# Patient Record
Sex: Male | Born: 1935 | Race: White | Hispanic: No | Smoking: Former smoker
Health system: Southern US, Community
[De-identification: ages and names within clinical notes are randomized; demographics above are authoritative.]

## PROBLEM LIST (undated history)

## (undated) DIAGNOSIS — Z9889 Other specified postprocedural states: Secondary | ICD-10-CM

## (undated) DIAGNOSIS — K5900 Constipation, unspecified: Secondary | ICD-10-CM

## (undated) DIAGNOSIS — C801 Malignant (primary) neoplasm, unspecified: Secondary | ICD-10-CM

## (undated) DIAGNOSIS — Z9989 Dependence on other enabling machines and devices: Secondary | ICD-10-CM

## (undated) DIAGNOSIS — N183 Chronic kidney disease, stage 3 unspecified: Secondary | ICD-10-CM

## (undated) DIAGNOSIS — M4804 Spinal stenosis, thoracic region: Secondary | ICD-10-CM

## (undated) DIAGNOSIS — I340 Nonrheumatic mitral (valve) insufficiency: Secondary | ICD-10-CM

## (undated) DIAGNOSIS — I714 Abdominal aortic aneurysm, without rupture: Secondary | ICD-10-CM

## (undated) DIAGNOSIS — I5032 Chronic diastolic (congestive) heart failure: Secondary | ICD-10-CM

## (undated) DIAGNOSIS — Z8679 Personal history of other diseases of the circulatory system: Secondary | ICD-10-CM

## (undated) DIAGNOSIS — Z87828 Personal history of other (healed) physical injury and trauma: Secondary | ICD-10-CM

## (undated) DIAGNOSIS — I341 Nonrheumatic mitral (valve) prolapse: Secondary | ICD-10-CM

## (undated) DIAGNOSIS — G4733 Obstructive sleep apnea (adult) (pediatric): Secondary | ICD-10-CM

## (undated) DIAGNOSIS — I4891 Unspecified atrial fibrillation: Secondary | ICD-10-CM

## (undated) DIAGNOSIS — H9190 Unspecified hearing loss, unspecified ear: Secondary | ICD-10-CM

## (undated) DIAGNOSIS — M1990 Unspecified osteoarthritis, unspecified site: Secondary | ICD-10-CM

## (undated) DIAGNOSIS — N529 Male erectile dysfunction, unspecified: Secondary | ICD-10-CM

## (undated) HISTORY — DX: Chronic kidney disease, stage 3 (moderate): N18.3

## (undated) HISTORY — DX: Chronic kidney disease, stage 3 unspecified: N18.30

## (undated) HISTORY — DX: Dependence on other enabling machines and devices: Z99.89

## (undated) HISTORY — DX: Nonrheumatic mitral (valve) prolapse: I34.1

## (undated) HISTORY — DX: Nonrheumatic mitral (valve) insufficiency: I34.0

## (undated) HISTORY — DX: Abdominal aortic aneurysm, without rupture: I71.4

## (undated) HISTORY — DX: Unspecified atrial fibrillation: I48.91

## (undated) HISTORY — PX: HEMORRHOID SURGERY: SHX153

## (undated) HISTORY — PX: CARPAL TUNNEL RELEASE: SHX101

## (undated) HISTORY — DX: Obstructive sleep apnea (adult) (pediatric): G47.33

## (undated) HISTORY — DX: Spinal stenosis, thoracic region: M48.04

## (undated) HISTORY — DX: Male erectile dysfunction, unspecified: N52.9

## (undated) HISTORY — PX: OTHER SURGICAL HISTORY: SHX169

---

## 2001-06-01 ENCOUNTER — Ambulatory Visit (HOSPITAL_COMMUNITY): Admission: RE | Admit: 2001-06-01 | Discharge: 2001-06-01 | Payer: Self-pay | Admitting: Gastroenterology

## 2004-07-09 ENCOUNTER — Encounter: Admission: RE | Admit: 2004-07-09 | Discharge: 2004-07-09 | Payer: Self-pay | Admitting: Otolaryngology

## 2004-07-09 ENCOUNTER — Other Ambulatory Visit: Admission: RE | Admit: 2004-07-09 | Discharge: 2004-07-09 | Payer: Self-pay | Admitting: Otolaryngology

## 2004-07-16 ENCOUNTER — Encounter: Admission: RE | Admit: 2004-07-16 | Discharge: 2004-07-16 | Payer: Self-pay | Admitting: Otolaryngology

## 2004-07-18 ENCOUNTER — Ambulatory Visit: Admission: RE | Admit: 2004-07-18 | Discharge: 2004-09-04 | Payer: Self-pay | Admitting: Radiation Oncology

## 2004-08-01 ENCOUNTER — Encounter (INDEPENDENT_AMBULATORY_CARE_PROVIDER_SITE_OTHER): Payer: Self-pay | Admitting: Specialist

## 2004-08-01 ENCOUNTER — Inpatient Hospital Stay (HOSPITAL_COMMUNITY): Admission: RE | Admit: 2004-08-01 | Discharge: 2004-08-04 | Payer: Self-pay | Admitting: Otolaryngology

## 2004-10-01 ENCOUNTER — Observation Stay (HOSPITAL_COMMUNITY): Admission: RE | Admit: 2004-10-01 | Discharge: 2004-10-02 | Payer: Self-pay | Admitting: Orthopedic Surgery

## 2004-10-29 ENCOUNTER — Encounter: Admission: RE | Admit: 2004-10-29 | Discharge: 2004-10-29 | Payer: Self-pay | Admitting: Otolaryngology

## 2005-04-08 ENCOUNTER — Encounter: Admission: RE | Admit: 2005-04-08 | Discharge: 2005-04-08 | Payer: Self-pay | Admitting: Otolaryngology

## 2005-09-17 ENCOUNTER — Encounter: Admission: RE | Admit: 2005-09-17 | Discharge: 2005-09-17 | Payer: Self-pay | Admitting: Otolaryngology

## 2006-03-29 ENCOUNTER — Encounter: Admission: RE | Admit: 2006-03-29 | Discharge: 2006-03-29 | Payer: Self-pay | Admitting: Otolaryngology

## 2006-07-15 ENCOUNTER — Ambulatory Visit (HOSPITAL_COMMUNITY): Admission: RE | Admit: 2006-07-15 | Discharge: 2006-07-15 | Payer: Self-pay | Admitting: General Surgery

## 2006-11-24 ENCOUNTER — Encounter: Admission: RE | Admit: 2006-11-24 | Discharge: 2006-11-24 | Payer: Self-pay | Admitting: Otolaryngology

## 2006-11-26 ENCOUNTER — Ambulatory Visit (HOSPITAL_COMMUNITY): Admission: RE | Admit: 2006-11-26 | Discharge: 2006-11-27 | Payer: Self-pay | Admitting: General Surgery

## 2008-04-30 ENCOUNTER — Encounter: Admission: RE | Admit: 2008-04-30 | Discharge: 2008-04-30 | Payer: Self-pay | Admitting: Otolaryngology

## 2010-12-14 ENCOUNTER — Encounter: Payer: Self-pay | Admitting: Otolaryngology

## 2011-01-14 ENCOUNTER — Other Ambulatory Visit: Payer: Self-pay

## 2011-04-10 NOTE — H&P (Signed)
NAME:  SAW, PERELLI                         ACCOUNT NO.:  [id number]   MEDICAL RECORD NO.:  [id number]                   PATIENT TYPE:  INP   LOCATION:  2550                                 FACILITY:  [facility]   PHYSICIAN:  Ileene Hutchinson T. Erik Obey, M.D.              DATE OF BIRTH:  [birth year]   DATE OF ADMISSION:  08/01/2004  DATE OF DISCHARGE:                                HISTORY & PHYSICAL   CHIEF COMPLAINT:  Left cheek mass.   HISTORY OF PRESENT ILLNESS:  A 77 year old white male had a lesion come up  on his left temporal skin and also a lump in his left parotid region  approximately three months ago.  He had resection of the temple lesion which  was diagnosed as squamous cell carcinoma.  This was resected by Dr.  Link Snuffer using Mose technique.  He attempted to do a punch biopsy of the  parotid mass which failed to yield diagnostic information.  He was sent our  way for further interpretation.  The mass is nontender.  It may be slowly  growing at most.  No other neck masses.  No history of any tumors elsewhere  on his face or neck.  A needle aspiration was positive for squamous cell  carcinoma.  A CT scan demonstrated no additional adenopathy.  A chest x-ray  was negative.   PAST MEDICAL HISTORY:  1.  No known allergies.  2.  He is taking Lovastatin and Motrin.  3.  He has had carpal tunnel surgery and also several skin cancers removed.  4.  He has no current active medical conditions.   SOCIAL HISTORY:  He is married and retired.  He does not smoke and drinks  only occasionally.   FAMILY HISTORY:  Noncontributory.   REVIEW OF SYSTEMS:  Noncontributory.   PHYSICAL EXAMINATION:  GENERAL:  This is a trim, healthy-appearing, middle-  aged white male in no distress.  SKIN:  He has a well healed left temporal skin scar consistent with  resection of skin cancer.  HEENT:  He has a palpable 2-cm mass in the mid left parotid.  Facial nerve  is intact.  No palpable neck nodes.  The  pharynx is clear.  LUNGS:  Clear and unlabored.  HEART:  Regular rate and rhythm and no murmurs.  ABDOMEN:  Soft and active.  EXTREMITIES:  Normal configuration.   ADMISSION LABORATORY STUDIES:  Include a hematocrit of 41.0, hemoglobin 14,  white blood cell count 5.3 thousand, platelet count 218,000.  PT and PTT  within normal limits.  Potassium 4.5, sodium 143, glucose 71.  Liver  function is intact.  Urinalysis negative.   Cardiogram revealed sinus bradycardia with no other abnormalities.   Chest x-ray showed a slightly prominent left pulmonary hilum, possibly  pulmonary artery.  This will be followed.   ADMISSION DIAGNOSIS:  Squamous cell carcinoma, metastatic, to a left parotid  node.  PLAN:  He is admitted for a left superficial parotidectomy with facial nerve  preservation and supraomohyoid neck dissection for staging and treatment.  Depending on the findings from the path report, he may also require adjuvant  postoperative radiation therapy.                                                Marikay Alar. Erik Obey, M.D.    KTW/MEDQ  D:  08/01/2004  T:  08/01/2004  Job:  [id number]   cc:   Link Snuffer, Dr.   Delanna Ahmadi, M.D.  [address] [place] [address]  Fax: [place]. Tammi Klippel, M.D.  [address] [address]  Fax: [phone]   chart

## 2011-04-10 NOTE — Procedures (Signed)
[place]  Patient:    [name], [name]                        MRN: [id number] Proc. Date: 06/01/01 Attending:  Cleotis Nipper, M.D. CC:         Delanna Ahmadi, M.D.   Procedure Report  PROCEDURE PERFORMED:  Colonoscopy.  ENDOSCOPIST:  Cleotis Nipper, M.D.  INDICATIONS FOR PROCEDURE:  Screening for colon cancer in a 77 year old gentleman without worrisome symptoms of family history.  FINDINGS:  Remarkably normal exam to the cecum.  DESCRIPTION OF PROCEDURE:  The nature, purpose and risks of the procedure had been discussed with the patient, who provided written consent.  Digital exam demonstrated the presence of some hypertrophied anal papillae.  The prostate gland felt normal.  The adult Olympus video colonoscope was quite easily advanced to the cecum and for a short distance into a normal-appearing terminal ileum, whereupon pullback was initiated.  The quality of the prep was very good and it is felt that all areas were well seen.  This was a normal examination.  There was what appeared to be a hyperplastic polyp in the rectosigmoid region which basically flattened out with insufflation, so I did not biopsy it.  There were no other polyps, or any evidence of cancer, colitis, vascular malformations or diverticulosis. Retroflexion in the rectum confirmed the presence of hypertrophied anal papillae and some internal hemorrhoids.  These were also seen during pullout through the anal canal.  No biopsies were obtained.  The patient tolerated the procedure well.  There were no apparent complications.  IMPRESSION:  Normal screening colonoscopy.  PLAN: Consider screening and sigmoidoscopic evaluation in five years to be followed annually by hemoccults thereafter with perhaps a full colonoscopy in 10 years from now if the patient remains in good medical health.DD:  06/01/01 TD:  06/01/01 Job: [id number]

## 2011-04-10 NOTE — Op Note (Signed)
NAME:  [name], [name]                         ACCOUNT NO.:  [id number]   MEDICAL RECORD NO.:  [id number]                   PATIENT TYPE:  INP   LOCATION:  5736                                 FACILITY:  [facility]   PHYSICIAN:  Ileene Hutchinson T. Erik Obey, M.D.              DATE OF BIRTH:  [birth year]   DATE OF PROCEDURE:  DATE OF DISCHARGE:                                 OPERATIVE REPORT   DATE OF OPERATION:  August 01, 2004.   PREOPERATIVE DIAGNOSES:  Squamous cell carcinoma of the left temple skin  metastatic to the left parotid node.   POSTOPERATIVE DIAGNOSES:  Squamous cell carcinoma of the left temple skin  metastatic to the left parotid node.   PROCEDURE PERFORMED:  Left superficial parotidectomy with facial nerve  preservation, left supraomohyoid modified neck dissection.   SURGEON:  Marikay Alar. Erik Obey, MD.   ASSISTANT:  Melissa Montane, MD.   ANESTHESIA:  General orotracheal.   BLOOD LOSS:  Less than 100 mL.   COMPLICATIONS:  None.   FINDINGS:  A 2 cm firm mid parotid node in the lateral lobe.  A wide isthmus  to the parotid gland.  Several smaller jugular nodes, which were not firm.   PROCEDURE:  With the patient in a comfortable supine position, general  orotracheal anesthesia was induced without difficulty.  At an appropriate  level, the table was turned away from Anesthesia and the patient was placed  in a slight reverse Trendelenburg.  The head was rotated towards the right  for access to the left neck.  The neck was palpated, with the findings as  described above.  A previous skin wrinkle was chosen for the incisional  site, which was infiltrated with 10 mL of 1% Xylocaine with 1:100,000  epinephrine.  A sterile preparation and draping of the entire left face and  neck was accomplished.   The neck was again palpated, with the findings as described above.  The  incision was executed transversely along a wrinkle in the left neck and then  in a modified Blair fashion in front  of the left ear and down behind the  angle of the mandible.  A small ellipse of skin was taken where the previous  punch biopsy was attempted by Dr. Link Snuffer and left intact with the  specimen.  An anterior flap was elevated in the periparotid plane.  Subplatysmal flaps were elevated in the neck up to the ramus of the mandible  and down to the level of the omohyoid muscle.  It was elected to do the neck  dissection first.   After elevating the flaps, a nerve stimulator was used to identify the ramus  mandibularis nerve along the ramus of the mandible on the left side.  This  was carefully dissected up above the level of the ramus of the mandible and  back into the parotid itself.  Using this for orientation, the facial artery  and vein were identified, controlled, divided and ligated at the ramus of  the mandible on the left side.  The fascia was incised using electrocautery  along the ramus of the mandible.  It was carried down along the anterior  belly of the digastric, and the soft tissue in between the bellies of the  digastric was dissected and removed and rolled towards the left neck.  Working under the ramus of the mandible, the submandibular gland was  identified and was sharply and bluntly dissected.  The lingual nerve was  identified and the submandibular ganglion was divided and then ligated.  The  soft tissue deep to the mylohyoid muscle was dissected and the duct was  identified and ligated, then divided.  The hypoglossal nerve was identified,  but was not further explored at this time.  Care was taken to avoid trauma  to the ranine veins.  The submandibular gland was carried down and remained  pedicled by its vessels to the jugular and carotid vessels.   At this point, the fascia of the lateral sternocleidomastoid muscle was  grasped and dissected away from the lateral surface of the muscle and  wrapped around the front side and onto the medial surface.  The spinal   accessory nerve was identified.  The nerve was tracked up towards the  jugular vein, and the soft tissue lateral to the nerve was divided.  Soft  tissue was dissected from the medial surface of the sternocleidomastoid  muscle and then from the lateral surface of the splenius and levator muscles  and from the floor of the posterior triangle.  This final accessory nerve  was further freed from the soft tissue using a sharp #15 blade, and the  tissue was passed under the spinal accessory nerve.  Working beneath the  sternocleidomastoid muscle, using the cervical nerve roots as the guide for  the posterior limit of the dissection, the soft tissue was rolled off of the  muscular floor anteriorly.  The dissection was carried down to the level of  the omohyoid muscle.   At this time, the fascia along the omohyoid muscle was lysed, carried up,  and communicated with the anterior dissection, and this tissue was rolled  off of the strap muscles posteriorly towards the neck dissection.   Working in the floor of the posterior triangle, the dissection was carried  up off of the carotid artery with preservation of the vagus nerve, and then  using blunt and sharp dissection, the fascial layer was carefully unwrapped  for 360 degrees around the jugular vein from inferiorly upwards.  The  specimen was rolled forward away from the jugular vein.  Several branches  were controlled with silk ligature.  There was a very prominent anterior  jugular/anterior facial vein, which was also ligated inferiorly using a 2-0  silk suture ligature.  The jugular vein was left intact.  At this point, the  soft tissue of the neck dissection was dissected from the carotid vessels  and their branches and carried upward to remain in continuity with the tail  of the parotid gland.  This completed the neck dissection.   The plane was developed between the external ear canal and the parotid using the Shaw scalpel, and the parotid  was retracted forward.  This was carried  on a broad front from the digastric muscle into the tympanomastoid groove  and up along the bony canal.  Small vessels were controlled with silk  ligature, but otherwise hemostasis was  achieved with bipolar cautery or with  the Shaw scalpel.  Blunt dissection identified the facial nerve.  Working  along the facial nerve using the [employer], the parotid tissue  lateral to the nerve and inferiorly was lysed communicating with the level  of the neck dissection.  The parotid was dissected free from the medial and  anterior surface of the digastric muscle.  The lower branch was dissected  forward for a degree.  The pes anserinus was identified.  The superior  branches were carefully dissected outward.  The superficial temporal vein  was encountered and ligated to allow the tissue to roll forward.  The nerve  branches were dissected to the periphery and then the dissection was carried  around the branches forward, down to the level of the masseter muscle.  There were several branches in close apposition to the tumor, but they  dissected clear easily and no deliberate nerve sacrifice was performed.  A  secondary buckle branch anteriorly was observed to have been divided and the  stump was identified, and this was later reapproximated with individual  sutures of 6-0 Prolene.  At this point, the dissection was continued along  the nerve branches, and the ramus was identified from the main trunk and  carried out to the previously dissected nerve from the neck dissection.  The  gland was rolled downward to communicate with the neck specimen, and finally  was dissected away from the jugular vein, the carotid artery, and was  delivered.  Orienting sutures were placed in the specimen, and it was sent  to Pathology for permanent interpretation.  A small additional quantity of  cautery rendered the wound hemostatic.  The wound was thoroughly irrigated  and  suctioned clean.  Valsalva failed to reveal any additional bleeding  sites.  A 7 mm flat drain was brought out through a separate low anterior  neck puncture and draped up into the entire wound.  It was secured to the  skin with a 3-0 nylon stitch.  At this point, hemostasis was observed in the  left neck.  The flaps were laid back down.  The posterior corner of the  modified Blair incision was looking somewhat dusky and 1 cm was resected  only from the skin tip.  The skin was reapproximated in a cosmetic fashion,  using preoperative hash marks with interrupted 3-0 chromic suture.  The skin  of the neck was finally closed with interrupted staples, and the  preauricular skin was closed with a running simple 5-0 Ethilon suture.  The  drain was placed to suction and observed to be functioning without air leak.  At this point, the procedure was completed.  The patient was returned to  Anesthesia, awakened, extubated, and transferred to recovery in stable  condition.  COMMENT:  A 77 year old white male with a 26-month history of a left temple  lesion and a left parotid mass, with the temple lesion having been  completely resected using Moe's technique by Dr. Link Snuffer, and with the  parotid mass being positive by needle aspiration for squamous cell  carcinoma, presumed metastatic; hence, the indication for today's procedure.  We will observe him for routine postoperative recovery including ice and  elevation.  We will await the final pathology report.  He would probably  need radiation therapy if he had multiple positive nodes, extracapsular  extension, or any signs of other complications such as perineural spread.  Marikay Alar. Erik Obey, M.D.    KTW/MEDQ  D:  08/01/2004  T:  08/02/2004  Job:  [id number]   cc:   Dr. Link Snuffer ???   Delanna Ahmadi, M.D.  [address] [place] [address]  Fax: [place]. Tammi Klippel, M.D.   [address] [place] [address]  Fax: [phone]

## 2011-04-10 NOTE — Op Note (Signed)
[name], [name]               ACCOUNT NO.:  [id number]   MEDICAL RECORD NO.:  [id number]          PATIENT TYPE:  AMB   LOCATION:  DAY                          FACILITY:  [facility]   PHYSICIAN:  Edsel Petrin. Dalbert Batman, M.D.DATE OF BIRTH:  [birth year]   DATE OF PROCEDURE:  DATE OF DISCHARGE:                               OPERATIVE REPORT   PREOPERATIVE DIAGNOSIS:  Bilateral inguinal hernias.   POSTOPERATIVE DIAGNOSIS:  Bilateral inguinal hernias.   OPERATION PERFORMED:  Laparoscopic preperitoneal repair of bilateral  inguinal hernias with polypropylene mesh.   SURGEON:  Dr. Fanny Skates   OPERATIVE INDICATIONS:  This is a 77 year old white man who has had a  right inguinal hernia for some time.  It has become larger and more  painful recently.  On exam he has a moderate-sized right inguinal hernia  and a smaller left inguinal hernia.  He has been counseled as an  outpatient.  He is brought to the operating room electively for repair  of his bilateral inguinal hernias.   OPERATIVE TECHNIQUE:  Following induction of general endotracheal  anesthesia, the patient's abdomen was prepped and draped in a sterile  fashion.  We also prepped the genitalia.  Intravenous antibiotics were  given prior to the incision.  Next 0.5% Marcaine with epinephrine was  used as local infiltration anesthetic.  A curved transverse incision was  made below the umbilicus.  The fascia was incised transversely exposing  the linea alba and the medial border of the right rectus muscle.  With  blunt dissection, we created a space behind the right rectus muscle and  the right rectus sheath.  The dissector balloon was inserted in the  right rectus sheath in the midline down to the symphysis pubis.  The  video camera was inserted.  The dissector balloon was inflated under  direct vision with air.  We had good deployment of the balloon  bilaterally, exposing the rectus muscles anteriorly, symphysis pubis  inferiorly,  and we could see the inferior epigastric vessels on both  sides.  We held the balloon in place for about 5 minutes.  We then  deflated the balloon and removed it.  The balloon to seal the fascia was  then deflated and secured with the trocar securing device.  This was  connected to the insufflator at 12 mmHg.  Insufflation was good.  We had  good visualization bilaterally.  We had to place a 5-mm trocar in the  infraumbilical area to dissect the peritoneum on the right and the left  side to get it away from the muscle.  We then inserted a 5-mm trocar in  the right lower quadrant and a 5-mm trocar in the left lower quadrant  under direct vision.   We once again identified the symphysis pubis and the Cooper's ligament  on both sides.  We identified a moderately large direct hernia sac on  the right and dissected it away from some of the underlying tissues and  allowed it to retract.  On the left side, we found a smaller direct  hernia sac which was easy to dissect  away.  We then dissected the cord  structures on both sides.  I did not find any evidence of an indirect  hernia on either side.  I did identify the edge of the peritoneum on  both sides and made sure that it was dissected superiorly back to above  the level of the anterior superior iliac spine.  We made a small opening  in the peritoneum on the left side which was created pneumoperitoneum,  which was ultimately released with a Veress needle in the right upper  quadrant.   I repaired both hernias, each side being repaired with a large size Bard  3-D Max mesh.  I first repaired the right side and then the left side.  We positioned the mesh carefully so as to overlap the midline slightly  and overlap the Cooper's ligament inferiorly slightly.  The mesh was  secured and fixated with 5-mm tacks along the superior border of the  Cooper's ligament, up along the midline.  We then secured the mesh  laterally above the iliopubic tract.   Laterally we were very careful to  be sure that we could palpate the tacker through the abdominal wall to  stay above the iliopubic tract.  We placed tacks superiorly to hold the  mesh on each side in place.  Once this was done, the mesh on both sides  was in good position covering both direct and indirect spaces with good  fixation and no bleeding.  Pneumoperitoneum was released.  The trocars  were removed.  The fascia at the umbilicus was closed with two  interrupted figure-of-eight sutures of 0 Vicryl.  Skin incisions were  closed with subcuticular sutures of 4-0 Monocryl and Steri-Strips.  Clean bandages were placed.  The patient was taken to the recovery room  in stable condition.   ESTIMATED BLOOD LOSS:  About 15 mL.   COMPLICATIONS:  None.   Sponge and instrument counts were correct.      Edsel Petrin. Dalbert Batman, M.D.  Electronically Signed     HMI/MEDQ  D:  11/26/2006  T:  11/26/2006  Job:  [id number]   cc:   Delanna Ahmadi, M.D.  Fax: [phone]

## 2011-04-10 NOTE — Discharge Summary (Signed)
[name], [name]               ACCOUNT NO.:  [id number]   MEDICAL RECORD NO.:  [id number]          PATIENT TYPE:  INP   LOCATION:  5736                         FACILITY:  [facility]   PHYSICIAN:  Ileene Hutchinson T. Erik Obey, M.D. DATE OF BIRTH:  02-15-1929   DATE OF ADMISSION:  08/01/2004  DATE OF DISCHARGE:  08/04/2004                                 DISCHARGE SUMMARY   CHIEF COMPLAINT:  Left cheek mass.   HISTORY OF PRESENT ILLNESS:  A 77 year old white male had a lesion come up  on his left temporal skin and also a lump in his left parotid region  approximately three months ago.  He had a resection of the temporal lesion  which was diagnosed as squamous cell carcinoma.  This was resected by Dr.  Link Snuffer using the Mohs technique.  He attempted to do a punch biopsy of  the parotid mass which failed to yield diagnostic information.  The patient  was sent our way for further interpretation.  The mass is nontender but may  be slowly growing.  No other neck masses.  No history of tumors elsewhere on  the face or neck.  The needle aspiration of the node was positive for  squamous cell carcinoma.  A CT scan of the head and neck revealed no  additional adenopathy.  Chest x-ray was clear.   ALLERGIES:  No known drug allergies.   MEDICATIONS:  1.  Lovastatin.  2.  Motrin.   PAST MEDICAL HISTORY:  1.  He has had previous skin cancers removed.  2.  Carpal tunnel surgery.  3.  No active medical conditions.   SOCIAL HISTORY:  He is married and retired.  He does not smoke and drinks  only occasionally.   FAMILY HISTORY:  Noncontributory.   REVIEW OF SYMPTOMS:  Noncontributory.   PHYSICAL EXAMINATION:  GENERAL APPEARANCE:  This is a trim, healthy-  appearing middle aged white male in no distress.  HEENT:  He has a well-healed left temporal skin scar consistent with  resection of skin cancer.  He has a 2 cm mid left parotid mass with intact  facial nerve.  No palpable neck nodes.  Nose and  pharynx clear.  LUNGS:  Clear and unlabored.  CARDIOVASCULAR:  Regular rate and rhythm with no murmurs.  ABDOMEN:  Soft and active.  EXTREMITIES:  Normal configuration.   ADMISSION LABORATORY DATA:  Hematocrit 41, hemoglobin 14, white blood cell  count 5.3, platelet count 218.  Electrolytes and coagulation studies  normal.  Urinalysis normal.   EKG revealed sinus bradycardia.  Chest x-ray showed a prominent left  pulmonary hilum felt to be a pulmonary artery prominence.   ADMISSION DIAGNOSIS:  Squamous cell carcinoma metastatic to left parotid  node.   HOSPITAL COURSE:  On the day of his admission, the patient was taken to the  operating room where he underwent a left superficial parotidectomy with  facial nerve preservation and a left supraomohyoid neck dissection.  Surgery  went well with findings including a parotid node but no other palpable nodes  of significance.  He was observed afterwards with  stable vital signs,  minimal pain, small amounts of drainage and good resumption of drainage and  good resumption of activity and diet.  He did have some facial weakness.   On the third postoperative day, the drain producing minimal exudate, the  drain was discontinued.  He was discharged to his home in the care of his  family.  We will await the final pathology report to determine whether he  should need postoperative adjuvant radiation therapy.   DIAGNOSIS:  Squamous cell carcinoma metastatic to left parotid node.   PROCEDURE PERFORMED:  Left superficial parotidectomy with conservation neck  dissection August 01, 2004.   COMPLICATIONS:  None.   CONDITION ON DISCHARGE:  Ambulatory, drain out, pain controlled, eating  well.   FOLLOW UP:  Return visit in one week.   PRESCRIPTIONS:  None.      [place]   KTW/MEDQ  D:  10/06/2004  T:  10/06/2004  Job:  [id number]   cc:   Dr. Amparo Bristol, M.D.  [address] [place] [address]  Fax:  [phone]

## 2011-04-10 NOTE — Op Note (Signed)
NAMETENNYSON, SIMONE               ACCOUNT NO.:  [id number]   MEDICAL RECORD NO.:  [id number]          PATIENT TYPE:  AMB   LOCATION:  ENDO                         FACILITY:  [facility]   PHYSICIAN:  Tarri Glenn, M.D.  DATE OF BIRTH:  [birth year]   DATE OF PROCEDURE:  10/01/2004  DATE OF DISCHARGE:                                 OPERATIVE REPORT   PREOPERATIVE DIAGNOSES:  1.  Degenerative arthritis acromioclavicular joint.  2.  Chronic impingement syndrome with partial versus complete rotator cuff      tear, right shoulder.   POSTOPERATIVE DIAGNOSES:  1.  Degenerative arthritis right acromioclavicular joint.  2.  Labral degeneration and partial tearing.  3.  Partial rotator cuff tear, right shoulder.   OPERATION:  1.  Right shoulder arthroscopy with:      1.  Debridement of labrum and underneath surface of rotator cuff partial          tear.      2.  Arthroscopic subacromial decompression.  2.  Open resection distal right clavicle.   SURGEON:  Dr. Shellia Carwin   ASSISTANT:  Mr. Delorse Lek, [place]   ANESTHESIA:  General.   PATHOLOGY AND JUSTIFICATION FOR PROCEDURE:  He has had several injuries to  his right shoulder with a more recent one being several months ago and  leading to an MRI on August 29, 2004, which showed the above findings  discussed on pathology.  The plan at this time was to perform open resection  of distal clavicle and arthroscopic SAD with possible open rotator cuff  repair if indicated.   DESCRIPTION OF PROCEDURE:  Prophylactic antibiotics, satisfactory general  anesthesia, beach chair position on the Schlein frame.  The right shoulder  girdle was prepped with DuraPrep and draped in a sterile field.  The anatomy  of the shoulder joint was marked out.  He had had an interscalene block  preoperatively, but I still used Marcaine with adrenalin for hemostasis,  injecting lateral and posterior portals in the subacromial space.  Through a  posterior soft spot  portal, I atraumatically entered the glenohumeral joint  with a blunt trocar.  On inspection, we found a good bit of disruption of  the labrum near the biceps anchor and anterior to it.  There was no frank  tear.  He also had fairly good disruption of the underneath surface of the  rotator cuff.  There did not appear to be any extravasation fluid in the  subacromial space, however, during this portion of the procedure.  Accordingly, I advanced the scope into the anterior capsule, used a  switching stick, and made an anterior incision.  Over this, I placed a metal  cannula, followed by 4.2 shaver and debrided down the labrum and the  underneath surface of the rotator cuff.  I then expressed all fluid possible  from the glenohumeral joint and redirected the scope in the subacromial  space, through a lateral portal used a blunt trocar followed by 4.2 shaver.  He had a large amount of bursal tissue and some roughening of the rotator  cuff, most of which I  removed with the 4.2 shaver.  I then introduced the 90-  degree ArthroCare vaporizer and began removing soft tissue from the  underneath surface of the acromion, around the anterior portion, and down to  the [facility] joint.  I then used a 4-0 oval bur to begin burring the underneath  surface of the acromion, went back and forth between the bur and the  vaporizer until there was wide decompression, as evidenced by using the  vaporizer as a guide with the arm to the side and the arm abducted.  When I  felt that the decompression had been completed and I had smoothed down the  rotator cuff, I then removed all fluid I could from the subacromial space as  well, and I made an open incision over the distal clavicle and with  subperiosteal dissection, exposed the very degenerated AC joint, and  measured 1.5 cm medial to it, made the scribe line on the clavicle.  I then  undermined the clavicle at this point and protecting the underneath tissues  with baby  Bennetts, used a microsaw to cut the bone.  I then removed it with  towel clip and cautery technique.  Checked for remaining spicules on the  parent clavicle, and there were none.  I used some bone wax to cover the raw  bone, irrigated the wound well and packed the resection space with Gelfoam.  Then closed the fascia over the resectioned area with interrupted #1 Vicryl,  subcutaneous tissue with 2-0 Vicryl with Steri-Strips on the skin and 4-0  nylon in the three portals.  Betadine Adaptic were applied to the portals,  dry sterile dressing to the entire shoulder followed by a shoulder  immobilizer.  He tolerated the procedure well and was taken to the recovery  room in satisfactory condition with no known complications.      JA/MEDQ  D:  10/01/2004  T:  10/01/2004  Job:  [id number]

## 2011-08-25 ENCOUNTER — Other Ambulatory Visit: Payer: Self-pay | Admitting: Dermatology

## 2012-01-21 ENCOUNTER — Other Ambulatory Visit: Payer: Self-pay | Admitting: Gastroenterology

## 2012-03-17 ENCOUNTER — Other Ambulatory Visit: Payer: Self-pay | Admitting: Surgery

## 2012-06-10 ENCOUNTER — Other Ambulatory Visit: Payer: Self-pay | Admitting: Orthopedic Surgery

## 2012-06-13 ENCOUNTER — Encounter (HOSPITAL_COMMUNITY): Payer: Self-pay

## 2012-06-13 ENCOUNTER — Encounter (HOSPITAL_COMMUNITY): Payer: Self-pay | Admitting: Pharmacy Technician

## 2012-06-13 ENCOUNTER — Encounter (HOSPITAL_COMMUNITY)
Admission: RE | Admit: 2012-06-13 | Discharge: 2012-06-13 | Disposition: A | Discharge status: Home / Self Care | Payer: Medicare Other | Source: Ambulatory Visit | Attending: Orthopedic Surgery | Admitting: Orthopedic Surgery

## 2012-06-13 HISTORY — DX: Unspecified hearing loss, unspecified ear: H91.90

## 2012-06-13 HISTORY — DX: Constipation, unspecified: K59.00

## 2012-06-13 HISTORY — DX: Personal history of other (healed) physical injury and trauma: Z87.828

## 2012-06-13 HISTORY — DX: Unspecified osteoarthritis, unspecified site: M19.90

## 2012-06-13 HISTORY — DX: Malignant (primary) neoplasm, unspecified: C80.1

## 2012-06-13 LAB — BASIC METABOLIC PANEL
BUN: 22 mg/dL (ref 6–23)
CO2: 26 mEq/L (ref 19–32)
Chloride: 103 mEq/L (ref 96–112)
GFR calc Af Amer: 59 mL/min — ABNORMAL LOW (ref >90)
GFR calc non Af Amer: 51 mL/min — ABNORMAL LOW (ref >90)
Glucose, Bld: 87 mg/dL (ref 70–99)
Potassium: 4.3 mEq/L (ref 3.5–5.1)
Sodium: 138 mEq/L (ref 135–145)

## 2012-06-13 LAB — CBC
HCT: 37.7 % — ABNORMAL LOW (ref 39.0–52.0)
Hemoglobin: 12.7 g/dL — ABNORMAL LOW (ref 13.0–17.0)
MCH: 30.5 pg (ref 26.0–34.0)
MCHC: 33.7 g/dL (ref 30.0–36.0)
RDW: 12.5 % (ref 11.5–15.5)

## 2012-06-13 LAB — SURGICAL PCR SCREEN
MRSA, PCR: NEGATIVE
Staphylococcus aureus: NEGATIVE

## 2012-06-13 NOTE — Pre-Procedure Instructions (Signed)
CBC, BMET AND EKG WERE DONE TODAY AT [facility] PREOP - HX OF HEART PALPITATIONS IN THE PAST.  CXR NOT NEEDED PER ANESTHESIOLOGIST'S GUIDELINES. PREOP TEACHING WAS DONE USING THE TEACH BACK METHOD.

## 2012-06-13 NOTE — Patient Instructions (Signed)
YOUR SURGERY IS SCHEDULED ON:  Wednesday  7/24 AT 10:30 AM  REPORT TO [place] [facility]AY [facility] AT:  8:30 AM      PHONE # FOR SHORT STAY IS [phone]  DO NOT EAT OR DRINK ANYTHING AFTER MIDNIGHT THE NIGHT BEFORE YOUR SURGERY.  YOU MAY BRUSH YOUR TEETH, RINSE OUT YOUR MOUTH--BUT NO WATER, NO FOOD, NO CHEWING GUM, NO MINTS, NO CANDIES, NO CHEWING TOBACCO.  PLEASE TAKE THE FOLLOWING MEDICATIONS THE AM OF YOUR SURGERY WITH A FEW SIPS OF WATER:  NO MEDICATIONS TO TAKE THE AM OF YOUR SURGERY    IF YOU USE INHALERS--USE YOUR INHALERS THE AM OF YOUR SURGERY AND BRING INHALERS TO [place].    IF YOU ARE DIABETIC:  DO NOT TAKE ANY DIABETIC MEDICATIONS THE AM OF YOUR SURGERY.  IF YOU TAKE INSULIN IN THE EVENINGS--PLEASE ONLY TAKE 1/2 NORMAL EVENING DOSE THE NIGHT BEFORE YOUR SURGERY.  NO INSULIN THE AM OF YOUR SURGERY.  IF YOU HAVE SLEEP APNEA AND USE CPAP OR BIPAP--PLEASE BRING THE MASK --NOT THE MACHINE-NOT THE TUBING   -JUST THE MASK. DO NOT BRING VALUABLES, MONEY, CREDIT CARDS.  CONTACT LENS, DENTURES / PARTIALS, GLASSES SHOULD NOT BE WORN TO SURGERY AND IN MOST CASES-HEARING AIDS WILL NEED TO BE REMOVED.  BRING YOUR GLASSES CASE, ANY EQUIPMENT NEEDED FOR YOUR CONTACT LENS. FOR PATIENTS ADMITTED TO THE HOSPITAL--CHECK OUT TIME THE DAY OF DISCHARGE IS 11:00 AM.  ALL INPATIENT ROOMS ARE PRIVATE - WITH BATHROOM, TELEPHONE, TELEVISION AND WIFI INTERNET. IF YOU ARE BEING DISCHARGED THE SAME DAY OF YOUR SURGERY--YOU CAN NOT DRIVE YOURSELF HOME--AND SHOULD NOT GO HOME ALONE BY TAXI OR BUS.  NO DRIVING OR OPERATING MACHINERY FOR 24 HOURS FOLLOWING ANESTHESIA / PAIN MEDICATIONS.                            SPECIAL INSTRUCTIONS:  CHLORHEXIDINE SOAP SHOWER (other brand names are Betasept and Hibiclens ) PLEASE SHOWER WITH CHLORHEXIDINE THE NIGHT BEFORE YOUR SURGERY AND THE AM OF YOUR SURGERY. DO NOT USE CHLORHEXIDINE ON YOUR FACE OR PRIVATE AREAS--YOU MAY USE YOUR NORMAL SOAP THOSE  AREAS AND YOUR NORMAL SHAMPOO.  WOMEN SHOULD AVOID SHAVING UNDER ARMS AND SHAVING LEGS 57 HOURS BEFORE USING CHLORHEXIDINE TO AVOID SKIN IRRITATION.  DO NOT USE IF ALLERGIC TO CHLORHEXIDINE.  PLEASE READ OVER ANY  FACT SHEETS THAT YOU WERE GIVEN: MRSA INFORMATION

## 2012-06-15 ENCOUNTER — Ambulatory Visit (HOSPITAL_COMMUNITY)
Admission: RE | Admit: 2012-06-15 | Discharge: 2012-06-15 | Disposition: A | Discharge status: Home / Self Care | Payer: Medicare Other | Source: Ambulatory Visit | Attending: Orthopedic Surgery | Admitting: Orthopedic Surgery

## 2012-06-15 ENCOUNTER — Encounter (HOSPITAL_COMMUNITY): Payer: Self-pay | Admitting: Anesthesiology

## 2012-06-15 ENCOUNTER — Ambulatory Visit (HOSPITAL_COMMUNITY): Payer: Medicare Other | Admitting: Anesthesiology

## 2012-06-15 ENCOUNTER — Encounter (HOSPITAL_COMMUNITY)
Admission: RE | Disposition: A | Discharge status: Home / Self Care | Payer: Self-pay | Source: Ambulatory Visit | Attending: Orthopedic Surgery

## 2012-06-15 ENCOUNTER — Encounter (HOSPITAL_COMMUNITY): Payer: Self-pay | Admitting: *Deleted

## 2012-06-15 DIAGNOSIS — Z01812 Encounter for preprocedural laboratory examination: Secondary | ICD-10-CM | POA: Insufficient documentation

## 2012-06-15 DIAGNOSIS — IMO0002 Reserved for concepts with insufficient information to code with codable children: Secondary | ICD-10-CM | POA: Insufficient documentation

## 2012-06-15 DIAGNOSIS — X58XXXA Exposure to other specified factors, initial encounter: Secondary | ICD-10-CM | POA: Insufficient documentation

## 2012-06-15 DIAGNOSIS — E785 Hyperlipidemia, unspecified: Secondary | ICD-10-CM | POA: Insufficient documentation

## 2012-06-15 DIAGNOSIS — Z9889 Other specified postprocedural states: Secondary | ICD-10-CM

## 2012-06-15 DIAGNOSIS — Z01810 Encounter for preprocedural cardiovascular examination: Secondary | ICD-10-CM | POA: Insufficient documentation

## 2012-06-15 DIAGNOSIS — M2240 Chondromalacia patellae, unspecified knee: Secondary | ICD-10-CM | POA: Insufficient documentation

## 2012-06-15 DIAGNOSIS — Z85828 Personal history of other malignant neoplasm of skin: Secondary | ICD-10-CM | POA: Insufficient documentation

## 2012-06-15 DIAGNOSIS — G4730 Sleep apnea, unspecified: Secondary | ICD-10-CM | POA: Insufficient documentation

## 2012-06-15 DIAGNOSIS — M1710 Unilateral primary osteoarthritis, unspecified knee: Secondary | ICD-10-CM | POA: Insufficient documentation

## 2012-06-15 SURGERY — ARTHROSCOPY, KNEE, WITH MEDIAL MENISCECTOMY
Anesthesia: General | Site: Knee | Laterality: Left | Wound class: Clean

## 2012-06-15 MED ORDER — BUPIVACAINE-EPINEPHRINE 0.5% -1:200000 IJ SOLN
INTRAMUSCULAR | Status: AC
  Filled 2012-06-15: qty 1

## 2012-06-15 MED ORDER — MORPHINE SULFATE 4 MG/ML IJ SOLN
INTRAMUSCULAR | Status: AC
  Filled 2012-06-15: qty 1

## 2012-06-15 MED ORDER — BUPIVACAINE-EPINEPHRINE 0.5% -1:200000 IJ SOLN
INTRAMUSCULAR | Status: DC | PRN
  Administered 2012-06-15: 20 mL

## 2012-06-15 MED ORDER — ONDANSETRON HCL 4 MG/2ML IJ SOLN
INTRAMUSCULAR | Status: DC | PRN
  Administered 2012-06-15: 4 mg via INTRAVENOUS

## 2012-06-15 MED ORDER — LACTATED RINGERS IR SOLN
Status: DC | PRN
  Administered 2012-06-15: 3000 mL

## 2012-06-15 MED ORDER — MORPHINE SULFATE 4 MG/ML IJ SOLN
INTRAMUSCULAR | Status: DC | PRN
  Administered 2012-06-15: 2 mg

## 2012-06-15 MED ORDER — FENTANYL CITRATE 0.05 MG/ML IJ SOLN
INTRAMUSCULAR | Status: DC | PRN
  Administered 2012-06-15 (×3): 50 ug via INTRAVENOUS
  Administered 2012-06-15: 25 ug via INTRAVENOUS

## 2012-06-15 MED ORDER — LACTATED RINGERS IV SOLN: INTRAVENOUS | Status: DC

## 2012-06-15 MED ORDER — LIDOCAINE HCL (CARDIAC) 20 MG/ML IV SOLN
INTRAVENOUS | Status: DC | PRN
  Administered 2012-06-15: 80 mg via INTRAVENOUS

## 2012-06-15 MED ORDER — EPINEPHRINE HCL 1 MG/ML IJ SOLN
INTRAMUSCULAR | Status: AC
  Filled 2012-06-15: qty 2

## 2012-06-15 MED ORDER — FENTANYL CITRATE 0.05 MG/ML IJ SOLN
25.0000 ug | INTRAMUSCULAR | Status: DC | PRN
  Administered 2012-06-15: 50 ug via INTRAVENOUS
  Administered 2012-06-15 (×2): 25 ug via INTRAVENOUS

## 2012-06-15 MED ORDER — POVIDONE-IODINE 7.5 % EX SOLN: Freq: Once | CUTANEOUS | Status: DC

## 2012-06-15 MED ORDER — FENTANYL CITRATE 0.05 MG/ML IJ SOLN
INTRAMUSCULAR | Status: AC
  Filled 2012-06-15: qty 2

## 2012-06-15 MED ORDER — HYDROCODONE-ACETAMINOPHEN 5-325 MG PO TABS
1.0000 | ORAL_TABLET | Freq: Four times a day (QID) | ORAL | Status: DC | PRN
  Administered 2012-06-15: 1 via ORAL
  Filled 2012-06-15: qty 1

## 2012-06-15 MED ORDER — PROPOFOL 10 MG/ML IV EMUL
INTRAVENOUS | Status: DC | PRN
  Administered 2012-06-15: 200 mg via INTRAVENOUS

## 2012-06-15 MED ORDER — EPINEPHRINE HCL 1 MG/ML IJ SOLN
INTRAMUSCULAR | Status: DC | PRN
  Administered 2012-06-15: 1 mg

## 2012-06-15 MED ORDER — HYDROCODONE-ACETAMINOPHEN 5-325 MG PO TABS: 1.0000 | ORAL_TABLET | Freq: Four times a day (QID) | ORAL | Status: AC | PRN

## 2012-06-15 MED ORDER — ACETAMINOPHEN 10 MG/ML IV SOLN
INTRAVENOUS | Status: DC | PRN
  Administered 2012-06-15: 1000 mg via INTRAVENOUS

## 2012-06-15 MED ORDER — ACETAMINOPHEN 10 MG/ML IV SOLN
INTRAVENOUS | Status: AC
  Filled 2012-06-15: qty 100

## 2012-06-15 MED ORDER — LACTATED RINGERS IV SOLN
INTRAVENOUS | Status: DC
  Administered 2012-06-15: 1000 mL via INTRAVENOUS

## 2012-06-15 SURGICAL SUPPLY — 28 items
BANDAGE ELASTIC 6 VELCRO ST LF (GAUZE/BANDAGES/DRESSINGS) ×2 IMPLANT
BANDAGE GAUZE ELAST BULKY 4 IN (GAUZE/BANDAGES/DRESSINGS) ×2 IMPLANT
BLADE 4.2CUDA (BLADE) IMPLANT
BLADE CUDA SHAVER 3.5 (BLADE) ×2 IMPLANT
CLOTH BEACON ORANGE TIMEOUT ST (SAFETY) ×2 IMPLANT
CUFF TOURN SGL QUICK 34 (TOURNIQUET CUFF) ×2
CUFF TRNQT CYL 34X4X40X1 (TOURNIQUET CUFF) ×1 IMPLANT
DRSG ADAPTIC 3X8 NADH LF (GAUZE/BANDAGES/DRESSINGS) ×2 IMPLANT
DRSG EMULSION OIL 3X3 NADH (GAUZE/BANDAGES/DRESSINGS) ×2 IMPLANT
DRSG PAD ABDOMINAL 8X10 ST (GAUZE/BANDAGES/DRESSINGS) ×2 IMPLANT
DURAPREP 26ML APPLICATOR (WOUND CARE) ×4 IMPLANT
ELECT REM PT RETURN 9FT ADLT (ELECTROSURGICAL)
ELECTRODE REM PT RTRN 9FT ADLT (ELECTROSURGICAL) IMPLANT
GLOVE BIO SURGEON STRL SZ7.5 (GLOVE) ×2 IMPLANT
GLOVE BIO SURGEON STRL SZ8 (GLOVE) ×4 IMPLANT
GLOVE ECLIPSE 8.0 STRL XLNG CF (GLOVE) ×6 IMPLANT
GLOVE INDICATOR 8.0 STRL GRN (GLOVE) ×4 IMPLANT
MANIFOLD NEPTUNE II (INSTRUMENTS) ×6 IMPLANT
PACK ARTHROSCOPY WL (CUSTOM PROCEDURE TRAY) ×2 IMPLANT
PAD CAST 4YDX4 CTTN HI CHSV (CAST SUPPLIES) IMPLANT
PADDING CAST COTTON 4X4 STRL (CAST SUPPLIES)
POSITIONER SURGICAL ARM (MISCELLANEOUS) IMPLANT
SET ARTHROSCOPY TUBING (MISCELLANEOUS) ×2
SET ARTHROSCOPY TUBING LN (MISCELLANEOUS) ×1 IMPLANT
SPONGE GAUZE 4X4 12PLY (GAUZE/BANDAGES/DRESSINGS) ×2 IMPLANT
SUT ETHILON 4 0 PS 2 18 (SUTURE) ×2 IMPLANT
WAND 90 DEG TURBOVAC W/CORD (SURGICAL WAND) ×2 IMPLANT
WRAP KNEE MAXI GEL POST OP (GAUZE/BANDAGES/DRESSINGS) ×2 IMPLANT

## 2012-06-15 NOTE — Anesthesia Postprocedure Evaluation (Signed)
  Anesthesia Post-op Note  [name]  Procedure(s) Performed: Procedure(s) (LRB): KNEE ARTHROSCOPY WITH MEDIAL MENISECTOMY (Left)  Patient Location: PACU  Anesthesia Type: General  Level of Consciousness: awake and alert   Airway and Oxygen Therapy: Patient Spontanous Breathing  Post-op Pain: mild  Post-op Assessment: Post-op Vital signs reviewed, Patient's Cardiovascular Status Stable, Respiratory Function Stable, Patent Airway and No signs of Nausea or vomiting  Post-op Vital Signs: stable  Complications: No apparent anesthesia complications

## 2012-06-15 NOTE — Anesthesia Preprocedure Evaluation (Addendum)
Anesthesia Evaluation  Patient identified by MRN, date of birth, ID band Patient awake    Reviewed: Allergy & Precautions, H&P , NPO status , Patient's Chart, lab work & pertinent test results  Airway Mallampati: III TM Distance: >3 FB Neck ROM: full    Dental No notable dental hx. (+) Teeth Intact and Dental Advisory Given   Pulmonary neg pulmonary ROS, sleep apnea and Continuous Positive Airway Pressure Ventilation ,  breath sounds clear to auscultation  Pulmonary exam normal       Cardiovascular Exercise Tolerance: Good negative cardio ROS  Rhythm:regular Rate:Normal  Heart palpitations.  Not a big problem.   Neuro/Psych negative neurological ROS  negative psych ROS   GI/Hepatic negative GI ROS, Neg liver ROS,   Endo/Other  negative endocrine ROS  Renal/GU negative Renal ROS  negative genitourinary   Musculoskeletal negative musculoskeletal ROS (+)   Abdominal   Peds negative pediatric ROS (+)  Hematology negative hematology ROS (+)   Anesthesia Other Findings   Reproductive/Obstetrics negative OB ROS                          Anesthesia Physical Anesthesia Plan  ASA: III  Anesthesia Plan: General   Post-op Pain Management:    Induction: Intravenous  Airway Management Planned: LMA  Additional Equipment:   Intra-op Plan:   Post-operative Plan:   Informed Consent: I have reviewed the patients History and Physical, chart, labs and discussed the procedure including the risks, benefits and alternatives for the proposed anesthesia with the patient or authorized representative who has indicated his/her understanding and acceptance.   Dental Advisory Given  Plan Discussed with: CRNA and Surgeon  Anesthesia Plan Comments:         Anesthesia Quick Evaluation

## 2012-06-15 NOTE — Transfer of Care (Signed)
Immediate Anesthesia Transfer of Care Note  [name]  Procedure(s) Performed: Procedure(s) (LRB): KNEE ARTHROSCOPY WITH MEDIAL MENISECTOMY (Left)  Patient Location: PACU  Anesthesia Type: General  Level of Consciousness: awake, alert , oriented and patient cooperative  Airway & Oxygen Therapy: Patient Spontanous Breathing and Patient connected to face mask oxygen  Post-op Assessment: Report given to PACU RN, Post -op Vital signs reviewed and stable and Patient moving all extremities  Post vital signs: Reviewed and stable  Complications: No apparent anesthesia complications

## 2012-06-15 NOTE — H&P (Signed)
[name] is an 78 y.o. male.   Chief Complaint: painful lt knee HPI: MRI demonstrates a torn medial meniscus  Past Medical History  Diagnosis Date  . Heart palpitations     PT STATES NOT REALLY A BIG PROBLEM--DENIES CHEST PAINS  . Insomnia   . Sleep apnea     USES CPAP EVERY NIGHT  . BPH (benign prostatic hypertrophy)   . Constipation   . Cancer     SKIN CANCERS  . History of torn meniscus of left knee     PAINFUL LEFT KNEE  . Hearing loss     BILATERAL - WEARS HEARING AIDS  . Arthritis     HANDS  . Hyperlipidemia     Past Surgical History  Procedure Date  . Skin cancers removed from left forehead and left nose   . Skin cancers removed from both legs   . Surgery left neck for removal of metastatic lymph node ( from cancer left forehead)  and left neck dissection-rest of lymph nodes negative for any cancer   . Carpal tunnel release     BILATERAL  . Right rotator cuff repair     History reviewed. No pertinent family history. Social History:  reports that he has quit smoking. He does not have any smokeless tobacco history on file. He reports that he drinks alcohol. He reports that he does not use illicit drugs.  Allergies:  Allergies  Allergen Reactions  . Statins     Muscle pains  . Latex Rash    bandaids--no other latex problems    Medications Prior to Admission  Medication Sig Dispense Refill  . colesevelam (WELCHOL) 625 MG tablet Take 1,875 mg by mouth 2 (two) times daily with a meal.      . Glucosamine-Chondroit-Vit C-Mn (GLUCOSAMINE CHONDR 500 COMPLEX) CAPS Take 2 capsules by mouth daily.       Marland Kitchen ibuprofen (ADVIL,MOTRIN) 200 MG tablet Take 800 mg by mouth 3 (three) times daily. For knee pain      . cholecalciferol (VITAMIN D) 1000 UNITS tablet Take 1,000 Units by mouth every morning.        No results found for this or any previous visit (from the past 48 hour(s)). No results found.  ROS  Blood pressure 152/74, pulse 57, temperature 97 F (36.1  C), temperature source Oral, resp. rate 18, SpO2 99.00%. Physical Exam  Constitutional: He is oriented to person, place, and time. He appears well-developed and well-nourished.  HENT:  Head: Normocephalic and atraumatic.  Right Ear: External ear normal.  Left Ear: External ear normal.  Nose: Nose normal.  Mouth/Throat: Oropharynx is clear and moist.  Eyes: Conjunctivae and EOM are normal. Pupils are equal, round, and reactive to light.  Neck: Normal range of motion. Neck supple.  Cardiovascular: Normal rate, regular rhythm, normal heart sounds and intact distal pulses.   Respiratory: Effort normal and breath sounds normal.  GI: Soft. Bowel sounds are normal.  Musculoskeletal: Normal range of motion. He exhibits tenderness.       Tender medial joint line lt knee  Lymphadenopathy:    Cervical adenopathy: painful lt kneep.  Neurological: He is alert and oriented to person, place, and time. He has normal reflexes.  Skin: Skin is warm and dry.  Psychiatric: He has a normal mood and affect. His behavior is normal. Judgment and thought content normal.     Assessment/Plan Torn medial meniscus left knee Left knee arthroscopy with partial medial meniscectomy  [name] P 06/15/2012,  9:45 AM

## 2012-06-15 NOTE — Brief Op Note (Signed)
06/15/2012  12:00 PM  PATIENT:  [name]  78 y.o. male  PRE-OPERATIVE DIAGNOSIS:  Left Knee Torn Medial meniscus and medial compartment oteoarthritis  POST-OPERATIVE DIAGNOSIS:  left knee torn medial meniscus and medial compartment arthritis  PROCEDURE:  Procedure(s) (LRB): KNEE ARTHROSCOPY WITH MEDIAL MENISECTOMY (Left) and microfracture medial femoral condyle  SURGEON:  Surgeon(s) and Role:    * Magnus Sinning, MD - Primary  PHYSICIAN ASSISTANT:   ASSISTANTS: nurse  ANESTHESIA:   local and general  EBL:  Total I/O In: 1100 [I.V.:1100] Out: 5 [Blood:5]  BLOOD ADMINISTERED:none  DRAINS: none   LOCAL MEDICATIONS USED:  MARCAINE     SPECIMEN:  No Specimen  DISPOSITION OF SPECIMEN:  N/A  COUNTS:  YES  TOURNIQUET:   Total Tourniquet Time Documented: Thigh (Left) - 55 minutes  DICTATION: .Other Dictation: Dictation Number [id number]  PLAN OF CARE: Discharge to home after PACU  PATIENT DISPOSITION:  PACU - hemodynamically stable.   Delay start of Pharmacological VTE agent (>24hrs) due to surgical blood loss or risk of bleeding: yes

## 2012-06-15 NOTE — Preoperative (Signed)
Beta Blockers   Reason not to administer Beta Blockers:Not Applicable 

## 2012-06-16 NOTE — Op Note (Signed)
NAMETAVARI, COVALT NO.:  [id number]  MEDICAL RECORD NO.:  [id number]  LOCATION:  [facility]                         FACILITY:  [facility]  PHYSICIAN:  Tarri Glenn, M.D.  DATE OF BIRTH:  1929/01/06  DATE OF PROCEDURE:  06/15/2012 DATE OF DISCHARGE:  06/15/2012                              OPERATIVE REPORT   PREOPERATIVE DIAGNOSIS: 1. Torn medial meniscus. 2. Medial compartment arthritis, left knee.  POSTOPERATIVE DIAGNOSIS: 1. Torn medial meniscus. 2. Medial compartment arthritis, left knee.  OPERATION: 1. Left knee arthroscopy with partial medial meniscectomy. 2. Microfracture medial femoral condyle.  SURGEON:  Tarri Glenn, M.D.  ASSISTANT:  Nurse.  ANESTHESIA:  General.  PATHOLOGY AND JUSTIFICATION FOR PROCEDURE:  Painful left knee with MRI demonstrating these 2 entities.  PROCEDURE IN DETAIL:  Satisfactory general anesthesia, Ace wrap, and knee support to right lower extremity.  Pneumatic tourniquet was applied to left lower extremity with the leg Esmarch out non-sterilely and tourniquet inflated to 300 mmHg.  Thigh stabilizer was applied.  Left leg was prepped with DuraPrep from stabilizer to ankle and draped in sterile field.  Time-out was performed.  Latex precautions, superior and medial saline inflow.  First an anterolateral portal medial compartment knee joint was evaluated with the findings of a complex tear of the entire posterior half of the medial meniscus and grade 3 and in many areas grade 4 chondromalacia of the medial femoral condyle.  After performing a slight synovectomy with the 3.5 shaver I resected the medial meniscus back to stable rim with a combination of the shaver and baskets.  Final pictures were taken, I then used the microfracture set to create a microfracture of all the exposed of bone in the medial femoral condyle with a curved awl.  After smoothing this down with 3.5 shaver final pictures were taken.  I then reversed  portals.  He had some minimal fraying in the midportion of lateral meniscus, but nothing that required shaving and the joint surfaces looked good.  Looking at the lateral gutter and suprapatellar area, the patella surface had minimal wear.  The knee joint was irrigated to clear and all fluid possible removed.  I injected 2 mL of 2 mg of morphine and 20 mL of Marcaine with adrenaline into the joint through the inflow apparatus.  I then closed this portal with 4-0 nylon as well.  Betadine, Adaptic, dry sterile dressing were applied.  Tourniquet was released.  He tolerated the procedure well and was taken to the recovery room in a satisfied condition with no known complications.          ______________________________ Tarri Glenn, M.D.     JA/MEDQ  D:  06/15/2012  T:  06/16/2012  Job:  [id number]

## 2012-06-24 ENCOUNTER — Ambulatory Visit (HOSPITAL_COMMUNITY)
Admission: RE | Admit: 2012-06-24 | Discharge: 2012-06-24 | Disposition: A | Discharge status: Home / Self Care | Payer: Medicare Other | Source: Ambulatory Visit | Attending: Orthopedic Surgery | Admitting: Orthopedic Surgery

## 2012-06-24 DIAGNOSIS — M7989 Other specified soft tissue disorders: Secondary | ICD-10-CM

## 2012-06-24 DIAGNOSIS — R609 Edema, unspecified: Secondary | ICD-10-CM

## 2012-06-24 DIAGNOSIS — Z9889 Other specified postprocedural states: Secondary | ICD-10-CM | POA: Insufficient documentation

## 2012-06-24 DIAGNOSIS — R52 Pain, unspecified: Secondary | ICD-10-CM

## 2012-06-24 DIAGNOSIS — M79609 Pain in unspecified limb: Secondary | ICD-10-CM | POA: Insufficient documentation

## 2012-06-24 NOTE — Progress Notes (Signed)
VASCULAR LAB PRELIMINARY  PRELIMINARY  PRELIMINARY  PRELIMINARY  Left lower extremity venous duplex completed.    Preliminary report:  Negative for left lower extremity DVT or superficial thrombus. Positive for an area of mixed echoes coursing 4.19 cm from the medial popliteal fossa into the proximal calf consistent with a ruptured Baker's cyst.  Jerriann Schrom, RVS 06/24/2012, 11:47 AM

## 2013-01-18 ENCOUNTER — Other Ambulatory Visit: Payer: Self-pay | Admitting: Cardiology

## 2013-01-19 ENCOUNTER — Other Ambulatory Visit: Payer: Self-pay | Admitting: Cardiology

## 2013-01-19 ENCOUNTER — Encounter: Payer: Self-pay | Admitting: Cardiology

## 2013-01-19 NOTE — H&P (Signed)
Office Visit     Patient: [place], [place] Provider: Fransico Him, MD  DOB: December 03, 1928 Age: 79 Y Sex: Male Date: 01/17/2013  Phone: [phone]   Address: [place], [place], [id number]  Pcp: Lavone Orn       Subjective:     CC:    1. TT/ECHO F/u.        HPI:  General:  The patient presents today for evaluation of atrial fibrillation and CHF. He recently saw his primary MD for increased fatigue, DOE and LE edema. He was found to be in atrial fibrillation at that time. He denies any chest pain but possibly some mild chest pressure but he is vague in his description. He has had episodes of palpitations but denies any dizziness or syncope..        ROS:  See HPI, A twelve system review was perfomed at today's visit. For pertinent positives and negatives see HPI.       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        Family History:        Social History:  General:  History of smoking cigarettes: Former smoker, Quit in year 1962, Pack-year Hx: 20.  no Smoking.  Alcohol: yes, 2 drink a day.  no Recreational drug use.  Exercise: PT for knee.  Occupation: Retired Chief Financial Officer, [employer].  Marital Status: Married, wife lung cancer.        Medications: Glucosamine HCl 1500 MG Tablet 2 tablets once a day, Vitamin D 2000 UNIT Tablet 1 tablet with a meal Once a day, Ibuprofen 800 MG Tablet 1 tablet as needed once a day, Welchol 3.75 GM Packet 1 packet mixed with water with a meal Once  a day, Metoprolol Tartrate 25 MG Tablet 1/2 tablet Twice a day, Aspirin 325 MG Tablet 1 tablet Once a day, Furosemide 40 MG Tablet 1 tablet 2 tablets for three days then back to 1 tablet, Medication List reviewed and reconciled with the patient       Allergies: Statins: Myalgias.       Objective:     Vitals: Wt 167.6, Wt change -3.8 lb, Ht 68.5, BMI 25.11, Pulse sitting 74, BP sitting 132/78.       Examination:        Assessment:     Assessment:  1. Mitral regurgitation - 424.0 (Primary)  2. Atrial fibrillation - 427.31 (Primary)  3. Acute diastolic heart failure - A999333  4. Mitral valve prolapse - 424.0    Plan:     1. Mitral regurgitation  Diagnostic Imaging:Cardiac Cath (Ordered for 01/24/2013)  Risks and benefits of cardiac catheterization have been reviewed including risk of stroke, heart attack, death, bleeding, renal impariment and arterial damage. There was ample oppurtuny to answer questions. Alternatives were discussed. Patient understands and wishes to proceed.       2. Atrial fibrillation  LAB: PT (Prothrombin Time) [id number]) okay for cath    Prothrombin Time 10.9 9.1-12.0 - SEC    INR 1.0 0.8-1.2 -     Smart,Jeremy 01/18/2013 08:27:53 AM > okay for cath Suzane Vanderweide M 01/19/2013 10:29:50 AM >  LAB: Basic Metabolic    GLUCOSE 87 123456 - mg/dL    BUN 22 6-26 - mg/dL    CREATININE 1.51 0.60-1.30 - mg/dl H   eGFR (NON-AFRICAN AMERICAN) 44 >60 - calc L   eGFR (AFRICAN AMERICAN) 54 >60 - calc L   SODIUM 140 136-145 - mmol/L    POTASSIUM 4.3 3.5-5.5 - mmol/L    CHLORIDE 102 98-107 - mmol/L    C02 32 22-32 - mmol/L    ANION GAP 11.1 6.0-20.0 - mmol/L    CALCIUM 10.0 8.6-10.3 - mg/dL     Maday Guarino M 01/19/2013 03:33:28 PM >decreased renal function - please have patient stop his Ibuprofen and cut lasix back to 20mg  daily and hold the lasix the day before and the day of cath and recheck BMET the day before cath    LAB: CBC with Diff Normal    WBC 6.2  4.0-11.0 - K/ul    RBC 4.59 4.20-5.80 - M/uL    HGB 13.1 13.0-17.0 - g/dL    HCT 42.3 39.0-52.0 - %    MCH 28.6 27.0-33.0 - pg    MPV 9.9 7.5-10.7 - fL    MCV 92.1 80.0-94.0 - fL    MCHC 31.1 32.0-36.0 - g/dL L   RDW 13.4 11.5-15.5 - %    NRBC# 0.00 -    PLT 181 150-400 - K/uL    NEUT % 68.3 43.3-71.9 - %    NRBC% 0.00 - %    LYMPH% 16.2 16.8-43.5 - % L   MONO % 10.9 4.6-12.4 - %    EOS % 3.8 0.0-7.8 - %    BASO % 0.8 0.0-1.0 - %    NEUT # 4.2 1.9-7.2 - K/uL    LYMPH# 1.00 1.10-2.70 - K/uL L   MONO # 0.7 0.3-0.8 - K/uL    EOS # 0.2 0.0-0.6 - K/uL    BASO # 0.1 0.0-0.1 - K/uL     Millette Halberstam M 01/19/2013 11:54:09 AM >       3. Acute diastolic heart failure Continue Metoprolol Tartrate Tablet, 25 MG, 1/2 tablet, Orally, Twice a day ; Continue Furosemide Tablet, 40 MG, 1 tablet, Orally, 2 tablets for three days then back to 1 tablet ; Continue Aspirin Tablet, 325 MG, 1 tablet, Orally, Once a day .        Procedures:  Venipuncture:  Venipuncture: Smith,Michele 01/17/2013 03:06:40 PM > , performed in right arm.        Immunizations:        Labs:        Procedure Codes: [id number] ECL BMP, [id number] ECL CBC PLATELET DIFF, [id number] BLOOD COLLECTION ROUTINE VENIPUNCTURE       Preventive:         Follow Up: cath      Provider: Fransico Him, MD  Patient: Tyheem, Freid DOB: Jan 13, 1929 Date: 01/17/2013

## 2013-01-24 ENCOUNTER — Encounter (HOSPITAL_BASED_OUTPATIENT_CLINIC_OR_DEPARTMENT_OTHER)
Admission: RE | Disposition: A | Discharge status: Home / Self Care | Payer: Self-pay | Source: Ambulatory Visit | Attending: Cardiology

## 2013-01-24 ENCOUNTER — Inpatient Hospital Stay (HOSPITAL_BASED_OUTPATIENT_CLINIC_OR_DEPARTMENT_OTHER)
Admission: RE | Admit: 2013-01-24 | Discharge: 2013-01-24 | Disposition: A | Discharge status: Home / Self Care | Payer: Medicare Other | Source: Ambulatory Visit | Attending: Cardiology | Admitting: Cardiology

## 2013-01-24 DIAGNOSIS — I4891 Unspecified atrial fibrillation: Secondary | ICD-10-CM | POA: Insufficient documentation

## 2013-01-24 DIAGNOSIS — I059 Rheumatic mitral valve disease, unspecified: Secondary | ICD-10-CM | POA: Insufficient documentation

## 2013-01-24 DIAGNOSIS — I2510 Atherosclerotic heart disease of native coronary artery without angina pectoris: Secondary | ICD-10-CM

## 2013-01-24 DIAGNOSIS — I341 Nonrheumatic mitral (valve) prolapse: Secondary | ICD-10-CM | POA: Diagnosis present

## 2013-01-24 DIAGNOSIS — I340 Nonrheumatic mitral (valve) insufficiency: Secondary | ICD-10-CM

## 2013-01-24 DIAGNOSIS — I5031 Acute diastolic (congestive) heart failure: Secondary | ICD-10-CM | POA: Diagnosis present

## 2013-01-24 DIAGNOSIS — I2789 Other specified pulmonary heart diseases: Secondary | ICD-10-CM | POA: Insufficient documentation

## 2013-01-24 LAB — POCT I-STAT, CHEM 8
BUN: 29 mg/dL — ABNORMAL HIGH (ref 6–23)
Calcium, Ion: 1.26 mmol/L (ref 1.13–1.30)
Hemoglobin: 15.3 g/dL (ref 13.0–17.0)
TCO2: 30 mmol/L (ref 0–100)

## 2013-01-24 LAB — POCT I-STAT 3, VENOUS BLOOD GAS (G3P V)
TCO2: 27 mmol/L (ref 0–100)
pH, Ven: 7.312 — ABNORMAL HIGH (ref 7.250–7.300)
pO2, Ven: 32 mmHg (ref 30.0–45.0)

## 2013-01-24 LAB — POCT I-STAT 3, ART BLOOD GAS (G3+)
pCO2 arterial: 42.1 mmHg (ref 35.0–45.0)
pO2, Arterial: 78 mmHg — ABNORMAL LOW (ref 80.0–100.0)

## 2013-01-24 SURGERY — JV LEFT HEART CATHETERIZATION WITH CORONARY ANGIOGRAM
Anesthesia: Moderate Sedation

## 2013-01-24 MED ORDER — SODIUM CHLORIDE 0.9 % IJ SOLN: 3.0000 mL | INTRAMUSCULAR | Status: DC | PRN

## 2013-01-24 MED ORDER — ACETAMINOPHEN 325 MG PO TABS: 650.0000 mg | ORAL_TABLET | ORAL | Status: DC | PRN

## 2013-01-24 MED ORDER — SODIUM CHLORIDE 0.9 % IJ SOLN: 3.0000 mL | Freq: Two times a day (BID) | INTRAMUSCULAR | Status: DC

## 2013-01-24 MED ORDER — ASPIRIN 81 MG PO CHEW
324.0000 mg | CHEWABLE_TABLET | ORAL | Status: AC
  Administered 2013-01-24: 324 mg via ORAL

## 2013-01-24 MED ORDER — WARFARIN SODIUM 5 MG PO TABS: 5.0000 mg | ORAL_TABLET | Freq: Every day | ORAL | Status: DC

## 2013-01-24 MED ORDER — SODIUM CHLORIDE 0.9 % IV SOLN: 250.0000 mL | INTRAVENOUS | Status: DC | PRN

## 2013-01-24 MED ORDER — DIAZEPAM 5 MG PO TABS
5.0000 mg | ORAL_TABLET | ORAL | Status: AC
  Administered 2013-01-24: 5 mg via ORAL

## 2013-01-24 MED ORDER — FUROSEMIDE 20 MG PO TABS: 40.0000 mg | ORAL_TABLET | Freq: Every day | ORAL | Status: DC

## 2013-01-24 MED ORDER — ONDANSETRON HCL 4 MG/2ML IJ SOLN: 4.0000 mg | Freq: Four times a day (QID) | INTRAMUSCULAR | Status: DC | PRN

## 2013-01-24 MED ORDER — SODIUM CHLORIDE 0.9 % IV SOLN
INTRAVENOUS | Status: DC
  Administered 2013-01-24: 10:00:00 via INTRAVENOUS

## 2013-01-24 NOTE — Interval H&P Note (Signed)
History and Physical Interval Note:  01/24/2013 11:57 AM  [name]  has presented today for surgery, with the diagnosis of chest apin  The various methods of treatment have been discussed with the patient and family. After consideration of risks, benefits and other options for treatment, the patient has consented to  Procedure(s): JV LEFT HEART CATHETERIZATION WITH CORONARY ANGIOGRAM (N/A) as a surgical intervention .  The patient's history has been reviewed, patient examined, no change in status, stable for surgery.  I have reviewed the patient's chart and labs.  Questions were answered to the patient's satisfaction.     TURNER,TRACI R

## 2013-01-24 NOTE — H&P (View-Only) (Signed)
Office Visit     Patient: [name], [name] Provider: Fransico Him, MD  DOB: [birth year] Age: 79 Y Sex: Male Date: 01/17/2013  Phone: [phone]   Address: [place] [place], [place], [place]  Pcp: Lavone Orn       Subjective:     CC:    1. TT/ECHO F/u.        HPI:  General:  The patient presents today for evaluation of atrial fibrillation and CHF. He recently saw his primary MD for increased fatigue, DOE and LE edema. He was found to be in atrial fibrillation at that time. He denies any chest pain but possibly some mild chest pressure but he is vague in his description. He has had episodes of palpitations but denies any dizziness or syncope..        ROS:  See HPI, A twelve system review was perfomed at today's visit. For pertinent positives and negatives see HPI.       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        Family History:        Social History:  General:  History of smoking cigarettes: Former smoker, Quit in year 1962, Pack-year Hx: 20.  no Smoking.  Alcohol: yes, 2 drink a day.  no Recreational drug use.  Exercise: PT for knee.  Occupation: Retired Chief Financial Officer, [employer].  Marital Status: Married, wife lung cancer.        Medications: Glucosamine HCl 1500 MG Tablet 2 tablets once a day, Vitamin D 2000 UNIT Tablet 1 tablet with a meal Once a day, Ibuprofen 800 MG Tablet 1 tablet as needed once a day, Welchol 3.75 GM Packet 1 packet mixed with water with a meal Once  a day, Metoprolol Tartrate 25 MG Tablet 1/2 tablet Twice a day, Aspirin 325 MG Tablet 1 tablet Once a day, Furosemide 40 MG Tablet 1 tablet 2 tablets for three days then back to 1 tablet, Medication List reviewed and reconciled with the patient       Allergies: Statins: Myalgias.       Objective:     Vitals: Wt 167.6, Wt change -3.8 lb, Ht 68.5, BMI 25.11, Pulse sitting 74, BP sitting 132/78.       Examination:        Assessment:     Assessment:  1. Mitral regurgitation - 424.0 (Primary)  2. Atrial fibrillation - 427.31 (Primary)  3. Acute diastolic heart failure - [id number]  4. Mitral valve prolapse - 424.0    Plan:     1. Mitral regurgitation  Diagnostic Imaging:Cardiac Cath (Ordered for 01/24/2013)  Risks and benefits of cardiac catheterization have been reviewed including risk of stroke, heart attack, death, bleeding, renal impariment and arterial damage. There was ample oppurtuny to answer questions. Alternatives were discussed. Patient understands and wishes to proceed.       2. Atrial fibrillation  LAB: PT (Prothrombin Time) [id number]) okay for cath    Prothrombin Time 10.9 9.1-12.0 - SEC    INR 1.0 0.8-1.2 -     Smart,Jeremy 01/18/2013 08:27:53 AM > okay for cath TURNER,TRACI M 01/19/2013 10:29:50 AM >  LAB: Basic Metabolic    GLUCOSE 87 123456 - mg/dL    BUN 22 6-26 - mg/dL    CREATININE 1.51 0.60-1.30 - mg/dl H   eGFR (NON-AFRICAN AMERICAN) 44 >60 - calc L   eGFR (AFRICAN AMERICAN) 54 >60 - calc L   SODIUM 140 136-145 - mmol/L    POTASSIUM 4.3 3.5-5.5 - mmol/L    CHLORIDE 102 98-107 - mmol/L    C02 32 22-32 - mmol/L    ANION GAP 11.1 6.0-20.0 - mmol/L    CALCIUM 10.0 8.6-10.3 - mg/dL     TURNER,TRACI M 01/19/2013 03:33:28 PM >decreased renal function - please have patient stop his Ibuprofen and cut lasix back to 20mg  daily and hold the lasix the day before and the day of cath and recheck BMET the day before cath    LAB: CBC with Diff Normal    WBC 6.2  4.0-11.0 - K/ul    RBC 4.59 4.20-5.80 - M/uL    HGB 13.1 13.0-17.0 - g/dL    HCT 42.3 39.0-52.0 - %    MCH 28.6 27.0-33.0 - pg    MPV 9.9 7.5-10.7 - fL    MCV 92.1 80.0-94.0 - fL    MCHC 31.1 32.0-36.0 - g/dL L   RDW 13.4 11.5-15.5 - %    NRBC# 0.00 -    PLT 181 150-400 - K/uL    NEUT % 68.3 43.3-71.9 - %    NRBC% 0.00 - %    LYMPH% 16.2 16.8-43.5 - % L   MONO % 10.9 4.6-12.4 - %    EOS % 3.8 0.0-7.8 - %    BASO % 0.8 0.0-1.0 - %    NEUT # 4.2 1.9-7.2 - K/uL    LYMPH# 1.00 1.10-2.70 - K/uL L   MONO # 0.7 0.3-0.8 - K/uL    EOS # 0.2 0.0-0.6 - K/uL    BASO # 0.1 0.0-0.1 - K/uL     TURNER,TRACI M 01/19/2013 11:54:09 AM >       3. Acute diastolic heart failure Continue Metoprolol Tartrate Tablet, 25 MG, 1/2 tablet, Orally, Twice a day ; Continue Furosemide Tablet, 40 MG, 1 tablet, Orally, 2 tablets for three days then back to 1 tablet ; Continue Aspirin Tablet, 325 MG, 1 tablet, Orally, Once a day .        Procedures:  Venipuncture:  Venipuncture: Smith,Michele 01/17/2013 03:06:40 PM > , performed in right arm.        Immunizations:        Labs:        Procedure Codes: [id number] ECL BMP, [id number] ECL CBC PLATELET DIFF, [id number] BLOOD COLLECTION ROUTINE VENIPUNCTURE       Preventive:         Follow Up: cath      Provider: Fransico Him, MD  [name], [name] DOB: [birth year] Date: 01/17/2013

## 2013-01-24 NOTE — Progress Notes (Signed)
I-stat chem 8 drawn, creatnine 1.4 called to Dr. Radford Pax.

## 2013-01-24 NOTE — OR Nursing (Signed)
Dr Radford Pax at bedside to discuss results and treatment plan with pt and family

## 2013-01-24 NOTE — CV Procedure (Signed)
Cardiac Cath Procedure Note  Indication:   Procedures performed:  1) Right heart cathererization 2) Selective coronary angiography 3) Left heart catheterization 4) Left ventriculogram  Description of procedure:     The risks and indication of the procedure were explained. Consent was signed and placed on the chart. An appropriate timeout was taken prior to the procedure. The right groin was prepped and draped in the routine sterile fashion and anesthetized with 1% local lidocaine.   A 5 FR arterial sheath was placed in the right femoral artery using a modified Seldinger technique. Standard catheters including a JL4, JR4 and angled pigtail were used. All catheter exchanges were made over a wire. A 7 FR venous sheath was placed in the right femoral vein using a modified Seldinger technique. A standard Swan-Ganz catheter was used for the procedure.   Complications:  None apparent  Findings:  RA = 12/31mmHg with mean 55mmHg RV = 42/1mmHg with mean 49mmHg PA = 42/15mmHg with a mean of 54mmHg PCW =42/47mmHg with a mean of 11mmHg and large V waves Fick cardiac output/index =3.5/1.8  FA sat =95% PA sat =57% RA sat- 54% Ao Pressure:99/15mmHg LV Pressure:90/80mmHg  There was no signficant gradient across the aortic valve on pullback.  Left main: widely patent and bifurcates into an LAD and left circumflex arteries.    LAD: Widely patent throughout its course and gives rise to a moderate first diagonal which is patent and then a small second diagonal which is patent.  [id number] patent throughout its course.  It gives rise to a large OM 1 which has a 30-40% stenosis in the ostium.  It bifurcates into 2 daughter branches which are widely patent.  This is a left dominant system.  [id number] patent, small and nondomiant  LV-gram done in the RAO projection: not done due to elevated PCW pressure  Assessment: 1. Nonobstructive ASCAD with a30-40% stenosis in the ostium of the OM1 otherwise  normal coronary arteries. 2.  Normal LVF by echo 3.  Severe posterior MVP with severe eccentric MR by echo 4.  Mild pulmonary HTN by right heart cath. 5.  Atrial fibrilation - patient now back in afib   Plan/Discussion: 1.  D/C home once IVF and bedrest complete 2.  Outpatient TEE will be set up 3.  Start coumadin due to recurrent afib 4.  CVTS consult with Dr. Otilio Jefferson, MD 11:41 AM

## 2013-01-24 NOTE — OR Nursing (Signed)
Tegaderm dressing applied, site level 0, bedrest begins at 1140

## 2013-01-26 ENCOUNTER — Other Ambulatory Visit: Payer: Self-pay | Admitting: Cardiology

## 2013-01-30 ENCOUNTER — Other Ambulatory Visit: Payer: Self-pay | Admitting: Cardiology

## 2013-01-30 ENCOUNTER — Encounter: Payer: Self-pay | Admitting: Cardiology

## 2013-01-31 ENCOUNTER — Ambulatory Visit (HOSPITAL_COMMUNITY)
Admission: RE | Admit: 2013-01-31 | Discharge: 2013-01-31 | Disposition: A | Discharge status: Home / Self Care | Payer: Medicare Other | Source: Ambulatory Visit | Attending: Cardiology | Admitting: Cardiology

## 2013-01-31 ENCOUNTER — Encounter (HOSPITAL_COMMUNITY)
Admission: RE | Disposition: A | Discharge status: Home / Self Care | Payer: Self-pay | Source: Ambulatory Visit | Attending: Cardiology

## 2013-01-31 ENCOUNTER — Encounter (HOSPITAL_COMMUNITY): Payer: Self-pay | Admitting: *Deleted

## 2013-01-31 DIAGNOSIS — I340 Nonrheumatic mitral (valve) insufficiency: Secondary | ICD-10-CM

## 2013-01-31 DIAGNOSIS — I059 Rheumatic mitral valve disease, unspecified: Secondary | ICD-10-CM | POA: Insufficient documentation

## 2013-01-31 DIAGNOSIS — I341 Nonrheumatic mitral (valve) prolapse: Secondary | ICD-10-CM

## 2013-01-31 HISTORY — PX: TEE WITHOUT CARDIOVERSION: SHX5443

## 2013-01-31 SURGERY — ECHOCARDIOGRAM, TRANSESOPHAGEAL
Anesthesia: Moderate Sedation

## 2013-01-31 MED ORDER — FENTANYL CITRATE 0.05 MG/ML IJ SOLN
INTRAMUSCULAR | Status: DC | PRN
  Administered 2013-01-31: 12.5 ug via INTRAVENOUS
  Administered 2013-01-31: 25 ug via INTRAVENOUS

## 2013-01-31 MED ORDER — LIDOCAINE VISCOUS 2 % MT SOLN
OROMUCOSAL | Status: AC
  Filled 2013-01-31: qty 15

## 2013-01-31 MED ORDER — METOPROLOL TARTRATE 1 MG/ML IV SOLN
2.5000 mg | Freq: Once | INTRAVENOUS | Status: AC
  Administered 2013-01-31: 2.5 mg via INTRAVENOUS

## 2013-01-31 MED ORDER — SODIUM CHLORIDE 0.9 % IV SOLN: INTRAVENOUS | Status: DC

## 2013-01-31 MED ORDER — MIDAZOLAM HCL 10 MG/2ML IJ SOLN
INTRAMUSCULAR | Status: DC | PRN
  Administered 2013-01-31 (×3): 1 mg via INTRAVENOUS

## 2013-01-31 MED ORDER — FENTANYL CITRATE 0.05 MG/ML IJ SOLN
INTRAMUSCULAR | Status: AC
  Filled 2013-01-31: qty 2

## 2013-01-31 MED ORDER — MIDAZOLAM HCL 5 MG/ML IJ SOLN
INTRAMUSCULAR | Status: AC
  Filled 2013-01-31: qty 2

## 2013-01-31 MED ORDER — METOPROLOL TARTRATE 1 MG/ML IV SOLN
INTRAVENOUS | Status: AC
  Filled 2013-01-31: qty 5

## 2013-01-31 NOTE — Interval H&P Note (Signed)
History and Physical Interval Note:  01/31/2013 12:26 PM  [name]  has presented today for surgery, with the diagnosis of mitral valve  The various methods of treatment have been discussed with the patient and family. After consideration of risks, benefits and other options for treatment, the patient has consented to  Procedure(s): TRANSESOPHAGEAL ECHOCARDIOGRAM (TEE) (N/A) as a surgical intervention .  The patient's history has been reviewed, patient examined, no change in status, stable for surgery.  I have reviewed the patient's chart and labs.  Questions were answered to the patient's satisfaction.     TURNER,TRACI R

## 2013-01-31 NOTE — CV Procedure (Signed)
PROCEDURE NOTE:  Procedure: Transesophageal echocardiogram Operator:   Fransico Him, MD Indications:  Severe Mitral Regurgitation Complications: IV Meds: Versed 3mg , Fentanyl 54mcg IV  Results: Normal LV size and function Normal RV size and function Normal TV with mild to moderate MR Normal PV with mild PR Trileaflet AV with moderate AV sclerosis and no stenosis   Myxomatous posterior MV leaflet with severe holosystolic MVP and severe mitral regurgitation directed eccentrically towards the anterior wall and wraps around the entire LA with flow into the pulmonary veins.  There is evidence of intermittent flow reversal in the pulmonary vein c/w severe MR.  Moderately enlarged LA with spontaneous echo contrast.    Normal RA and normal interatrial septum with no evidence of PFO.    Mild atherosclerosis of the thoracic aorta.  The patient tolerated the procedure well and was transferred back to his room in stable condition.    No complications noted.  IMPRESSION:  Severe MV prolapse of the posterior MV leaflet with severe MR  PLAN:  CVST with Dr. Ricard Dillon for MV repair Continue rate control for afib  Continue Coumadin

## 2013-01-31 NOTE — Progress Notes (Signed)
  Echocardiogram Echocardiogram Transesophageal has been performed.  [name] 01/31/2013, 12:59 PM

## 2013-01-31 NOTE — H&P (View-Only) (Signed)
Office Visit     [name], [name] Provider: Fransico Him, MD  DOB: 1929/06/20 Age: 79 Y Sex: Male Date: 01/17/2013  Phone: [phone]   Address: [address], [place], [place]  Pcp: Lavone Orn       Subjective:     CC:    1. TT/ECHO F/u.        HPI:  General:  The patient presents today for evaluation of atrial fibrillation and CHF. He recently saw his primary MD for increased fatigue, DOE and LE edema. He was found to be in atrial fibrillation at that time. He denies any chest pain but possibly some mild chest pressure but he is vague in his description. He has had episodes of palpitations but denies any dizziness or syncope..        ROS:  See HPI, A twelve system review was perfomed at today's visit. For pertinent positives and negatives see HPI.       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        Family History:        Social History:  General:  History of smoking cigarettes: Former smoker, Quit in year 1962, Pack-year Hx: 20.  no Smoking.  Alcohol: yes, 2 drink a day.  no Recreational drug use.  Exercise: PT for knee.  Occupation: Retired Chief Financial Officer, [employer].  Marital Status: Married, wife lung cancer.        Medications: Glucosamine HCl 1500 MG Tablet 2 tablets once a day, Vitamin D 2000 UNIT Tablet 1 tablet with a meal Once a day, Ibuprofen 800 MG Tablet 1 tablet as needed once a day, Welchol 3.75 GM Packet 1 packet mixed with water with a meal Once  a day, Metoprolol Tartrate 25 MG Tablet 1/2 tablet Twice a day, Aspirin 325 MG Tablet 1 tablet Once a day, Furosemide 40 MG Tablet 1 tablet 2 tablets for three days then back to 1 tablet, Medication List reviewed and reconciled with the patient       Allergies: Statins: Myalgias.       Objective:     Vitals: Wt 167.6, Wt change -3.8 lb, Ht 68.5, BMI 25.11, Pulse sitting 74, BP sitting 132/78.       Examination:        Assessment:     Assessment:  1. Mitral regurgitation - 424.0 (Primary)  2. Atrial fibrillation - 427.31 (Primary)  3. Acute diastolic heart failure - [id number]  4. Mitral valve prolapse - 424.0    Plan:     1. Mitral regurgitation  Diagnostic Imaging:Cardiac Cath (Ordered for 01/24/2013)  Risks and benefits of cardiac catheterization have been reviewed including risk of stroke, heart attack, death, bleeding, renal impariment and arterial damage. There was ample oppurtuny to answer questions. Alternatives were discussed. Patient understands and wishes to proceed.       2. Atrial fibrillation  LAB: PT (Prothrombin Time) [id number]) okay for cath    Prothrombin Time 10.9 9.1-12.0 - SEC    INR 1.0 0.8-1.2 -     Smart,Jeremy 01/18/2013 08:27:53 AM > okay for cath TURNER,TRACI M 01/19/2013 10:29:50 AM >  LAB: Basic Metabolic    GLUCOSE 87 123456 - mg/dL    BUN 22 6-26 - mg/dL    CREATININE 1.51 0.60-1.30 - mg/dl H   eGFR (NON-AFRICAN AMERICAN) 44 >60 - calc L   eGFR (AFRICAN AMERICAN) 54 >60 - calc L   SODIUM 140 136-145 - mmol/L    POTASSIUM 4.3 3.5-5.5 - mmol/L    CHLORIDE 102 98-107 - mmol/L    C02 32 22-32 - mmol/L    ANION GAP 11.1 6.0-20.0 - mmol/L    CALCIUM 10.0 8.6-10.3 - mg/dL     TURNER,TRACI M 01/19/2013 03:33:28 PM >decreased renal function - please have patient stop his Ibuprofen and cut lasix back to 20mg  daily and hold the lasix the day before and the day of cath and recheck BMET the day before cath    LAB: CBC with Diff Normal    WBC 6.2  4.0-11.0 - K/ul    RBC 4.59 4.20-5.80 - M/uL    HGB 13.1 13.0-17.0 - g/dL    HCT 42.3 39.0-52.0 - %    MCH 28.6 27.0-33.0 - pg    MPV 9.9 7.5-10.7 - fL    MCV 92.1 80.0-94.0 - fL    MCHC 31.1 32.0-36.0 - g/dL L   RDW 13.4 11.5-15.5 - %    NRBC# 0.00 -    PLT 181 150-400 - K/uL    NEUT % 68.3 43.3-71.9 - %    NRBC% 0.00 - %    LYMPH% 16.2 16.8-43.5 - % L   MONO % 10.9 4.6-12.4 - %    EOS % 3.8 0.0-7.8 - %    BASO % 0.8 0.0-1.0 - %    NEUT # 4.2 1.9-7.2 - K/uL    LYMPH# 1.00 1.10-2.70 - K/uL L   MONO # 0.7 0.3-0.8 - K/uL    EOS # 0.2 0.0-0.6 - K/uL    BASO # 0.1 0.0-0.1 - K/uL     TURNER,TRACI M 01/19/2013 11:54:09 AM >       3. Acute diastolic heart failure Continue Metoprolol Tartrate Tablet, 25 MG, 1/2 tablet, Orally, Twice a day ; Continue Furosemide Tablet, 40 MG, 1 tablet, Orally, 2 tablets for three days then back to 1 tablet ; Continue Aspirin Tablet, 325 MG, 1 tablet, Orally, Once a day .        Procedures:  Venipuncture:  Venipuncture: Smith,Michele 01/17/2013 03:06:40 PM > , performed in right arm.        Immunizations:        Labs:        Procedure Codes: [id number] ECL BMP, [id number] ECL CBC PLATELET DIFF, [id number] BLOOD COLLECTION ROUTINE VENIPUNCTURE       Preventive:         Follow Up: cath      Provider: Fransico Him, MD  [name], [name] DOB: Sep 13, 1929 Date: 01/17/2013

## 2013-02-01 ENCOUNTER — Encounter (HOSPITAL_COMMUNITY): Payer: Self-pay | Admitting: Cardiology

## 2013-02-10 ENCOUNTER — Institutional Professional Consult (permissible substitution) (INDEPENDENT_AMBULATORY_CARE_PROVIDER_SITE_OTHER): Payer: Medicare Other | Admitting: Thoracic Surgery (Cardiothoracic Vascular Surgery)

## 2013-02-10 ENCOUNTER — Encounter: Payer: Medicare Other | Admitting: Thoracic Surgery (Cardiothoracic Vascular Surgery)

## 2013-02-10 VITALS — BP 129/84 | HR 100 | Resp 20 | Ht 70.0 in | Wt 168.0 lb

## 2013-02-10 DIAGNOSIS — I341 Nonrheumatic mitral (valve) prolapse: Secondary | ICD-10-CM

## 2013-02-10 DIAGNOSIS — I4891 Unspecified atrial fibrillation: Secondary | ICD-10-CM

## 2013-02-10 DIAGNOSIS — I340 Nonrheumatic mitral (valve) insufficiency: Secondary | ICD-10-CM

## 2013-02-10 DIAGNOSIS — I059 Rheumatic mitral valve disease, unspecified: Secondary | ICD-10-CM

## 2013-02-10 HISTORY — DX: Unspecified atrial fibrillation: I48.91

## 2013-02-10 MED ORDER — AMIODARONE HCL 200 MG PO TABS: 200.0000 mg | ORAL_TABLET | Freq: Two times a day (BID) | ORAL | Status: DC

## 2013-02-10 NOTE — Patient Instructions (Signed)
Stop coumadin (warfarin) and begin amiodarone 7 days prior to surgery (take your last dose of coumadin on Thursday March 27)

## 2013-02-10 NOTE — Progress Notes (Signed)
[address]            [place] [address]          [phone]     CARDIOTHORACIC SURGERY CONSULTATION REPORT  Referring Provider is Sueanne Margarita, MD PCP is Irven Shelling, MD  Chief Complaint  Patient presents with  . Mitral Valve Prolapse    surgical eval for mitral valve repair, Cardiac Cath 02/03/13, TEE 01/31/13    HPI:  Patient is an 79 year old married white male from [place] who has no previous cardiac history and has remained fairly active and relatively healthy most of his life. States that over the past 4-6 months he has developed exertional shortness of breath, decreased energy, and increased lower extremity swelling. He states that he first developed swelling in his legs last summer after he had knee surgery. However he developed swelling in both legs, not just the left side which was operated on.  Since then he gradually developed exertional shortness of breath and fatigue. He states that occasionally gets short of breath when he tries to lie flat in bed. He has not had any chest pain or chest tightness either with activity or at rest. He denies resting shortness of breath other than when he lays flat in bed. He has not had dizzy spells nor palpitations.  He went to see his primary care physician and was found to be and persistent atrial fibrillation. He was referred to Dr. Radford Pax for cardiology consultation. An echocardiogram was performed demonstrating mitral valve prolapse with severe mitral regurgitation. Left ventricular size and systolic function appeared normal with ejection fraction 71%. There was mild left atrial enlargement. The patient subsequently underwent transesophageal echocardiogram which confirmed the presence of mitral valve prolapse with a flail segment of the posterior leaflet and severe mitral regurgitation. Cardiac catheterization was performed and notable for the absence of significant coronary artery disease. The  patient has been referred for possible surgical intervention.  Past Medical History  Diagnosis Date  . Heart palpitations     PT STATES NOT REALLY A BIG PROBLEM--DENIES CHEST PAINS  . Insomnia   . BPH (benign prostatic hypertrophy)   . Constipation   . History of torn meniscus of left knee     PAINFUL LEFT KNEE  . Hearing loss     BILATERAL - WEARS HEARING AIDS  . Hyperlipidemia   . Mitral valve prolapse   . Mitral valve regurgitation   . Sleep apnea     USES CPAP EVERY NIGHT  . OSA on CPAP   . Erectile dysfunction   . CHF (congestive heart failure)   . Dysrhythmia     a fib  . Cancer     SKIN CANCERS  . Arthritis     HANDS  . Hyperlipidemia   . Atrial fibrillation 02/10/2013    Past Surgical History  Procedure Laterality Date  . Skin cancers removed from left forehead and left nose    . Skin cancers removed from both legs    . Surgery left neck for removal of metastatic lymph node ( from cancer left forehead)  and left neck dissection-rest of lymph nodes negative for any cancer    . Carpal tunnel release      BILATERAL  . Right rotator cuff repair    . Tee without cardioversion N/A 01/31/2013    Procedure: TRANSESOPHAGEAL ECHOCARDIOGRAM (TEE);  Surgeon: Eber Hong  Radford Pax, MD;  Location: [facility];  Service: Cardiovascular;  Laterality: N/A;  . Hemorrhoid surgery      1973    Family History  Problem Relation Age of Onset  . Coronary artery disease Father   . Heart disease Father   . Diabetes Mother   . Arthritis Mother   . Congestive Heart Failure Mother   . Alzheimer's disease Mother     History   Social History  . Marital Status: Married    Spouse Name: N/A    Number of Children: 2  . Years of Education: N/A   Occupational History  . retired Art gallery manager    Social History Main Topics  . Smoking status: Former Smoker -- 1.00 packs/day for 20 years    Start date: 11/23/1960  . Smokeless tobacco: Not on file     Comment: [place]  .  Alcohol Use: 3.5 oz/week    7 drink(s) per week     Comment: COCKTAIL AND GLASS OF WINE WITH DINNER -DAILY  . Drug Use: No  . Sexually Active: Not on file   Other Topics Concern  . Not on file   Social History Narrative   Wife has stage IV lung cancer      Patient has remained physically active and continues to drive, take care of yard work and chores, and enjoys playing golf    Current Outpatient Prescriptions  Medication Sig Dispense Refill  . cetirizine (ZYRTEC) 10 MG tablet Take 10 mg by mouth as needed for allergies.      . cholecalciferol (VITAMIN D) 1000 UNITS tablet Take 1,000 Units by mouth every morning.      . colesevelam (WELCHOL) 625 MG tablet Take 1,875 mg by mouth daily.       . furosemide (LASIX) 20 MG tablet Take 2 tablets (40 mg total) by mouth daily.  60 tablet  11  . Glucosamine-Chondroit-Vit C-Mn (GLUCOSAMINE CHONDR 500 COMPLEX) CAPS Take 2 capsules by mouth daily.       . metoprolol tartrate (LOPRESSOR) 25 MG tablet Take 25 mg by mouth 2 (two) times daily. 1/2 tablet twice daily      . warfarin (COUMADIN) 5 MG tablet Take 1 tablet (5 mg total) by mouth daily.  30 tablet  11   No current facility-administered medications for this visit.    Allergies  Allergen Reactions  . Statins     Muscle pains  . Latex Rash    bandaids--no other latex problems      Review of Systems:   General:  normal appetite, decreased energy, no weight gain, no weight loss, no fever  Cardiac:  no chest pain with exertion, no chest pain at rest, +SOB with exertion, no resting SOB, no PND, + orthopnea, no palpitations, + arrhythmia, + atrial fibrillation, + LE edema, no dizzy spells, no syncope  Respiratory:  + shortness of breath, no home oxygen, no productive cough, no dry cough, no bronchitis, no wheezing, no hemoptysis, no asthma, no pain with inspiration or cough, + sleep apnea, + CPAP at night  GI:   no difficulty swallowing, no reflux, no frequent heartburn, no hiatal  hernia, no abdominal pain, + constipation, no diarrhea, no hematochezia, no hematemesis, no melena  GU:   no dysuria,  + frequency, no urinary tract infection, no hematuria, + enlarged prostate, no kidney stones, no kidney disease  Vascular:  no pain suggestive of claudication, no pain in feet, no leg cramps, no varicose veins, no DVT,  no non-healing foot ulcer  Neuro:   no stroke, no TIA's, no seizures, no headaches, no temporary blindness one eye,  no slurred speech, no peripheral neuropathy, + mild chronic pain in left knee, mild instability of gait, + memory/cognitive dysfunction - somewhat forgetful at times and family has noticed some episodes of confusion, disorientation  Musculoskeletal: + arthritis, + joint swelling, no myalgias, mild difficulty walking, mildly limited mobility   Skin:   no rash, no itching, no skin infections, no pressure sores or ulcerations  Psych:   no anxiety, no depression, no nervousness, + unusual recent stress due to wife battling stage IV lung cancer  Eyes:   no blurry vision, no floaters, no recent vision changes, + wears glasses or contacts  ENT:   + hearing loss, no loose or painful teeth, no dentures, last saw dentist October 2013  Hematologic:  + easy bruising, no abnormal bleeding, no clotting disorder, no frequent epistaxis  Endocrine:  no diabetes, does not check CBG's at home     Physical Exam:   BP 129/84  Pulse 100  Resp 20  Ht 5\' 10"  (1.778 m)  Wt 168 lb (76.204 kg)  BMI 24.11 kg/m2  SpO2 93%  General:  Thin,  well-appearing  HEENT:  Unremarkable   Neck:   no JVD, no bruits, no adenopathy   Chest:   clear to auscultation, symmetrical breath sounds, no wheezes, no rhonchi   CV:   Irregular rate and rhythm, grade IV/VI blowing holosystolic murmur   Abdomen:  soft, non-tender, no masses   Extremities:  warm, well-perfused, pulses diminished at ankles, good left femoral pulse, mildly diminished right femoral pulse, 2+ bilateral LE  edema  Rectal/GU  Deferred  Neuro:   Grossly non-focal and symmetrical throughout  Skin:   Clean and dry, no rashes, few scrapes both legs, thin delicate skin    Diagnostic Tests:  Transthoracic Echocardiogram  Transthoracic echocardiogram performed 01/16/2013 at [facility] cardiology is reviewed. There is mitral valve prolapse with what appears to be a flail segment of the posterior leaflet of the mitral valve and severe mitral regurgitation. The jet of regurgitation is eccentric and directed anteriorly. There is mild tricuspid regurgitation. Left ventricular size and systolic function appears normal. Ejection fraction was calculated 72%. Pulmonary artery pressures were estimated to be elevated. There is mild left atrial enlargement.   Transesophageal Echocardiogram Procedure: Transesophageal echocardiogram  Operator: Fransico Him, MD  Indications: Severe Mitral Regurgitation  Complications:  IV Meds: Versed 3mg , Fentanyl 39mcg IV  Results:  Normal LV size and function  Normal RV size and function  Normal TV with mild to moderate MR  Normal PV with mild PR  Trileaflet AV with moderate AV sclerosis and no stenosis  Myxomatous posterior MV leaflet with severe holosystolic MVP and severe mitral regurgitation directed eccentrically towards the anterior wall and wraps around the entire LA with flow into the pulmonary veins. There is evidence of intermittent flow reversal in the pulmonary vein c/w severe MR.  Moderately enlarged LA with spontaneous echo contrast.  Normal RA and normal interatrial septum with no evidence of PFO.  Mild atherosclerosis of the thoracic aorta.  The patient tolerated the procedure well and was transferred back to his room in stable condition.  No complications noted.  IMPRESSION:  Severe MV prolapse of the posterior MV leaflet with severe MR  PLAN:  CVST with Dr. Ricard Dillon for MV repair  Continue rate control for afib  Continue Coumadin  Cardiac Cath  Procedure Note  Indication:  Procedures performed:  1) Right heart cathererization  2) Selective coronary angiography  3) Left heart catheterization  4) Left ventriculogram  Description of procedure:   The risks and indication of the procedure were explained. Consent was signed and placed on the chart. An appropriate timeout was taken prior to the procedure. The right groin was prepped and draped in the routine sterile fashion and anesthetized with 1% local lidocaine.  A 5 FR arterial sheath was placed in the right femoral artery using a modified Seldinger technique. Standard catheters including a JL4, JR4 and angled pigtail were used. All catheter exchanges were made over a wire. A 7 FR venous sheath was placed in the right femoral vein using a modified Seldinger technique. A standard Swan-Ganz catheter was used for the procedure.  Complications: None apparent  Findings:  RA = 12/68mmHg with mean 51mmHg  RV = 42/7mmHg with mean 53mmHg  PA = 42/98mmHg with a mean of 104mmHg  PCW =42/19mmHg with a mean of 106mmHg and large V waves  Fick cardiac output/index =3.5/1.8  FA sat =95%  PA sat =57%  RA sat- 54%  Ao Pressure:99/3mmHg  LV Pressure:90/72mmHg  There was no signficant gradient across the aortic valve on pullback.  Left main: widely patent and bifurcates into an LAD and left circumflex arteries.  LAD: Widely patent throughout its course and gives rise to a moderate first diagonal which is patent and then a small second diagonal which is patent.  [id number] patent throughout its course. It gives rise to a large OM 1 which has a 30-40% stenosis in the ostium. It bifurcates into 2 daughter branches which are widely patent. This is a left dominant system.  [id number] patent, small and nondomiant  LV-gram done in the RAO projection: not done due to elevated PCW pressure  Assessment:  1. Nonobstructive ASCAD with a30-40% stenosis in the ostium of the OM1 otherwise normal coronary arteries.  2.  Normal LVF by echo  3. Severe posterior MVP with severe eccentric MR by echo  4. Mild pulmonary HTN by right heart cath.  5. Atrial fibrilation - patient now back in afib  Plan/Discussion:  1. D/C home once IVF and bedrest complete  2. Outpatient TEE will be set up  3. Start coumadin due to recurrent afib  4. CVTS consult with Dr. Otilio Jefferson, MD  11:41 AM   Impression:  The patient has mitral valve prolapse with flail segment of the posterior leaflet and severe symptomatic mitral regurgitation. The patient presents with persistent atrial fibrillation and a 4-6 month history of symptoms of exertional shortness of breath, fatigue, orthopnea, and bilateral lower extremity edema. Left ventricular systolic function appears preserved. The patient does not have significant coronary artery disease and pulmonary artery pressures were only moderately elevated at the time of catheterization. Based upon review of the patient's transthoracic and transesophageal echocardiograms I feel there is a high likelihood that his valve should be repairable. Risks associated with surgery will be slightly elevated due to the patient's advanced age and what sounds to be very mild cognitive impairment with some memory loss and occasional episodes of confusion or disorientation.  The patient may be a relatively good candidate for minimally invasive approach for surgery.   Plan:  I spent in excess of 45 minutes with the patient and his 2 daughters in the office discussing matters at length.  The rationale for elective mitral valve repair surgery has been explained, including  a comparison between surgery and continued medical therapy with close follow-up.  The likelihood of successful and durable valve repair has been discussed with particular reference to the findings of their recent echocardiogram.  Based upon these findings and previous experience, I have quoted them a greater than 95 percent likelihood of  successful valve repair.  Alternative surgical approaches have been discussed, including a comparison between conventional sternotomy and minimally-invasive techniques.  The relative risks and benefits of each have been reviewed as they pertain to the patient's specific circumstances, and all of their questions have been addressed.  The patient understands and accepts all potential associated risks of surgery including but not limited to risk of death, stroke, myocardial infarction, congestive heart failure, respiratory failure, renal failure, bleeding requiring blood transfusion and/or reexploration, arrhythmia, heart block or bradycardia requiring permanent pacemaker, pneumonia, pleural effusion, wound infection, pulmonary embolus or other thromboembolic complication, chronic pain or other delayed complications related to valve repair.  All questions answered.  We tentatively plan to proceed with surgery on Thursday, April 3. We will obtain CT angiogram of the chest abdomen and pelvis to rule out the presence of significant atherosclerotic disease of the aorta or iliac vessels which might preclude safe femoral cannulation for surgery. The patient has been given a prescription for amiodarone to begin one week prior to surgery. At that time he will stop taking Coumadin in anticipation of surgery. The patient will be seen in followup on Monday, March 31 prior to surgery to review the results of the CT scan and make final plans.       Valentina Gu. Roxy Manns, MD 02/10/2013 5:58 PM

## 2013-02-13 ENCOUNTER — Encounter (HOSPITAL_COMMUNITY): Payer: Self-pay | Admitting: Pharmacy Technician

## 2013-02-13 ENCOUNTER — Other Ambulatory Visit: Payer: Self-pay | Admitting: *Deleted

## 2013-02-13 DIAGNOSIS — I059 Rheumatic mitral valve disease, unspecified: Secondary | ICD-10-CM

## 2013-02-14 ENCOUNTER — Other Ambulatory Visit: Payer: Self-pay

## 2013-02-14 DIAGNOSIS — I059 Rheumatic mitral valve disease, unspecified: Secondary | ICD-10-CM

## 2013-02-17 ENCOUNTER — Inpatient Hospital Stay: Admission: RE | Admit: 2013-02-17 | Payer: Medicare Other | Source: Ambulatory Visit

## 2013-02-17 ENCOUNTER — Ambulatory Visit
Admission: RE | Admit: 2013-02-17 | Discharge: 2013-02-17 | Disposition: A | Discharge status: Home / Self Care | Payer: Medicare Other | Source: Ambulatory Visit | Attending: Thoracic Surgery (Cardiothoracic Vascular Surgery) | Admitting: Thoracic Surgery (Cardiothoracic Vascular Surgery)

## 2013-02-17 DIAGNOSIS — I059 Rheumatic mitral valve disease, unspecified: Secondary | ICD-10-CM

## 2013-02-17 MED ORDER — IOHEXOL 300 MG/ML  SOLN
80.0000 mL | Freq: Once | INTRAMUSCULAR | Status: AC | PRN
  Administered 2013-02-17: 80 mL via INTRAVENOUS

## 2013-02-20 ENCOUNTER — Ambulatory Visit (HOSPITAL_COMMUNITY)
Admission: RE | Admit: 2013-02-20 | Discharge: 2013-02-20 | Disposition: A | Discharge status: Home / Self Care | Payer: Medicare Other | Source: Ambulatory Visit | Attending: Thoracic Surgery (Cardiothoracic Vascular Surgery) | Admitting: Thoracic Surgery (Cardiothoracic Vascular Surgery)

## 2013-02-20 ENCOUNTER — Encounter: Payer: Self-pay | Admitting: Thoracic Surgery (Cardiothoracic Vascular Surgery)

## 2013-02-20 ENCOUNTER — Encounter (HOSPITAL_COMMUNITY): Payer: Self-pay

## 2013-02-20 ENCOUNTER — Encounter (HOSPITAL_COMMUNITY)
Admission: RE | Admit: 2013-02-20 | Discharge: 2013-02-20 | Disposition: A | Discharge status: Home / Self Care | Payer: Medicare Other | Source: Ambulatory Visit | Attending: Thoracic Surgery (Cardiothoracic Vascular Surgery) | Admitting: Thoracic Surgery (Cardiothoracic Vascular Surgery)

## 2013-02-20 ENCOUNTER — Ambulatory Visit (INDEPENDENT_AMBULATORY_CARE_PROVIDER_SITE_OTHER): Payer: Medicare Other | Admitting: Thoracic Surgery (Cardiothoracic Vascular Surgery)

## 2013-02-20 VITALS — BP 145/95 | HR 81 | Resp 20 | Ht 72.0 in | Wt 169.0 lb

## 2013-02-20 VITALS — BP 138/93 | HR 99 | Temp 97.9°F | Resp 20 | Ht 72.0 in | Wt 169.4 lb

## 2013-02-20 DIAGNOSIS — I340 Nonrheumatic mitral (valve) insufficiency: Secondary | ICD-10-CM

## 2013-02-20 DIAGNOSIS — Z01818 Encounter for other preprocedural examination: Secondary | ICD-10-CM | POA: Insufficient documentation

## 2013-02-20 DIAGNOSIS — I4891 Unspecified atrial fibrillation: Secondary | ICD-10-CM

## 2013-02-20 DIAGNOSIS — G4730 Sleep apnea, unspecified: Secondary | ICD-10-CM | POA: Insufficient documentation

## 2013-02-20 DIAGNOSIS — I509 Heart failure, unspecified: Secondary | ICD-10-CM | POA: Insufficient documentation

## 2013-02-20 DIAGNOSIS — I059 Rheumatic mitral valve disease, unspecified: Secondary | ICD-10-CM

## 2013-02-20 DIAGNOSIS — Z01811 Encounter for preprocedural respiratory examination: Secondary | ICD-10-CM | POA: Insufficient documentation

## 2013-02-20 DIAGNOSIS — Z01810 Encounter for preprocedural cardiovascular examination: Secondary | ICD-10-CM

## 2013-02-20 DIAGNOSIS — I341 Nonrheumatic mitral (valve) prolapse: Secondary | ICD-10-CM

## 2013-02-20 DIAGNOSIS — Z01812 Encounter for preprocedural laboratory examination: Secondary | ICD-10-CM | POA: Insufficient documentation

## 2013-02-20 LAB — CBC
MCV: 87.7 fL (ref 78.0–100.0)
Platelets: 150 10*3/uL (ref 150–400)
RDW: 13.3 % (ref 11.5–15.5)
WBC: 6.2 10*3/uL (ref 4.0–10.5)

## 2013-02-20 LAB — PULMONARY FUNCTION TEST

## 2013-02-20 LAB — BLOOD GAS, ARTERIAL
Acid-base deficit: 0.2 mmol/L (ref 0.0–2.0)
Drawn by: 344381
O2 Content: 2 L/min
pCO2 arterial: 35.7 mmHg (ref 35.0–45.0)
pH, Arterial: 7.435 (ref 7.350–7.450)

## 2013-02-20 LAB — COMPREHENSIVE METABOLIC PANEL
AST: 37 U/L (ref 0–37)
Albumin: 4.4 g/dL (ref 3.5–5.2)
Chloride: 100 mEq/L (ref 96–112)
Creatinine, Ser: 1.82 mg/dL — ABNORMAL HIGH (ref 0.50–1.35)
Total Bilirubin: 0.7 mg/dL (ref 0.3–1.2)

## 2013-02-20 LAB — PROTIME-INR
INR: 1.18 (ref 0.00–1.49)
Prothrombin Time: 14.8 seconds (ref 11.6–15.2)

## 2013-02-20 LAB — URINALYSIS, ROUTINE W REFLEX MICROSCOPIC
Bilirubin Urine: NEGATIVE
Hgb urine dipstick: NEGATIVE
Ketones, ur: NEGATIVE mg/dL
Protein, ur: NEGATIVE mg/dL
Urobilinogen, UA: 0.2 mg/dL (ref 0.0–1.0)

## 2013-02-20 LAB — HEMOGLOBIN A1C: Mean Plasma Glucose: 126 mg/dL — ABNORMAL HIGH (ref <117)

## 2013-02-20 LAB — TYPE AND SCREEN
ABO/RH(D): O POS
Antibody Screen: NEGATIVE

## 2013-02-20 LAB — SURGICAL PCR SCREEN
MRSA, PCR: NEGATIVE
Staphylococcus aureus: NEGATIVE

## 2013-02-20 MED ORDER — CHLORHEXIDINE GLUCONATE 4 % EX LIQD: 30.0000 mL | CUTANEOUS | Status: DC

## 2013-02-20 MED ORDER — AMIODARONE HCL 200 MG PO TABS: 200.0000 mg | ORAL_TABLET | Freq: Two times a day (BID) | ORAL | Status: DC

## 2013-02-20 MED ORDER — ALBUTEROL SULFATE (5 MG/ML) 0.5% IN NEBU
2.5000 mg | INHALATION_SOLUTION | Freq: Once | RESPIRATORY_TRACT | Status: AC
  Administered 2013-02-20: 2.5 mg via RESPIRATORY_TRACT

## 2013-02-20 NOTE — Progress Notes (Signed)
[address]            [place] [address]          [phone]     CARDIOTHORACIC SURGERY OFFICE NOTE  Referring Provider is Sueanne Margarita, MD PCP is Irven Shelling, MD   HPI:  Patient returns for followup of severe symptomatic mitral regurgitation with tentatively plans to proceed with elective mitral valve repair later this week. However, last week the patient injured himself working in the ER and suffered a fairly severe laceration to the palmar surface of his right forearm. This required multiple sutures for repair, and the patient subsequently developed significant surrounding cellulitis for which she is being treated with oral antibiotics. He states that the redness has improved. He otherwise feels well.   Current Outpatient Prescriptions  Medication Sig Dispense Refill  . amiodarone (PACERONE) 200 MG tablet Take 1 tablet (200 mg total) by mouth 2 (two) times daily. Begin 7 days prior to surgery.  60 tablet  0  . cetirizine (ZYRTEC) 10 MG tablet Take 10 mg by mouth as needed for allergies.      . cholecalciferol (VITAMIN D) 1000 UNITS tablet Take 2,000 Units by mouth every morning.       . clindamycin (CLEOCIN) 150 MG capsule Take 150 mg by mouth 4 (four) times daily.       . Colesevelam HCl (WELCHOL) 3.75 G PACK Take 1 packet by mouth every evening.      . furosemide (LASIX) 20 MG tablet Take 2 tablets (40 mg total) by mouth daily.  60 tablet  11  . Glucosamine-Chondroit-Vit C-Mn (GLUCOSAMINE CHONDR 500 COMPLEX) CAPS Take 2 capsules by mouth daily.       . metoprolol tartrate (LOPRESSOR) 25 MG tablet Take 12.5 mg by mouth 2 (two) times daily.       Marland Kitchen warfarin (COUMADIN) 5 MG tablet Take 5 mg by mouth daily. HOLDING PRIOR TO SURGERY ON 02/23/13       No current facility-administered medications for this visit.   Facility-Administered Medications Ordered in Other Visits  Medication Dose Route Frequency Provider Last Rate Last Dose  .  chlorhexidine (HIBICLENS) 4 % liquid 2 application  30 mL Topical UD Rexene Alberts, MD          Physical Exam:   BP 145/95  Pulse 81  Resp 20  Ht 6' (1.829 m)  Wt 169 lb (76.658 kg)  BMI 22.92 kg/m2  SpO2 97%  General:  Well-appearing  Chest:   Clear to auscultation  CV:   Regular rate and rhythm with systolic murmur  Incisions:  Right forearm incision is healing but there is significant surrounding cellulitis  Abdomen:  Soft and nontender  Extremities:  Warm and adequately perfused  Diagnostic Tests:  *RADIOLOGY REPORT*  Clinical Data: Preoperative evaluation for mitral regurgitation.  CT ANGIOGRAPHY CHEST  Technique: Multidetector CT imaging of the chest using the  standard protocol during bolus administration of intravenous  contrast. Multiplanar reconstructed images including MIPs were  obtained and reviewed to evaluate the vascular anatomy.  Contrast: 40mL OMNIPAQUE IOHEXOL 300 MG/ML SOLN  Comparison: Chest CT 03/29/2006.  Findings:  Mediastinum: Heart size is mildly enlarged with biatrial dilatation  and right ventricular dilatation. There is no significant  pericardial fluid, thickening or pericardial calcification. There  is atherosclerosis of the thoracic aorta, the great vessels of the  mediastinum  and the coronary arteries, including calcified  atherosclerotic plaque in the left main, left anterior descending  and right coronary arteries. Numerous reactive size mediastinal and  hilar lymph nodes. No definite pathologic nodal enlargement.  Esophagus is unremarkable in appearance.  Lungs/Pleura: Moderate right and small left-sided pleural effusions  are simple in appearance and layer posteriorly. This is associated  with minimal passive atelectasis in the lower lobes of the lungs  bilaterally. Some fluid is also seen tracking in the minor  fissure. No acute consolidative airspace disease. No suspicious  appearing pulmonary nodules or masses.  Upper Abdomen:  Multifocal parenchymal thinning in the kidneys  bilaterally. Atherosclerosis.  Musculoskeletal: There are no aggressive appearing lytic or blastic  lesions noted in the visualized portions of the skeleton.  Multilevel degenerative disc disease, most pronounced at T5-T6  where there is a large disc osteophyte complex posteriorly which  significantly impinges upon the central spinal canal resulting in  an AP diameter of only 7 mm.  IMPRESSION:  1. Cardiomegaly with right ventricular and biatrial dilatation.  2. Moderate right and small left-sided pleural effusions are  simple in appearance of layering posteriorly.  3. Atherosclerosis, including left main and two-vessel coronary  artery disease. assessment for potential risk factor modification,  dietary therapy or pharmacologic therapy may be warranted, if  clinically indicated.  4. Large disc/osteophyte complex extending posterior to T5-T6  resulting in narrowing of the central spinal canal to only 7 mm.  Clinical correlation for signs and symptoms of spinal cord  compression is recommended. This could be better evaluated with a  non-emergent MRI of the thoracic spine if clinically indicated.  Original Report Authenticated By: Vinnie Langton, M.D.    Impression:  Because the patient has significant cellulitis involving his right forearm I think we should postpone surgery for at least a week or 2. In addition, CT angiogram of the chest demonstrates fairly severe spinal stenosis at the T5-T6 level. The patient does not seem to be symptomatic, but given the severity of stenosis it probably makes sense to ask for a formal consultation from our neurosurgical colleagues..  Plan:  Patient will complete his course of oral antibiotics as prescribed for his right forearm. We will obtain neurosurgical consult and see him back in followup in 2 weeks. If he is doing well and there are no contraindications to proceeding with recent surgery at that  time. He has been advised to resume taking Coumadin. Because he is now taking amiodarone his dose may need to be adjusted down.   Valentina Gu. Roxy Manns, MD 02/20/2013 6:16 PM

## 2013-02-20 NOTE — Patient Instructions (Signed)
Restart coumadin and call Ysidro Evert tomorrow to ask about adjusting your dose now that you are also taking amiodarone.

## 2013-02-20 NOTE — Progress Notes (Signed)
Pre-op Cardiac Surgery  Carotid Findings:  There is no evidence of hemodynamically significant internal carotid artery stenosis. Vertebral arteries are patent with antegrade flow.   Upper Extremity Right Left  Brachial Pressures 112-Triphasic 116-Triphasic  Radial Waveforms Triphasic Triphasic  Ulnar Waveforms Triphasic Triphasic  Palmar Arch (Allen's Test) Signal is unaffected with radial compression, obliterates with ulnar compression. Within normal limits.     02/20/2013 2:48 PM Maudry Mayhew, RDMS, RDCS

## 2013-02-20 NOTE — Pre-Procedure Instructions (Addendum)
[name]  02/20/2013   Your procedure is scheduled [id number], April 3,2014   Report to [facility] at 5:30 AM.  Call this number if you have problems the morning of surgery: [phone]   Remember:   Do not eat food or drink liquids after midnight.    Take these medicines the morning of surgery with A SIP OF WATER:  amiodarone (PACERONE) 200 MG tablet,  cetirizine (ZYRTEC) 10 MG tablet  metoprolol tartrate (LOPRESSOR) 25 MG tablet        Do not wear jewelry, make-up or nail polish.  Do not wear lotions, powders, or perfumes. You may wear deodorant.             Men may shave face and neck.  Do not bring valuables to the hospital.  Contacts, dentures or bridgework may not be worn into surgery.  Leave suitcase in the car. After surgery it may be brought to your room.  For patients admitted to the hospital, checkout time is 11:00 AM the day of  discharge.   Patients discharged the day of surgery will not be allowed to drive home.  Name and phone number of your driver:  Special Instructions: Shower using CHG 2 nights before surgery and the night before surgery.  If you shower the day of surgery use CHG.  Use special wash - you have one bottle of CHG for all showers.  You should use approximately 1/3 of the bottle for each shower.   Please read over the following fact sheets that you were given: Pain Booklet, Coughing and Deep Breathing, Blood Transfusion Information and Surgical Site Infection Prevention

## 2013-02-22 ENCOUNTER — Telehealth: Payer: Self-pay | Admitting: Thoracic Surgery (Cardiothoracic Vascular Surgery)

## 2013-02-22 NOTE — Telephone Encounter (Signed)
I spoke with Dr Sherwood Gambler from Neurosurgery regarding the findings of spinal stenosis noted on [name] recent chest CT scan and discussed Dr Donnella Bi recommendations over the telephone with [name].  Given the absence of any history of symptoms potentially related to spinal stenosis there is no indication to consider any type of surgical intervention at this time.  Theoretically the patient could be at slightly increased risk of spinal cord injury, particularly if he were to have any prolonged periods of hypotension.  We will plan to reschedule mitral valve repair once his forearm has healed sufficiently.  [name] H 02/22/2013 3:07 PM

## 2013-02-23 ENCOUNTER — Inpatient Hospital Stay (HOSPITAL_COMMUNITY)
Admission: RE | Admit: 2013-02-23 | Payer: Medicare Other | Source: Ambulatory Visit | Admitting: Thoracic Surgery (Cardiothoracic Vascular Surgery)

## 2013-02-23 ENCOUNTER — Encounter (HOSPITAL_COMMUNITY): Admission: RE | Payer: Self-pay | Source: Ambulatory Visit

## 2013-02-23 SURGERY — REPAIR, MITRAL VALVE, MINIMALLY INVASIVE
Anesthesia: General | Site: Chest | Laterality: Right

## 2013-02-24 ENCOUNTER — Other Ambulatory Visit: Payer: Self-pay | Admitting: *Deleted

## 2013-03-03 ENCOUNTER — Ambulatory Visit
Admission: RE | Admit: 2013-03-03 | Discharge: 2013-03-03 | Disposition: A | Discharge status: Home / Self Care | Payer: Medicare Other | Source: Ambulatory Visit | Attending: Thoracic Surgery (Cardiothoracic Vascular Surgery) | Admitting: Thoracic Surgery (Cardiothoracic Vascular Surgery)

## 2013-03-03 ENCOUNTER — Encounter: Payer: Self-pay | Admitting: Thoracic Surgery (Cardiothoracic Vascular Surgery)

## 2013-03-03 ENCOUNTER — Other Ambulatory Visit: Payer: Self-pay

## 2013-03-03 ENCOUNTER — Ambulatory Visit (INDEPENDENT_AMBULATORY_CARE_PROVIDER_SITE_OTHER): Payer: Medicare Other | Admitting: Thoracic Surgery (Cardiothoracic Vascular Surgery)

## 2013-03-03 VITALS — BP 129/79 | HR 82 | Resp 16 | Ht 70.0 in | Wt 168.0 lb

## 2013-03-03 DIAGNOSIS — I4891 Unspecified atrial fibrillation: Secondary | ICD-10-CM

## 2013-03-03 DIAGNOSIS — I340 Nonrheumatic mitral (valve) insufficiency: Secondary | ICD-10-CM | POA: Insufficient documentation

## 2013-03-03 DIAGNOSIS — I059 Rheumatic mitral valve disease, unspecified: Secondary | ICD-10-CM

## 2013-03-03 DIAGNOSIS — M4804 Spinal stenosis, thoracic region: Secondary | ICD-10-CM | POA: Insufficient documentation

## 2013-03-03 DIAGNOSIS — I714 Abdominal aortic aneurysm, without rupture, unspecified: Secondary | ICD-10-CM

## 2013-03-03 DIAGNOSIS — I480 Paroxysmal atrial fibrillation: Secondary | ICD-10-CM | POA: Insufficient documentation

## 2013-03-03 HISTORY — DX: Abdominal aortic aneurysm, without rupture: I71.4

## 2013-03-03 HISTORY — DX: Abdominal aortic aneurysm, without rupture, unspecified: I71.40

## 2013-03-03 HISTORY — DX: Spinal stenosis, thoracic region: M48.04

## 2013-03-03 MED ORDER — IOHEXOL 350 MG/ML SOLN
75.0000 mL | Freq: Once | INTRAVENOUS | Status: AC | PRN
  Administered 2013-03-03: 75 mL via INTRAVENOUS

## 2013-03-03 NOTE — Patient Instructions (Signed)
Stop taking coumadin (warfarin) on Friday 4/18  Continue all other medications through Tuesday 4/22  Nothing to eat or drink after midnight the night before surgery  On the morning of surgery take only your metoprolol (Lopressor) with a sip of water and do not take any other medications

## 2013-03-03 NOTE — Progress Notes (Signed)
[address]            [place] [address]          [phone]     CARDIOTHORACIC SURGERY OFFICE NOTE  Referring Provider is Sueanne Margarita, MD PCP is Irven Shelling, MD   HPI:  Patient returns for followup of severe symptomatic mitral regurgitation and atrial fibrillation.  He was originally seen in consultation on 02/10/2013 and previously he had been scheduled for minimally invasive mitral valve repair and Maze procedure last week. However, when he returned for followup on 02/20/2013 he had just recently suffered fairly extensive lacerations on his right forearm while he was working in his garden at home. He was noted to have fairly severe associated cellulitis for which he had been prescribed oral clindamycin. We decided to postpone his surgery and he now returns with hope to reschedule in the near future. At the time of his last visit we also noted that recently performed CT angiogram of the chest demonstrated severe spinal stenosis at the T5-6 interspace.  I discussed this finding over the telephone with Dr. Sherwood Gambler who reviewed the patient's CT scan as well as previous CT scans. He felt that in the absence of any associated symptoms are clearly was no reason to consider any sort of interventional procedure from a neurosurgical standpoint.  He recommended outpatient consultation in the future with Dr. Trenton Gammon if the patient were to develop any sort of symptoms potentially related to his upper back or spinal stenosis.  The patient states that since his last visit his arm has healed up nicely. All the sutures have been removed and the cellulitis has resolved.  He otherwise feels well. He states that he is scheduled to see his dermatologist later this week because of a diffuse rash that he has had for many months. This rashe is not itchy and not associated with any low-grade fever or other symptoms. The rashes been quite stable for many months and started  long before he was given his prescription for amiodarone. He denies any fevers chills or productive cough. He has no significant shortness of breath. His lower extremity edema has improved on oral Lasix therapy. He has not had any problems managing Coumadin. The remainder of his review of systems is unchanged from previously.  His insurance company finally approved a CT angiogram of the abdomen and pelvis.   Current Outpatient Prescriptions  Medication Sig Dispense Refill  . amiodarone (PACERONE) 200 MG tablet Take 1 tablet (200 mg total) by mouth 2 (two) times daily. Begin 7 days prior to surgery.  60 tablet  0  . cetirizine (ZYRTEC) 10 MG tablet Take 10 mg by mouth as needed for allergies.      . cholecalciferol (VITAMIN D) 1000 UNITS tablet Take 2,000 Units by mouth every morning.       . clindamycin (CLEOCIN) 150 MG capsule Take 150 mg by mouth 4 (four) times daily.       . Colesevelam HCl (WELCHOL) 3.75 G PACK Take 1 packet by mouth every evening.      . furosemide (LASIX) 20 MG tablet Take 2 tablets (40 mg total) by mouth daily.  60 tablet  11  . Glucosamine-Chondroit-Vit C-Mn (GLUCOSAMINE CHONDR 500 COMPLEX) CAPS Take 2 capsules by mouth daily.       . metoprolol tartrate (LOPRESSOR) 25 MG tablet Take 12.5 mg by mouth  2 (two) times daily.       Marland Kitchen warfarin (COUMADIN) 5 MG tablet Take 5 mg by mouth daily. HOLDING PRIOR TO SURGERY ON 02/23/13       No current facility-administered medications for this visit.      Physical Exam:   BP 129/79  Pulse 82  Resp 16  Ht 5\' 10"  (1.778 m)  Wt 168 lb (76.204 kg)  BMI 24.11 kg/m2  SpO2 95%  General:  Well-appearing  Chest:   Clear to auscultation with symmetrical breath sounds  CV:   Irregular rate and rhythm with prominent systolic murmur  Incisions:  n/a  Abdomen:  Soft and nontender  Extremities:  Right forearm has healed completely and there is no remaining cellulitis, there remains mild bilateral lower extremity edema  Diagnostic  Tests:   CT ANGIOGRAPHY ABDOMEN AND PELVIS  Technique: Multidetector CT imaging of the abdomen and pelvis was  performed using the standard protocol during bolus administration  of intravenous contrast. Multiplanar reconstructed images  including MIPs were obtained and reviewed to evaluate the vascular  anatomy.  Contrast: 55mL OMNIPAQUE IOHEXOL 350 MG/ML SOLN  Comparison: None.  Findings: Moderate dependent pleural effusions bilaterally.  Minimal associated basilar atelectasis. Mild cardiac enlargement.  No pericardial effusion. Lower thoracic aorta visualized  demonstrates atherosclerosis but no dissection or occlusive  process.  Abdomen: Atherosclerotic changes present of the abdominal aorta  most pronounced in the infrarenal segment. Infrarenal abdominal  aortic small focal saccular aneurysm measures 24 x 27 mm with  eccentric mural thrombus on the left side. Aortic lumen remains  patent. No significant aortoiliac occlusive process. Celiac, SMA,  main renal arteries, and IMA all remain patent. Negative for renal  or mesenteric vascular occlusive process. No significant aneurysm,  dissection, luminal narrowing, stenosis, or retroperitoneal  hemorrhage. Aortic bifurcation is patent. Atherosclerotic changes  noted of the tortuous iliac system. The common, internal and  external iliac arteries all remain patent. No iliac occlusive  process, dissection, significant stenosis, or aneurysm. No pelvic  hemorrhage. The visualized common femoral, profunda femoral,  proximal superficial femoral arteries are also all patent in the  inguinal regions.  Nonvascular findings: Liver, gallbladder, biliary system,  pancreas, spleen, and adrenal glands are within normal limits for  arterial phase imaging. Kidneys demonstrate cortical thinning  bilaterally with a lower pole exophytic right renal cyst measuring  6.3 cm, image 75. Negative for renal obstruction or  hydronephrosis.  Negative for  bowel obstruction, dilatation, ileus, or free air.  No abdominal free fluid, fluid collection, hemorrhage, abscess, or  adenopathy.  Postop changes in the lower abdomen anteriorly.  No pelvic free fluid, fluid collection, hemorrhage, adenopathy,  inguinal abnormality, or hernia. Prostate gland is enlarged  measuring 6.0 x 6.5 cm, image 148.  Bones appear osteopenic. Degenerative changes of the spine, pelvis  and hips. No compression fracture.  Review of the MIP images confirms the above findings.  IMPRESSION:  Diffuse aorto iliac and visceral atherosclerotic changes.  Negative for aortoiliac occlusive process.  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  Small dependent pleural effusions with basilar atelectasis  6.3 cm right inferior renal cyst  Enlarged prostate gland  Original Report Authenticated By: Jerilynn Mages. Annamaria Boots, M.D.   Impression:  The patient has mitral valve prolapse with flail segment of the posterior leaflet and severe symptomatic mitral regurgitation. The patient presents with persistent atrial fibrillation and a 4-6 month history of symptoms of exertional shortness of breath, fatigue,  orthopnea, and bilateral lower extremity edema. Left ventricular systolic function appears preserved. The patient does not have significant coronary artery disease and pulmonary artery pressures were only moderately elevated at the time of catheterization. Based upon review of the patient's transthoracic and transesophageal echocardiograms I feel there is a high likelihood that his valve should be repairable. Risks associated with surgery will be slightly elevated due to the patient's advanced age and what sounds to be very mild cognitive impairment with some memory loss and occasional episodes of confusion or disorientation. The patient appears to be a relatively good candidate for minimally invasive approach for surgery, although based upon findings  of his CT angiogram of the abdomen and pelvis I think it might be best to avoid cannulation of the femoral artery for surgery.  The patient coincidentally has been found to have severe spinal stenosis at the T5-6 level.  He denies any symptoms of back pain or other myelopathic symptoms potentially related to spinal stenosis.   Plan:  I again reviewed the indications, risks, and potential benefits of surgery with the patient and his daughter in the office today. The rationale for elective mitral valve repair surgery has been explained, including a comparison between surgery and continued medical therapy with close follow-up.  The likelihood of successful and durable valve repair has been discussed with particular reference to the findings of their recent echocardiogram.  Based upon these findings and previous experience, I have quoted them a greater than 90 percent likelihood of successful valve repair.  In the unlikely event that their valve cannot be successfully repaired, we discussed the possibility of replacing the mitral valve using a mechanical prosthesis with the attendant need for long-term anticoagulation versus the alternative of replacing it using a bioprosthetic tissue valve with its potential for late structural valve deterioration and failure, depending upon the patient's longevity.  The patient specifically requests that if the mitral valve must be replaced that it be done using a bioprosthetic tissue valve.  We reviewed the results of this CT scans including the findings of spinal stenosis as well as small thrombosed secular aneurysm of the infrarenal abdominal aorta with significant aortoiliac atherosclerotic disease. We discussed alternative surgical approaches for treatment of his mitral valve disease and atrial fibrillation including conventional sternotomy versus mini thoracotomy approach using right axillary artery cannulation. The rationale for avoiding femoral cannulation was discussed.   We plan to proceed with minimally invasive mitral valve repair and Maze procedure on Wednesday, 03/15/2013 with right axillary arterial cannulation.  All questions have been addressed.  The patient has been instructed to stop taking Coumadin on Friday, April 18 in anticipation of surgery. He will continue all other medications through the night before surgery. On the morning of surgery he will take only metoprolol with a sip of water.    Valentina Gu. Roxy Manns, MD 03/03/2013 12:42 PM

## 2013-03-06 ENCOUNTER — Encounter (HOSPITAL_COMMUNITY): Payer: Self-pay | Admitting: Respiratory Therapy

## 2013-03-12 NOTE — H&P (Signed)
CARDIOTHORACIC SURGERY HISTORY AND PHYSICAL EXAM  PCP is Irven Shelling, MD Referring Provider is Sueanne Margarita, MD     Chief Complaint   Patient presents with   .  Mitral Valve Prolapse       surgical eval for mitral valve repair, Cardiac Cath 02/03/13, TEE 01/31/13     HPI:  Patient is an 79 year old married white male from [place] who has no previous cardiac history and has remained fairly active and relatively healthy most of his life. States that over the past 4-6 months he has developed exertional shortness of breath, decreased energy, and increased lower extremity swelling. He states that he first developed swelling in his legs last summer after he had knee surgery. However he developed swelling in both legs, not just the left side which was operated on.  Since then he gradually developed exertional shortness of breath and fatigue. He states that occasionally gets short of breath when he tries to lie flat in bed. He has not had any chest pain or chest tightness either with activity or at rest. He denies resting shortness of breath other than when he lays flat in bed. He has not had dizzy spells nor palpitations.  He went to see his primary care physician and was found to be and persistent atrial fibrillation. He was referred to Dr. Radford Pax for cardiology consultation. An echocardiogram was performed demonstrating mitral valve prolapse with severe mitral regurgitation. Left ventricular size and systolic function appeared normal with ejection fraction 71%. There was mild left atrial enlargement. The patient subsequently underwent transesophageal echocardiogram which confirmed the presence of mitral valve prolapse with a flail segment of the posterior leaflet and severe mitral regurgitation. Cardiac catheterization was performed and notable for the absence of significant coronary artery disease. The patient was referred for possible surgical intervention.  He was originally seen  in consultation on 02/10/2013 and previously he had been scheduled for minimally invasive mitral valve repair and Maze procedure last week. However, when he returned for followup on 02/20/2013 he had just recently suffered fairly extensive lacerations on his right forearm while he was working in his garden at home. He was noted to have fairly severe associated cellulitis for which he had been prescribed oral clindamycin. We decided to postpone his surgery and he now returns with hope to reschedule in the near future. At the time of his last visit we also noted that recently performed CT angiogram of the chest demonstrated severe spinal stenosis at the T5-6 interspace.  I discussed this finding over the telephone with Dr. Sherwood Gambler who reviewed the patient's CT scan as well as previous CT scans. He felt that in the absence of any associated symptoms are clearly was no reason to consider any sort of interventional procedure from a neurosurgical standpoint.  He recommended outpatient consultation in the future with Dr. Trenton Gammon if the patient were to develop any sort of symptoms potentially related to his upper back or spinal stenosis.  The patient states that since his last visit his arm has healed up nicely. All the sutures have been removed and the cellulitis has resolved.  He otherwise feels well. He states that he is scheduled to see his dermatologist later this week because of a diffuse rash that he has had for many months. This rashe is not itchy and not associated with any low-grade fever or other symptoms. The rashes been quite stable for many months and started long before he  was given his prescription for amiodarone. He denies any fevers chills or productive cough. He has no significant shortness of breath. His lower extremity edema has improved on oral Lasix therapy. He has not had any problems managing Coumadin. The remainder of his review of systems is unchanged from previously.  His insurance company  finally approved a CT angiogram of the abdomen and pelvis.   Past Medical History  Diagnosis Date  . Heart palpitations     PT STATES NOT REALLY A BIG PROBLEM--DENIES CHEST PAINS  . Insomnia   . BPH (benign prostatic hypertrophy)   . Constipation   . History of torn meniscus of left knee     PAINFUL LEFT KNEE  . Hearing loss     BILATERAL - WEARS HEARING AIDS  . Hyperlipidemia   . Mitral valve prolapse   . Mitral valve regurgitation   . Sleep apnea     USES CPAP EVERY NIGHT  . OSA on CPAP   . Erectile dysfunction   . CHF (congestive heart failure)   . Dysrhythmia     a fib  . Cancer     SKIN CANCERS  . Arthritis     HANDS  . Hyperlipidemia   . Atrial fibrillation 02/10/2013  . 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 AAA (abdominal aortic aneurysm) without rupture 03/03/2013    Small, partially thrombosed saccular aneurysm    Past Surgical History  Procedure Laterality Date  . Skin cancers removed from left forehead and left nose    . Skin cancers removed from both legs    . Surgery left neck for removal of metastatic lymph node ( from cancer left forehead)  and left neck dissection-rest of lymph nodes negative for any cancer    . Carpal tunnel release      BILATERAL  . Right rotator cuff repair    . Tee without cardioversion N/A 01/31/2013    Procedure: TRANSESOPHAGEAL ECHOCARDIOGRAM (TEE);  Surgeon: Sueanne Margarita, MD;  Location: [facility] ENDOSCOPY;  Service: Cardiovascular;  Laterality: N/A;  . Hemorrhoid surgery      1973    Family History  Problem Relation Age of Onset  . Coronary artery disease Father   . Heart disease Father   . Diabetes Mother   . Arthritis Mother   . Congestive Heart Failure Mother   . Alzheimer's disease Mother     Social History History  Substance Use Topics  . Smoking status: Former Smoker -- 1.00 packs/day for 20 years    Start date: 11/23/1960  . Smokeless tobacco: Not on file     Comment: [place]  . Alcohol Use:  3.5 oz/week    7 drink(s) per week     Comment: COCKTAIL AND GLASS OF WINE WITH DINNER -DAILY    Prior to Admission medications   Medication Sig Start Date End Date Taking? Authorizing Provider  amiodarone (PACERONE) 200 MG tablet Take 1 tablet (200 mg total) by mouth 2 (two) times daily. Begin 7 days prior to surgery. 02/20/13  Yes Rexene Alberts, MD  cetirizine (ZYRTEC) 10 MG tablet Take 10 mg by mouth as needed for allergies.   Yes Historical Provider, MD  cholecalciferol (VITAMIN D) 1000 UNITS tablet Take 2,000 Units by mouth every morning.    Yes Historical Provider, MD  Colesevelam HCl [facility]) 3.75 G PACK Take 1 packet by mouth every evening.   Yes Historical Provider, MD  furosemide (LASIX) 20 MG tablet Take 2  tablets (40 mg total) by mouth daily. 01/24/13  Yes Sueanne Margarita, MD  Glucosamine-Chondroit-Vit C-Mn (GLUCOSAMINE CHONDR 500 COMPLEX) CAPS Take 2 capsules by mouth daily.    Yes Historical Provider, MD  metoprolol tartrate (LOPRESSOR) 25 MG tablet Take 12.5 mg by mouth 2 (two) times daily.    Yes Historical Provider, MD  warfarin (COUMADIN) 5 MG tablet Take 2.5 mg by mouth daily. HOLDING PRIOR TO SURGERY ON 02/23/13 01/24/13  Yes Sueanne Margarita, MD    Allergies  Allergen Reactions  . Statins     Muscle pains  . Latex Rash    bandaids--no other latex problems    Review of Systems:              General:                      normal appetite, decreased energy, no weight gain, no weight loss, no fever             Cardiac:                      no chest pain with exertion, no chest pain at rest, +SOB with exertion, no resting SOB, no PND, + orthopnea, no palpitations, + arrhythmia, + atrial fibrillation, + LE edema, no dizzy spells, no syncope             Respiratory:                + shortness of breath, no home oxygen, no productive cough, no dry cough, no bronchitis, no wheezing, no hemoptysis, no asthma, no pain with inspiration or cough, + sleep apnea, + CPAP at night              GI:                                no difficulty swallowing, no reflux, no frequent heartburn, no hiatal hernia, no abdominal pain, + constipation, no diarrhea, no hematochezia, no hematemesis, no melena             GU:                              no dysuria,  + frequency, no urinary tract infection, no hematuria, + enlarged prostate, no kidney stones, no kidney disease             Vascular:                     no pain suggestive of claudication, no pain in feet, no leg cramps, no varicose veins, no DVT, no non-healing foot ulcer             Neuro:                         no stroke, no TIA's, no seizures, no headaches, no temporary blindness one eye,  no slurred speech, no peripheral neuropathy, + mild chronic pain in left knee, mild instability of gait, + memory/cognitive dysfunction - somewhat forgetful at times and family has noticed some episodes of confusion, disorientation             Musculoskeletal:         + arthritis, + joint swelling, no myalgias, mild difficulty walking, mildly limited mobility  Skin:                            no rash, no itching, no skin infections, no pressure sores or ulcerations             Psych:                         no anxiety, no depression, no nervousness, + unusual recent stress due to wife battling stage IV lung cancer             Eyes:                           no blurry vision, no floaters, no recent vision changes, + wears glasses or contacts             ENT:                            + hearing loss, no loose or painful teeth, no dentures, last saw dentist October 2013             Hematologic:               + easy bruising, no abnormal bleeding, no clotting disorder, no frequent epistaxis             Endocrine:                   no diabetes, does not check CBG's at home                           Physical Exam:              BP 129/84  Pulse 100  Resp 20  Ht 5\' 10"  (1.778 m)  Wt 168 lb (76.204 kg)  BMI 24.11 kg/m2  SpO2 93%              General:                      Thin,  well-appearing             HEENT:                       Unremarkable               Neck:                           no JVD, no bruits, no adenopathy               Chest:                         clear to auscultation, symmetrical breath sounds, no wheezes, no rhonchi               CV:                              Irregular rate and rhythm, grade IV/VI blowing holosystolic murmur               Abdomen:  soft, non-tender, no masses               Extremities:                 warm, well-perfused, pulses diminished at ankles, good left femoral pulse, mildly diminished right femoral pulse, 2+ bilateral LE edema             Rectal/GU                   Deferred             Neuro:                         Grossly non-focal and symmetrical throughout             Skin:                            Clean and dry, no rashes, few scrapes both legs, thin delicate skin       Diagnostic Tests:  Transthoracic Echocardiogram  Transthoracic echocardiogram performed 01/16/2013 at [facility] cardiology is reviewed. There is mitral valve prolapse with what appears to be a flail segment of the posterior leaflet of the mitral valve and severe mitral regurgitation. The jet of regurgitation is eccentric and directed anteriorly. There is mild tricuspid regurgitation. Left ventricular size and systolic function appears normal. Ejection fraction was calculated 72%. Pulmonary artery pressures were estimated to be elevated. There is mild left atrial enlargement.   Transesophageal Echocardiogram Procedure: Transesophageal echocardiogram    Operator: Fransico Him, MD   Indications: Severe Mitral Regurgitation   Complications:   IV Meds: Versed 3mg , Fentanyl 13mcg IV   Results:   Normal LV size and function   Normal RV size and function   Normal TV with mild to moderate MR   Normal PV with mild PR   Trileaflet AV with moderate AV sclerosis and no stenosis   Myxomatous  posterior MV leaflet with severe holosystolic MVP and severe mitral regurgitation directed eccentrically towards the anterior wall and wraps around the entire LA with flow into the pulmonary veins. There is evidence of intermittent flow reversal in the pulmonary vein c/w severe MR.   Moderately enlarged LA with spontaneous echo contrast.   Normal RA and normal interatrial septum with no evidence of PFO.   Mild atherosclerosis of the thoracic aorta.   The patient tolerated the procedure well and was transferred back to his room in stable condition.   No complications noted.   IMPRESSION:   Severe MV prolapse of the posterior MV leaflet with severe MR   PLAN:   CVST with Dr. Ricard Dillon for MV repair   Continue rate control for afib   Continue Coumadin            Cardiac Cath Procedure Note   Indication:   Procedures performed:  1) Right heart cathererization   2) Selective coronary angiography   3) Left heart catheterization   4) Left ventriculogram   Description of procedure:   The risks and indication of the procedure were explained. Consent was signed and placed on the chart. An appropriate timeout was taken prior to the procedure. The right groin was prepped and draped in the routine sterile fashion and anesthetized with 1% local lidocaine.   A 5 FR arterial sheath was placed in the right femoral artery using a modified Seldinger technique. Standard catheters including a  JL4, JR4 and angled pigtail were used. All catheter exchanges were made over a wire. A 7 FR venous sheath was placed in the right femoral vein using a modified Seldinger technique. A standard Swan-Ganz catheter was used for the procedure.   Complications: None apparent   Findings:  RA = 12/89mmHg with mean 23mmHg   RV = 42/32mmHg with mean 69mmHg   PA = 42/61mmHg with a mean of 82mmHg   PCW =42/54mmHg with a mean of 50mmHg and large V waves   Fick cardiac output/index =3.5/1.8   FA sat =95%   PA sat =57%   RA sat- 54%     Ao Pressure:99/23mmHg   LV Pressure:90/20mmHg   There was no signficant gradient across the aortic valve on pullback.   Left main: widely patent and bifurcates into an LAD and left circumflex arteries.  LAD: Widely patent throughout its course and gives rise to a moderate first diagonal which is patent and then a small second diagonal which is patent.   [id number] patent throughout its course. It gives rise to a large OM 1 which has a 30-40% stenosis in the ostium. It bifurcates into 2 daughter branches which are widely patent. This is a left dominant system.   [id number] patent, small and nondomiant   LV-gram done in the RAO projection: not done due to elevated PCW pressure   Assessment:   1. Nonobstructive ASCAD with a30-40% stenosis in the ostium of the OM1 otherwise normal coronary arteries.   2. Normal LVF by echo   3. Severe posterior MVP with severe eccentric MR by echo   4. Mild pulmonary HTN by right heart cath.   5. Atrial fibrilation - patient now back in afib   Plan/Discussion:   1. D/C home once IVF and bedrest complete   2. Outpatient TEE will be set up   3. Start coumadin due to recurrent afib   4. CVTS consult with Dr. Otilio Jefferson, MD   11:41 AM  *RADIOLOGY REPORT*   Clinical Data: Preoperative evaluation for mitral regurgitation.   CT ANGIOGRAPHY CHEST   Technique: Multidetector CT imaging of the chest using the   standard protocol during bolus administration of intravenous   contrast. Multiplanar reconstructed images including MIPs were   obtained and reviewed to evaluate the vascular anatomy.   Contrast: 76mL OMNIPAQUE IOHEXOL 300 MG/ML SOLN   Comparison: Chest CT 03/29/2006.   Findings:   Mediastinum: Heart size is mildly enlarged with biatrial dilatation   and right ventricular dilatation. There is no significant   pericardial fluid, thickening or pericardial calcification. There   is atherosclerosis of the thoracic aorta, the great vessels of the    mediastinum and the coronary arteries, including calcified   atherosclerotic plaque in the left main, left anterior descending   and right coronary arteries. Numerous reactive size mediastinal and   hilar lymph nodes. No definite pathologic nodal enlargement.   Esophagus is unremarkable in appearance.   Lungs/Pleura: Moderate right and small left-sided pleural effusions   are simple in appearance and layer posteriorly. This is associated   with minimal passive atelectasis in the lower lobes of the lungs   bilaterally. Some fluid is also seen tracking in the minor   fissure. No acute consolidative airspace disease. No suspicious   appearing pulmonary nodules or masses.   Upper Abdomen: Multifocal parenchymal thinning in the kidneys   bilaterally. Atherosclerosis.   Musculoskeletal: There are no aggressive appearing lytic or blastic  lesions noted in the visualized portions of the skeleton.   Multilevel degenerative disc disease, most pronounced at T5-T6   where there is a large disc osteophyte complex posteriorly which   significantly impinges upon the central spinal canal resulting in   an AP diameter of only 7 mm.   IMPRESSION:   1. Cardiomegaly with right ventricular and biatrial dilatation.   2. Moderate right and small left-sided pleural effusions are   simple in appearance of layering posteriorly.   3. Atherosclerosis, including left main and two-vessel coronary   artery disease. assessment for potential risk factor modification,   dietary therapy or pharmacologic therapy may be warranted, if   clinically indicated.   4. Large disc/osteophyte complex extending posterior to T5-T6   resulting in narrowing of the central spinal canal to only 7 mm.   Clinical correlation for signs and symptoms of spinal cord   compression is recommended. This could be better evaluated with a   non-emergent MRI of the thoracic spine if clinically indicated.   Original Report Authenticated By:  Vinnie Langton, M.D.   CT ANGIOGRAPHY ABDOMEN AND PELVIS  Technique: Multidetector CT imaging of the abdomen and pelvis was   performed using the standard protocol during bolus administration   of intravenous contrast. Multiplanar reconstructed images   including MIPs were obtained and reviewed to evaluate the vascular   anatomy.   Contrast: 70mL OMNIPAQUE IOHEXOL 350 MG/ML SOLN   Comparison: None.   Findings: Moderate dependent pleural effusions bilaterally.   Minimal associated basilar atelectasis. Mild cardiac enlargement.   No pericardial effusion. Lower thoracic aorta visualized   demonstrates atherosclerosis but no dissection or occlusive   process.   Abdomen: Atherosclerotic changes present of the abdominal aorta   most pronounced in the infrarenal segment. Infrarenal abdominal   aortic small focal saccular aneurysm measures 24 x 27 mm with   eccentric mural thrombus on the left side. Aortic lumen remains   patent. No significant aortoiliac occlusive process. Celiac, SMA,   main renal arteries, and IMA all remain patent. Negative for renal   or mesenteric vascular occlusive process. No significant aneurysm,   dissection, luminal narrowing, stenosis, or retroperitoneal   hemorrhage. Aortic bifurcation is patent. Atherosclerotic changes   noted of the tortuous iliac system. The common, internal and   external iliac arteries all remain patent. No iliac occlusive   process, dissection, significant stenosis, or aneurysm. No pelvic   hemorrhage. The visualized common femoral, profunda femoral,   proximal superficial femoral arteries are also all patent in the   inguinal regions.   Nonvascular findings: Liver, gallbladder, biliary system,   pancreas, spleen, and adrenal glands are within normal limits for   arterial phase imaging. Kidneys demonstrate cortical thinning   bilaterally with a lower pole exophytic right renal cyst measuring   6.3 cm, image 75. Negative for renal  obstruction or   hydronephrosis.   Negative for bowel obstruction, dilatation, ileus, or free air.   No abdominal free fluid, fluid collection, hemorrhage, abscess, or   adenopathy.   Postop changes in the lower abdomen anteriorly.   No pelvic free fluid, fluid collection, hemorrhage, adenopathy,   inguinal abnormality, or hernia. Prostate gland is enlarged   measuring 6.0 x 6.5 cm, image 148.   Bones appear osteopenic. Degenerative changes of the spine, pelvis   and hips. No compression fracture.   Review of the MIP images confirms the above findings.   IMPRESSION:   Diffuse aorto iliac and visceral atherosclerotic  changes.   Negative for aortoiliac occlusive process.   Small infrarenal abdominal aortic thrombosed saccular aneurysm,   maximal diameter 2.4 x 2.7 cm.   Small dependent pleural effusions with basilar atelectasis   6.3 cm right inferior renal cyst   Enlarged prostate gland   Original Report Authenticated By: Jerilynn Mages. Annamaria Boots, M.D.  Impression:  The patient has mitral valve prolapse with flail segment of the posterior leaflet and severe symptomatic mitral regurgitation. The patient presents with persistent atrial fibrillation and a 4-6 month history of symptoms of exertional shortness of breath, fatigue, orthopnea, and bilateral lower extremity edema. Left ventricular systolic function appears preserved. The patient does not have significant coronary artery disease and pulmonary artery pressures were only moderately elevated at the time of catheterization. Based upon review of the patient's transthoracic and transesophageal echocardiograms I feel there is a high likelihood that his valve should be repairable. Risks associated with surgery will be slightly elevated due to the patient's advanced age and what sounds to be very mild cognitive impairment with some memory loss and occasional episodes of confusion or disorientation. The patient appears to be a relatively good candidate for  minimally invasive approach for surgery, although based upon findings of his CT angiogram of the abdomen and pelvis I think it might be best to avoid cannulation of the femoral artery for surgery.  The patient coincidentally has been found to have severe spinal stenosis at the T5-6 level.  He denies any symptoms of back pain or other myelopathic symptoms potentially related to spinal stenosis.   Plan:  I again reviewed the indications, risks, and potential benefits of surgery with the patient and his daughter in the office today. The rationale for elective mitral valve repair surgery has been explained, including a comparison between surgery and continued medical therapy with close follow-up.  The likelihood of successful and durable valve repair has been discussed with particular reference to the findings of their recent echocardiogram.  Based upon these findings and previous experience, I have quoted them a greater than 90 percent likelihood of successful valve repair.  In the unlikely event that their valve cannot be successfully repaired, we discussed the possibility of replacing the mitral valve using a mechanical prosthesis with the attendant need for long-term anticoagulation versus the alternative of replacing it using a bioprosthetic tissue valve with its potential for late structural valve deterioration and failure, depending upon the patient's longevity.  The patient specifically requests that if the mitral valve must be replaced that it be done using a bioprosthetic tissue valve.  We reviewed the results of this CT scans including the findings of spinal stenosis as well as small thrombosed secular aneurysm of the infrarenal abdominal aorta with significant aortoiliac atherosclerotic disease. We discussed alternative surgical approaches for treatment of his mitral valve disease and atrial fibrillation including conventional sternotomy versus mini thoracotomy approach using right axillary artery  cannulation. The rationale for avoiding femoral cannulation was discussed.  We plan to proceed with minimally invasive mitral valve repair and Maze procedure on Wednesday, 03/15/2013 with right axillary arterial cannulation.  All questions have been addressed.  The patient has been instructed to stop taking Coumadin on Friday, April 18 in anticipation of surgery. He will continue all other medications through the night before surgery. On the morning of surgery he will take only metoprolol with a sip of water.    Valentina Gu. Roxy Manns, MD 03/03/2013 12:42 PM

## 2013-03-13 ENCOUNTER — Encounter (HOSPITAL_COMMUNITY)
Admission: RE | Admit: 2013-03-13 | Discharge: 2013-03-13 | Disposition: A | Discharge status: Home / Self Care | Payer: Medicare Other | Source: Ambulatory Visit | Attending: Thoracic Surgery (Cardiothoracic Vascular Surgery) | Admitting: Thoracic Surgery (Cardiothoracic Vascular Surgery)

## 2013-03-13 ENCOUNTER — Encounter (HOSPITAL_COMMUNITY): Payer: Self-pay | Admitting: *Deleted

## 2013-03-13 ENCOUNTER — Encounter (HOSPITAL_COMMUNITY): Payer: Self-pay

## 2013-03-13 VITALS — BP 129/81 | HR 92 | Temp 97.7°F | Resp 20 | Ht 69.0 in | Wt 172.0 lb

## 2013-03-13 DIAGNOSIS — I059 Rheumatic mitral valve disease, unspecified: Secondary | ICD-10-CM

## 2013-03-13 DIAGNOSIS — I4891 Unspecified atrial fibrillation: Secondary | ICD-10-CM

## 2013-03-13 LAB — URINALYSIS, ROUTINE W REFLEX MICROSCOPIC
Bilirubin Urine: NEGATIVE
Ketones, ur: NEGATIVE mg/dL
Leukocytes, UA: NEGATIVE
Nitrite: NEGATIVE
Urobilinogen, UA: 0.2 mg/dL (ref 0.0–1.0)
pH: 5 (ref 5.0–8.0)

## 2013-03-13 LAB — BLOOD GAS, ARTERIAL
Acid-base deficit: 0.5 mmol/L (ref 0.0–2.0)
TCO2: 24.6 mmol/L (ref 0–100)
pCO2 arterial: 37.5 mmHg (ref 35.0–45.0)
pH, Arterial: 7.413 (ref 7.350–7.450)
pO2, Arterial: 77.3 mmHg — ABNORMAL LOW (ref 80.0–100.0)

## 2013-03-13 LAB — COMPREHENSIVE METABOLIC PANEL
AST: 30 U/L (ref 0–37)
Albumin: 3.8 g/dL (ref 3.5–5.2)
Alkaline Phosphatase: 60 U/L (ref 39–117)
BUN: 25 mg/dL — ABNORMAL HIGH (ref 6–23)
CO2: 26 mEq/L (ref 19–32)
Chloride: 100 mEq/L (ref 96–112)
GFR calc non Af Amer: 34 mL/min — ABNORMAL LOW (ref >90)
Potassium: 4 mEq/L (ref 3.5–5.1)
Total Bilirubin: 0.6 mg/dL (ref 0.3–1.2)

## 2013-03-13 LAB — CBC
HCT: 38.1 % — ABNORMAL LOW (ref 39.0–52.0)
MCV: 90.7 fL (ref 78.0–100.0)
Platelets: 152 10*3/uL (ref 150–400)
RBC: 4.2 MIL/uL — ABNORMAL LOW (ref 4.22–5.81)
RDW: 13.8 % (ref 11.5–15.5)
WBC: 6.5 10*3/uL (ref 4.0–10.5)

## 2013-03-13 MED ORDER — CHLORHEXIDINE GLUCONATE 4 % EX LIQD: 30.0000 mL | CUTANEOUS | Status: DC

## 2013-03-14 ENCOUNTER — Encounter (HOSPITAL_COMMUNITY): Payer: Self-pay

## 2013-03-14 MED ORDER — VANCOMYCIN HCL 10 G IV SOLR
1250.0000 mg | INTRAVENOUS | Status: AC
  Administered 2013-03-15: 1250 mg via INTRAVENOUS
  Filled 2013-03-14: qty 1250

## 2013-03-14 MED ORDER — PLASMA-LYTE 148 IV SOLN
INTRAVENOUS | Status: DC
  Filled 2013-03-14: qty 2.5

## 2013-03-14 MED ORDER — DEXTROSE 5 % IV SOLN
750.0000 mg | INTRAVENOUS | Status: DC
  Filled 2013-03-14: qty 750

## 2013-03-14 MED ORDER — POTASSIUM CHLORIDE 2 MEQ/ML IV SOLN
80.0000 meq | INTRAVENOUS | Status: DC
  Filled 2013-03-14: qty 40

## 2013-03-14 MED ORDER — SODIUM CHLORIDE 0.9 % IV SOLN
INTRAVENOUS | Status: AC
  Administered 2013-03-15: 1.1 [IU]/h via INTRAVENOUS
  Filled 2013-03-14: qty 1

## 2013-03-14 MED ORDER — DEXTROSE 5 % IV SOLN
1.5000 g | INTRAVENOUS | Status: AC
  Administered 2013-03-15: .75 g via INTRAVENOUS
  Administered 2013-03-15: 1.5 g via INTRAVENOUS
  Filled 2013-03-14 (×2): qty 1.5

## 2013-03-14 MED ORDER — VANCOMYCIN HCL 1000 MG IV SOLR
INTRAVENOUS | Status: AC
  Administered 2013-03-15: 11:00:00
  Filled 2013-03-14 (×2): qty 1000

## 2013-03-14 MED ORDER — EPINEPHRINE HCL 1 MG/ML IJ SOLN
0.5000 ug/min | INTRAVENOUS | Status: DC
  Filled 2013-03-14: qty 4

## 2013-03-14 MED ORDER — MAGNESIUM SULFATE 50 % IJ SOLN
40.0000 meq | INTRAMUSCULAR | Status: DC
  Filled 2013-03-14: qty 10

## 2013-03-14 MED ORDER — NITROGLYCERIN IN D5W 200-5 MCG/ML-% IV SOLN
2.0000 ug/min | INTRAVENOUS | Status: DC
  Filled 2013-03-14: qty 250

## 2013-03-14 MED ORDER — DEXMEDETOMIDINE HCL IN NACL 400 MCG/100ML IV SOLN
0.1000 ug/kg/h | INTRAVENOUS | Status: AC
  Administered 2013-03-15: 0.2 ug/kg/h via INTRAVENOUS
  Filled 2013-03-14: qty 100

## 2013-03-14 MED ORDER — SODIUM CHLORIDE 0.9 % IV SOLN
INTRAVENOUS | Status: AC
  Administered 2013-03-15 (×2): 14 mL/h via INTRAVENOUS
  Filled 2013-03-14: qty 40

## 2013-03-14 MED ORDER — DOPAMINE-DEXTROSE 3.2-5 MG/ML-% IV SOLN
2.0000 ug/kg/min | INTRAVENOUS | Status: AC
  Administered 2013-03-15: 3 ug/kg/min via INTRAVENOUS
  Filled 2013-03-14: qty 250

## 2013-03-14 MED ORDER — PHENYLEPHRINE HCL 10 MG/ML IJ SOLN
30.0000 ug/min | INTRAVENOUS | Status: AC
  Administered 2013-03-15: 25 ug/min via INTRAVENOUS
  Filled 2013-03-14: qty 2

## 2013-03-14 NOTE — Progress Notes (Signed)
Anesthesia chart review: Patient is an 79 year old male scheduled for minimally invasive mitral valve repair and MAZE procedure by Dr. Roxy Manns on 03/15/13.  Surgery was initially scheduled for earlier this month, but was delayed to allow time for a right arm laceration to heal.  History includes former smoker, MVP/severe MR, afib, CHF, 2.7 cm AAA by CTA 03/03/13, HLD, BPH, hearing loss, OSA with CPAP use, skin cancer, asymptomatic T5-6 spinal stenosis with plans for future out-patient Neurosurgery consultation (see Dr. Guy Sandifer 03/03/13 note).  PCP is Dr. Lavone Orn. Cardiologist is Dr. Fransico Him.    Currently, his last EKG is from 06/13/12 and showed SB with sinus arrhythmia.  He will need an EKG on the day of surgery to be within TCTS orders of an EKG within the past month.    TEE on 01/31/13 showed severe MVP of the posterior MV leaflet with severe MR.  EF 55-60%.  Mild to moderate TR.  Cardiac cath on 01/24/13 showed: 1. Nonobstructive ASCAD with a30-40% stenosis in the ostium of the OM1 otherwise normal coronary arteries.  2. Normal LVF by echo  3. Severe posterior MVP with severe eccentric MR by echo  4. Mild pulmonary HTN by right heart cath.  5. Atrial fibrilation - patient now back in afib   CXR on 03/13/13 showed:  1. New small bilateral pleural effusions.  2. Pulmonary vascular congestion.  Epic indicates that results were reviewed by Dr. Roxy Manns yesterday.  PFTs on 02/20/13 showed FEV1 1.82 (68%).  CTA of the abd/pelvis on 03/03/13 showed: Diffuse aorto iliac and visceral atherosclerotic changes. Negative for aortoiliac occlusive process. Small infrarenal abdominal aortic thrombosed saccular aneurysm, maximal diameter 2.4 x 2.7 cm. Small dependent pleural effusions with basilar atelectasis 6.3 cm right inferior renal cyst Enlarged prostate gland.  Carotid duplex on 02/20/13 showed: No significant extracranial carotid artery stenosis demonstrated. Vertebrals are patent with antegrade  flow.  Preoperative labs noted.  Cr 1.76 (previously 1.40 -1.82 in March 2014).  H/H 12.7/38.1.  PT/INR 21/1.89, PTT 37.  A1C 5.7. UA WNL. Will repeat PT/INR on arrival.  He will be evaluated by his assigned anesthesiologist on the day of surgery, but if no acute changes then would anticipate that he could proceed as planned.  George Hugh [facility]/Anesthesiology Phone [phone] 03/14/2013 10:20 AM

## 2013-03-15 ENCOUNTER — Encounter (HOSPITAL_COMMUNITY): Payer: Self-pay | Admitting: Vascular Surgery

## 2013-03-15 ENCOUNTER — Inpatient Hospital Stay (HOSPITAL_COMMUNITY): Payer: Medicare Other | Admitting: Anesthesiology

## 2013-03-15 ENCOUNTER — Inpatient Hospital Stay (HOSPITAL_COMMUNITY)
Admission: RE | Admit: 2013-03-15 | Discharge: 2013-03-21 | DRG: 220 | Disposition: A | Discharge status: Home / Self Care | Payer: Medicare Other | Source: Ambulatory Visit | Attending: Thoracic Surgery (Cardiothoracic Vascular Surgery) | Admitting: Thoracic Surgery (Cardiothoracic Vascular Surgery)

## 2013-03-15 ENCOUNTER — Inpatient Hospital Stay (HOSPITAL_COMMUNITY): Payer: Medicare Other

## 2013-03-15 ENCOUNTER — Encounter (HOSPITAL_COMMUNITY): Payer: Self-pay | Admitting: *Deleted

## 2013-03-15 ENCOUNTER — Encounter (HOSPITAL_COMMUNITY)
Admission: RE | Disposition: A | Discharge status: Home / Self Care | Payer: Self-pay | Source: Ambulatory Visit | Attending: Thoracic Surgery (Cardiothoracic Vascular Surgery)

## 2013-03-15 ENCOUNTER — Other Ambulatory Visit: Payer: Self-pay

## 2013-03-15 DIAGNOSIS — Z7982 Long term (current) use of aspirin: Secondary | ICD-10-CM

## 2013-03-15 DIAGNOSIS — I059 Rheumatic mitral valve disease, unspecified: Principal | ICD-10-CM

## 2013-03-15 DIAGNOSIS — E785 Hyperlipidemia, unspecified: Secondary | ICD-10-CM | POA: Diagnosis present

## 2013-03-15 DIAGNOSIS — I509 Heart failure, unspecified: Secondary | ICD-10-CM | POA: Diagnosis present

## 2013-03-15 DIAGNOSIS — I5032 Chronic diastolic (congestive) heart failure: Secondary | ICD-10-CM | POA: Diagnosis present

## 2013-03-15 DIAGNOSIS — G4733 Obstructive sleep apnea (adult) (pediatric): Secondary | ICD-10-CM | POA: Diagnosis present

## 2013-03-15 DIAGNOSIS — M4804 Spinal stenosis, thoracic region: Secondary | ICD-10-CM | POA: Diagnosis present

## 2013-03-15 DIAGNOSIS — I700 Atherosclerosis of aorta: Secondary | ICD-10-CM | POA: Diagnosis present

## 2013-03-15 DIAGNOSIS — Z01812 Encounter for preprocedural laboratory examination: Secondary | ICD-10-CM

## 2013-03-15 DIAGNOSIS — Z7901 Long term (current) use of anticoagulants: Secondary | ICD-10-CM

## 2013-03-15 DIAGNOSIS — Z79899 Other long term (current) drug therapy: Secondary | ICD-10-CM

## 2013-03-15 DIAGNOSIS — H9190 Unspecified hearing loss, unspecified ear: Secondary | ICD-10-CM | POA: Diagnosis present

## 2013-03-15 DIAGNOSIS — I714 Abdominal aortic aneurysm, without rupture, unspecified: Secondary | ICD-10-CM | POA: Diagnosis present

## 2013-03-15 DIAGNOSIS — Z8249 Family history of ischemic heart disease and other diseases of the circulatory system: Secondary | ICD-10-CM

## 2013-03-15 DIAGNOSIS — I4891 Unspecified atrial fibrillation: Secondary | ICD-10-CM

## 2013-03-15 DIAGNOSIS — Z87891 Personal history of nicotine dependence: Secondary | ICD-10-CM

## 2013-03-15 DIAGNOSIS — N189 Chronic kidney disease, unspecified: Secondary | ICD-10-CM | POA: Diagnosis present

## 2013-03-15 DIAGNOSIS — Z9889 Other specified postprocedural states: Secondary | ICD-10-CM

## 2013-03-15 DIAGNOSIS — K5900 Constipation, unspecified: Secondary | ICD-10-CM | POA: Diagnosis present

## 2013-03-15 DIAGNOSIS — I498 Other specified cardiac arrhythmias: Secondary | ICD-10-CM | POA: Diagnosis present

## 2013-03-15 DIAGNOSIS — I079 Rheumatic tricuspid valve disease, unspecified: Secondary | ICD-10-CM | POA: Diagnosis present

## 2013-03-15 DIAGNOSIS — N289 Disorder of kidney and ureter, unspecified: Secondary | ICD-10-CM | POA: Diagnosis present

## 2013-03-15 DIAGNOSIS — Z833 Family history of diabetes mellitus: Secondary | ICD-10-CM

## 2013-03-15 DIAGNOSIS — Z8679 Personal history of other diseases of the circulatory system: Secondary | ICD-10-CM

## 2013-03-15 DIAGNOSIS — Z8261 Family history of arthritis: Secondary | ICD-10-CM

## 2013-03-15 DIAGNOSIS — G3184 Mild cognitive impairment, so stated: Secondary | ICD-10-CM | POA: Diagnosis present

## 2013-03-15 DIAGNOSIS — R413 Other amnesia: Secondary | ICD-10-CM | POA: Diagnosis present

## 2013-03-15 DIAGNOSIS — N400 Enlarged prostate without lower urinary tract symptoms: Secondary | ICD-10-CM | POA: Diagnosis present

## 2013-03-15 DIAGNOSIS — M19049 Primary osteoarthritis, unspecified hand: Secondary | ICD-10-CM | POA: Diagnosis present

## 2013-03-15 DIAGNOSIS — J90 Pleural effusion, not elsewhere classified: Secondary | ICD-10-CM | POA: Diagnosis not present

## 2013-03-15 DIAGNOSIS — Z85828 Personal history of other malignant neoplasm of skin: Secondary | ICD-10-CM

## 2013-03-15 DIAGNOSIS — D62 Acute posthemorrhagic anemia: Secondary | ICD-10-CM | POA: Diagnosis not present

## 2013-03-15 DIAGNOSIS — D696 Thrombocytopenia, unspecified: Secondary | ICD-10-CM | POA: Diagnosis not present

## 2013-03-15 DIAGNOSIS — N529 Male erectile dysfunction, unspecified: Secondary | ICD-10-CM | POA: Diagnosis present

## 2013-03-15 HISTORY — DX: Personal history of other diseases of the circulatory system: Z86.79

## 2013-03-15 HISTORY — PX: INTRAOPERATIVE TRANSESOPHAGEAL ECHOCARDIOGRAM: SHX5062

## 2013-03-15 HISTORY — DX: Other specified postprocedural states: Z98.890

## 2013-03-15 HISTORY — DX: Chronic diastolic (congestive) heart failure: I50.32

## 2013-03-15 HISTORY — PX: MITRAL VALVE REPAIR: SHX2039

## 2013-03-15 HISTORY — PX: MINIMALLY INVASIVE MAZE PROCEDURE: SHX6244

## 2013-03-15 LAB — PLATELET COUNT: Platelets: 95 10*3/uL — ABNORMAL LOW (ref 150–400)

## 2013-03-15 LAB — POCT I-STAT 3, ART BLOOD GAS (G3+)
Acid-base deficit: 1 mmol/L (ref 0.0–2.0)
Acid-base deficit: 1 mmol/L (ref 0.0–2.0)
Acid-base deficit: 4 mmol/L — ABNORMAL HIGH (ref 0.0–2.0)
Bicarbonate: 25.2 mEq/L — ABNORMAL HIGH (ref 20.0–24.0)
Bicarbonate: 21.2 mEq/L (ref 20.0–24.0)
Bicarbonate: 22 mEq/L (ref 20.0–24.0)
O2 Saturation: 100 %
O2 Saturation: 100 %
O2 Saturation: 96 %
Patient temperature: 42.03
TCO2: 27 mmol/L (ref 0–100)
TCO2: 22 mmol/L (ref 0–100)
pCO2 arterial: 37.9 mmHg (ref 35.0–45.0)
pH, Arterial: 7.369 (ref 7.350–7.450)
pH, Arterial: 7.356 (ref 7.350–7.450)
pO2, Arterial: 439 mmHg — ABNORMAL HIGH (ref 80.0–100.0)
pO2, Arterial: 250 mmHg — ABNORMAL HIGH (ref 80.0–100.0)

## 2013-03-15 LAB — POCT I-STAT 4, (NA,K, GLUC, HGB,HCT)
Glucose, Bld: 75 mg/dL (ref 70–99)
Glucose, Bld: 84 mg/dL (ref 70–99)
Glucose, Bld: 115 mg/dL — ABNORMAL HIGH (ref 70–99)
Glucose, Bld: 125 mg/dL — ABNORMAL HIGH (ref 70–99)
HCT: 25 % — ABNORMAL LOW (ref 39.0–52.0)
Hemoglobin: 9.5 g/dL — ABNORMAL LOW (ref 13.0–17.0)
Hemoglobin: 7.5 g/dL — ABNORMAL LOW (ref 13.0–17.0)
Hemoglobin: 11.2 g/dL — ABNORMAL LOW (ref 13.0–17.0)
Potassium: 4 mEq/L (ref 3.5–5.1)
Potassium: 4.1 mEq/L (ref 3.5–5.1)
Potassium: 4.5 mEq/L (ref 3.5–5.1)
Potassium: 3.8 mEq/L (ref 3.5–5.1)
Sodium: 139 mEq/L (ref 135–145)
Sodium: 139 mEq/L (ref 135–145)
Sodium: 138 mEq/L (ref 135–145)
Sodium: 141 mEq/L (ref 135–145)

## 2013-03-15 LAB — CBC
Hemoglobin: 10.8 g/dL — ABNORMAL LOW (ref 13.0–17.0)
MCH: 30.1 pg (ref 26.0–34.0)
MCH: 29.8 pg (ref 26.0–34.0)
MCHC: 33.6 g/dL (ref 30.0–36.0)
MCV: 88.6 fL (ref 78.0–100.0)
Platelets: 103 10*3/uL — ABNORMAL LOW (ref 150–400)
RBC: 3.59 MIL/uL — ABNORMAL LOW (ref 4.22–5.81)
RDW: 13.5 % (ref 11.5–15.5)

## 2013-03-15 LAB — PROTIME-INR: Prothrombin Time: 20.1 seconds — ABNORMAL HIGH (ref 11.6–15.2)

## 2013-03-15 LAB — GLUCOSE, CAPILLARY
Glucose-Capillary: 99 mg/dL (ref 70–99)
Glucose-Capillary: 95 mg/dL (ref 70–99)

## 2013-03-15 LAB — CREATININE, SERUM
Creatinine, Ser: 1.52 mg/dL — ABNORMAL HIGH (ref 0.50–1.35)
GFR calc Af Amer: 47 mL/min — ABNORMAL LOW (ref >90)
GFR calc non Af Amer: 41 mL/min — ABNORMAL LOW (ref >90)

## 2013-03-15 LAB — POCT I-STAT, CHEM 8
HCT: 32 % — ABNORMAL LOW (ref 39.0–52.0)
Hemoglobin: 10.9 g/dL — ABNORMAL LOW (ref 13.0–17.0)
Sodium: 140 mEq/L (ref 135–145)
TCO2: 20 mmol/L (ref 0–100)

## 2013-03-15 LAB — HEMOGLOBIN AND HEMATOCRIT, BLOOD
HCT: 22.9 % — ABNORMAL LOW (ref 39.0–52.0)
Hemoglobin: 7.9 g/dL — ABNORMAL LOW (ref 13.0–17.0)

## 2013-03-15 SURGERY — REPAIR, MITRAL VALVE, MINIMALLY INVASIVE
Anesthesia: General | Site: Chest | Laterality: Right | Wound class: Clean

## 2013-03-15 MED ORDER — ALBUMIN HUMAN 5 % IV SOLN
INTRAVENOUS | Status: DC | PRN
  Administered 2013-03-15: 15:00:00 via INTRAVENOUS

## 2013-03-15 MED ORDER — LACTATED RINGERS IV SOLN: INTRAVENOUS | Status: DC

## 2013-03-15 MED ORDER — LACTATED RINGERS IV SOLN
INTRAVENOUS | Status: DC | PRN
  Administered 2013-03-15 (×2): via INTRAVENOUS

## 2013-03-15 MED ORDER — SODIUM CHLORIDE 0.9 % IV SOLN: 250.0000 mL | INTRAVENOUS | Status: DC

## 2013-03-15 MED ORDER — SODIUM CHLORIDE 0.9 % IV SOLN: INTRAVENOUS | Status: DC

## 2013-03-15 MED ORDER — FAMOTIDINE IN NACL 20-0.9 MG/50ML-% IV SOLN: 20.0000 mg | Freq: Two times a day (BID) | INTRAVENOUS | Status: DC

## 2013-03-15 MED ORDER — POTASSIUM CHLORIDE 10 MEQ/50ML IV SOLN
10.0000 meq | INTRAVENOUS | Status: AC
  Administered 2013-03-15 (×3): 10 meq via INTRAVENOUS

## 2013-03-15 MED ORDER — ACETAMINOPHEN 500 MG PO TABS
1000.0000 mg | ORAL_TABLET | Freq: Four times a day (QID) | ORAL | Status: AC
  Administered 2013-03-16 – 2013-03-20 (×16): 1000 mg via ORAL
  Filled 2013-03-15 (×21): qty 2

## 2013-03-15 MED ORDER — ROCURONIUM BROMIDE 100 MG/10ML IV SOLN
INTRAVENOUS | Status: DC | PRN
  Administered 2013-03-15 (×2): 50 mg via INTRAVENOUS

## 2013-03-15 MED ORDER — MILRINONE IN DEXTROSE 20 MG/100ML IV SOLN
0.0000 ug/kg/min | INTRAVENOUS | Status: DC
  Administered 2013-03-16: 0.3 ug/kg/min via INTRAVENOUS
  Filled 2013-03-15: qty 100

## 2013-03-15 MED ORDER — PROPOFOL 10 MG/ML IV BOLUS
INTRAVENOUS | Status: DC | PRN
  Administered 2013-03-15: 70 mg via INTRAVENOUS

## 2013-03-15 MED ORDER — MILRINONE IN DEXTROSE 20 MG/100ML IV SOLN
0.1250 ug/kg/min | INTRAVENOUS | Status: AC
  Administered 2013-03-15: .3 mg/kg/min via INTRAVENOUS
  Filled 2013-03-15: qty 100

## 2013-03-15 MED ORDER — PHENYLEPHRINE HCL 10 MG/ML IJ SOLN
INTRAMUSCULAR | Status: DC | PRN
  Administered 2013-03-15 (×2): 40 ug via INTRAVENOUS

## 2013-03-15 MED ORDER — AMINOCAPROIC ACID 250 MG/ML IV SOLN
INTRAVENOUS | Status: DC | PRN
  Administered 2013-03-15: 5 g via INTRAVENOUS

## 2013-03-15 MED ORDER — FAMOTIDINE IN NACL 20-0.9 MG/50ML-% IV SOLN
20.0000 mg | Freq: Two times a day (BID) | INTRAVENOUS | Status: AC
  Administered 2013-03-15: 20 mg via INTRAVENOUS

## 2013-03-15 MED ORDER — NITROGLYCERIN IN D5W 200-5 MCG/ML-% IV SOLN: 0.0000 ug/min | INTRAVENOUS | Status: DC

## 2013-03-15 MED ORDER — DEXMEDETOMIDINE HCL IN NACL 200 MCG/50ML IV SOLN
0.1000 ug/kg/h | INTRAVENOUS | Status: DC
  Administered 2013-03-15: 0.7 ug/kg/h via INTRAVENOUS
  Filled 2013-03-15: qty 50

## 2013-03-15 MED ORDER — HEMOSTATIC AGENTS (NO CHARGE) OPTIME
TOPICAL | Status: DC | PRN
  Administered 2013-03-15: 1 via TOPICAL

## 2013-03-15 MED ORDER — SODIUM CHLORIDE 0.45 % IV SOLN
INTRAVENOUS | Status: DC
  Administered 2013-03-15: 20 mL/h via INTRAVENOUS

## 2013-03-15 MED ORDER — METOPROLOL TARTRATE 25 MG/10 ML ORAL SUSPENSION
12.5000 mg | Freq: Two times a day (BID) | ORAL | Status: DC
  Filled 2013-03-15 (×3): qty 5

## 2013-03-15 MED ORDER — MORPHINE SULFATE 2 MG/ML IJ SOLN: 1.0000 mg | INTRAMUSCULAR | Status: AC | PRN

## 2013-03-15 MED ORDER — PHENYLEPHRINE HCL 10 MG/ML IJ SOLN
10.0000 mg | INTRAVENOUS | Status: DC | PRN
  Administered 2013-03-15: 50 ug/min via INTRAVENOUS

## 2013-03-15 MED ORDER — SODIUM CHLORIDE 0.9 % IJ SOLN: 3.0000 mL | INTRAMUSCULAR | Status: DC | PRN

## 2013-03-15 MED ORDER — MAGNESIUM SULFATE 40 MG/ML IJ SOLN
4.0000 g | Freq: Once | INTRAMUSCULAR | Status: AC
  Administered 2013-03-15: 4 g via INTRAVENOUS
  Filled 2013-03-15: qty 100

## 2013-03-15 MED ORDER — ACETAMINOPHEN 160 MG/5ML PO SOLN
975.0000 mg | Freq: Four times a day (QID) | ORAL | Status: DC
  Administered 2013-03-15: 975 mg
  Filled 2013-03-15: qty 40.6

## 2013-03-15 MED ORDER — ALBUMIN HUMAN 5 % IV SOLN
250.0000 mL | INTRAVENOUS | Status: AC | PRN
  Administered 2013-03-15 – 2013-03-16 (×4): 250 mL via INTRAVENOUS
  Filled 2013-03-15 (×2): qty 250

## 2013-03-15 MED ORDER — PROTAMINE SULFATE 10 MG/ML IV SOLN
INTRAVENOUS | Status: DC | PRN
  Administered 2013-03-15: 50 mg via INTRAVENOUS
  Administered 2013-03-15: 30 mg via INTRAVENOUS
  Administered 2013-03-15 (×2): 50 mg via INTRAVENOUS
  Administered 2013-03-15: 20 mg via INTRAVENOUS

## 2013-03-15 MED ORDER — METOPROLOL TARTRATE 1 MG/ML IV SOLN: 2.5000 mg | INTRAVENOUS | Status: DC | PRN

## 2013-03-15 MED ORDER — MORPHINE SULFATE 2 MG/ML IJ SOLN: 2.0000 mg | INTRAMUSCULAR | Status: DC | PRN

## 2013-03-15 MED ORDER — INSULIN REGULAR BOLUS VIA INFUSION
0.0000 [IU] | Freq: Three times a day (TID) | INTRAVENOUS | Status: DC
  Filled 2013-03-15: qty 10

## 2013-03-15 MED ORDER — ACETAMINOPHEN 10 MG/ML IV SOLN
1000.0000 mg | Freq: Once | INTRAVENOUS | Status: AC
  Administered 2013-03-15: 1000 mg via INTRAVENOUS
  Filled 2013-03-15: qty 100

## 2013-03-15 MED ORDER — ASPIRIN EC 325 MG PO TBEC
325.0000 mg | DELAYED_RELEASE_TABLET | Freq: Every day | ORAL | Status: DC
  Administered 2013-03-16: 325 mg via ORAL
  Filled 2013-03-15 (×2): qty 1

## 2013-03-15 MED ORDER — VECURONIUM BROMIDE 10 MG IV SOLR
INTRAVENOUS | Status: DC | PRN
  Administered 2013-03-15: 10 mg via INTRAVENOUS
  Administered 2013-03-15 (×2): 5 mg via INTRAVENOUS

## 2013-03-15 MED ORDER — BISACODYL 10 MG RE SUPP: 10.0000 mg | Freq: Every day | RECTAL | Status: DC

## 2013-03-15 MED ORDER — BISACODYL 5 MG PO TBEC
10.0000 mg | DELAYED_RELEASE_TABLET | Freq: Every day | ORAL | Status: DC
  Administered 2013-03-16 – 2013-03-19 (×4): 10 mg via ORAL
  Filled 2013-03-15 (×4): qty 2

## 2013-03-15 MED ORDER — PANTOPRAZOLE SODIUM 40 MG PO TBEC
40.0000 mg | DELAYED_RELEASE_TABLET | Freq: Every day | ORAL | Status: DC
  Administered 2013-03-17 – 2013-03-19 (×3): 40 mg via ORAL
  Filled 2013-03-15 (×3): qty 1

## 2013-03-15 MED ORDER — FENTANYL CITRATE 0.05 MG/ML IJ SOLN
INTRAMUSCULAR | Status: DC | PRN
  Administered 2013-03-15: 100 ug via INTRAVENOUS
  Administered 2013-03-15 (×2): 50 ug via INTRAVENOUS
  Administered 2013-03-15 (×3): 100 ug via INTRAVENOUS
  Administered 2013-03-15: 150 ug via INTRAVENOUS
  Administered 2013-03-15: 50 ug via INTRAVENOUS
  Administered 2013-03-15: 250 ug via INTRAVENOUS
  Administered 2013-03-15: 50 ug via INTRAVENOUS
  Administered 2013-03-15: 250 ug via INTRAVENOUS
  Administered 2013-03-15: 100 ug via INTRAVENOUS

## 2013-03-15 MED ORDER — INSULIN ASPART 100 UNIT/ML ~~LOC~~ SOLN: 0.0000 [IU] | SUBCUTANEOUS | Status: AC

## 2013-03-15 MED ORDER — ONDANSETRON HCL 4 MG/2ML IJ SOLN: 4.0000 mg | Freq: Four times a day (QID) | INTRAMUSCULAR | Status: DC | PRN

## 2013-03-15 MED ORDER — ASPIRIN 81 MG PO CHEW: 324.0000 mg | CHEWABLE_TABLET | Freq: Every day | ORAL | Status: DC

## 2013-03-15 MED ORDER — METOPROLOL TARTRATE 12.5 MG HALF TABLET
12.5000 mg | ORAL_TABLET | Freq: Two times a day (BID) | ORAL | Status: DC
  Filled 2013-03-15 (×3): qty 1

## 2013-03-15 MED ORDER — SODIUM CHLORIDE 0.9 % IV SOLN
INTRAVENOUS | Status: DC | PRN
  Administered 2013-03-15 (×2): via INTRAVENOUS

## 2013-03-15 MED ORDER — MIDAZOLAM HCL 5 MG/5ML IJ SOLN
INTRAMUSCULAR | Status: DC | PRN
  Administered 2013-03-15: 1 mg via INTRAVENOUS
  Administered 2013-03-15: 3 mg via INTRAVENOUS
  Administered 2013-03-15: 1 mg via INTRAVENOUS
  Administered 2013-03-15: 3 mg via INTRAVENOUS
  Administered 2013-03-15: 2 mg via INTRAVENOUS

## 2013-03-15 MED ORDER — SODIUM CHLORIDE 0.9 % IJ SOLN: 3.0000 mL | Freq: Two times a day (BID) | INTRAMUSCULAR | Status: DC

## 2013-03-15 MED ORDER — ARTIFICIAL TEARS OP OINT
TOPICAL_OINTMENT | OPHTHALMIC | Status: DC | PRN
  Administered 2013-03-15: 1 via OPHTHALMIC

## 2013-03-15 MED ORDER — LACTATED RINGERS IV SOLN
INTRAVENOUS | Status: DC | PRN
  Administered 2013-03-15 (×2): via INTRAVENOUS

## 2013-03-15 MED ORDER — LACTATED RINGERS IV SOLN: 500.0000 mL | Freq: Once | INTRAVENOUS | Status: AC | PRN

## 2013-03-15 MED ORDER — LACTATED RINGERS IV SOLN
INTRAVENOUS | Status: DC | PRN
  Administered 2013-03-15: 08:00:00 via INTRAVENOUS

## 2013-03-15 MED ORDER — DEXTROSE 5 % IV SOLN
1.5000 g | Freq: Two times a day (BID) | INTRAVENOUS | Status: AC
  Administered 2013-03-15 – 2013-03-17 (×4): 1.5 g via INTRAVENOUS
  Filled 2013-03-15 (×4): qty 1.5

## 2013-03-15 MED ORDER — VANCOMYCIN HCL IN DEXTROSE 1-5 GM/200ML-% IV SOLN
1000.0000 mg | Freq: Once | INTRAVENOUS | Status: AC
  Administered 2013-03-15: 1000 mg via INTRAVENOUS
  Filled 2013-03-15: qty 200

## 2013-03-15 MED ORDER — IPRATROPIUM-ALBUTEROL 20-100 MCG/ACT IN AERS
2.0000 | INHALATION_SPRAY | RESPIRATORY_TRACT | Status: DC
  Administered 2013-03-16: 2 via RESPIRATORY_TRACT
  Filled 2013-03-15: qty 4

## 2013-03-15 MED ORDER — MIDAZOLAM HCL 2 MG/2ML IJ SOLN: 2.0000 mg | INTRAMUSCULAR | Status: DC | PRN

## 2013-03-15 MED ORDER — OXYCODONE HCL 5 MG PO TABS
5.0000 mg | ORAL_TABLET | ORAL | Status: DC | PRN
  Administered 2013-03-16: 10 mg via ORAL
  Administered 2013-03-16 – 2013-03-17 (×5): 5 mg via ORAL
  Filled 2013-03-15 (×5): qty 1
  Filled 2013-03-15: qty 2

## 2013-03-15 MED ORDER — DOPAMINE-DEXTROSE 3.2-5 MG/ML-% IV SOLN: 0.0000 ug/kg/min | INTRAVENOUS | Status: DC

## 2013-03-15 MED ORDER — 0.9 % SODIUM CHLORIDE (POUR BTL) OPTIME
TOPICAL | Status: DC | PRN
  Administered 2013-03-15: 6000 mL

## 2013-03-15 MED ORDER — SODIUM CHLORIDE 0.9 % IV SOLN
0.5000 g/h | Freq: Once | INTRAVENOUS | Status: DC
  Filled 2013-03-15: qty 20

## 2013-03-15 MED ORDER — HEPARIN SODIUM (PORCINE) 1000 UNIT/ML IJ SOLN
INTRAMUSCULAR | Status: DC | PRN
  Administered 2013-03-15: 24000 [IU] via INTRAVENOUS

## 2013-03-15 MED ORDER — PHENYLEPHRINE HCL 10 MG/ML IJ SOLN
0.0000 ug/min | INTRAVENOUS | Status: DC
  Administered 2013-03-16: 30 ug/min via INTRAVENOUS
  Filled 2013-03-15 (×3): qty 2

## 2013-03-15 MED ORDER — ALBUMIN HUMAN 5 % IV SOLN: INTRAVENOUS | Status: DC | PRN

## 2013-03-15 MED ORDER — DOCUSATE SODIUM 100 MG PO CAPS
200.0000 mg | ORAL_CAPSULE | Freq: Every day | ORAL | Status: DC
  Administered 2013-03-16 – 2013-03-20 (×4): 200 mg via ORAL
  Filled 2013-03-15 (×6): qty 2

## 2013-03-15 SURGICAL SUPPLY — 114 items
ADAPTER CARDIO PERF ANTE/RETRO (ADAPTER) ×2 IMPLANT
ADH SKN CLS APL DERMABOND .7 (GAUZE/BANDAGES/DRESSINGS) ×2
ADPR PRFSN 84XANTGRD RTRGD (ADAPTER) ×1
APL SKNCLS STERI-STRIP NONHPOA (GAUZE/BANDAGES/DRESSINGS) ×1
ATTRACTOMAT 16X20 MAGNETIC DRP (DRAPES) ×2 IMPLANT
BAG DECANTER FOR FLEXI CONT (MISCELLANEOUS) ×2 IMPLANT
BENZOIN TINCTURE PRP APPL 2/3 (GAUZE/BANDAGES/DRESSINGS) ×2 IMPLANT
BLADE SURG 11 STRL SS (BLADE) ×2 IMPLANT
CANISTER SUCTION 2500CC (MISCELLANEOUS) ×4 IMPLANT
CANNULA FEM VENOUS REMOTE 22FR (CANNULA) ×2 IMPLANT
CANNULA FEMORAL ART 14 SM (MISCELLANEOUS) ×2 IMPLANT
CANNULA GUNDRY RCSP 15FR (MISCELLANEOUS) ×2 IMPLANT
CARDIAC SUCTION (MISCELLANEOUS) ×2 IMPLANT
CATH KIT ON Q 5IN SLV (PAIN MANAGEMENT) IMPLANT
CATH THORACIC 28FR (CATHETERS) ×2 IMPLANT
CLOTH BEACON ORANGE TIMEOUT ST (SAFETY) ×2 IMPLANT
CONN ST 1/4X3/8  BEN (MISCELLANEOUS) ×2
CONN ST 1/4X3/8 BEN (MISCELLANEOUS) ×2 IMPLANT
CONT SPEC STER OR (MISCELLANEOUS) IMPLANT
COVER MAYO STAND STRL (DRAPES) ×2 IMPLANT
COVER SURGICAL LIGHT HANDLE (MISCELLANEOUS) ×2 IMPLANT
CRADLE DONUT ADULT HEAD (MISCELLANEOUS) ×2 IMPLANT
DERMABOND ADVANCED (GAUZE/BANDAGES/DRESSINGS) ×2
DERMABOND ADVANCED .7 DNX12 (GAUZE/BANDAGES/DRESSINGS) ×2 IMPLANT
DEVICE PMI PUNCTURE CLOSURE (MISCELLANEOUS) ×2 IMPLANT
DEVICE TROCAR PUNCTURE CLOSURE (ENDOMECHANICALS) ×2 IMPLANT
DRAIN CHANNEL 28F RND 3/8 FF (WOUND CARE) ×4 IMPLANT
DRAPE BILATERAL SPLIT (DRAPES) ×2 IMPLANT
DRAPE C-ARM 42X72 X-RAY (DRAPES) ×2 IMPLANT
DRAPE CV SPLIT W-CLR ANES SCRN (DRAPES) ×2 IMPLANT
DRAPE INCISE IOBAN 66X45 STRL (DRAPES) ×6 IMPLANT
DRAPE SLUSH/WARMER DISC (DRAPES) ×2 IMPLANT
DRSG COVADERM 4X8 (GAUZE/BANDAGES/DRESSINGS) ×2 IMPLANT
ELECT BLADE 6.5 EXT (BLADE) ×2 IMPLANT
ELECT REM PT RETURN 9FT ADLT (ELECTROSURGICAL) ×4
ELECTRODE REM PT RTRN 9FT ADLT (ELECTROSURGICAL) ×2 IMPLANT
GLOVE BIOGEL PI IND STRL 6 (GLOVE) ×3 IMPLANT
GLOVE BIOGEL PI IND STRL 6.5 (GLOVE) ×6 IMPLANT
GLOVE BIOGEL PI INDICATOR 6 (GLOVE) ×3
GLOVE BIOGEL PI INDICATOR 6.5 (GLOVE) ×6
GLOVE ORTHO TXT STRL SZ7.5 (GLOVE) ×6 IMPLANT
GLOVE SURG SS PI 7.5 STRL IVOR (GLOVE) ×4 IMPLANT
GOWN STRL NON-REIN LRG LVL3 (GOWN DISPOSABLE) ×16 IMPLANT
KIT DILATOR VASC 18G NDL (KITS) ×2 IMPLANT
KIT DRAINAGE VACCUM ASSIST (KITS) ×2 IMPLANT
KIT ROOM TURNOVER OR (KITS) ×2 IMPLANT
KIT SUCTION CATH 14FR (SUCTIONS) ×2 IMPLANT
LEAD PACING MYOCARDI (MISCELLANEOUS) ×4 IMPLANT
LINE VENT (MISCELLANEOUS) ×2 IMPLANT
NEEDLE AORTIC ROOT 14G 7F (CATHETERS) ×2 IMPLANT
NS IRRIG 1000ML POUR BTL (IV SOLUTION) ×10 IMPLANT
PACK OPEN HEART (CUSTOM PROCEDURE TRAY) ×2 IMPLANT
PAD ARMBOARD 7.5X6 YLW CONV (MISCELLANEOUS) ×4 IMPLANT
PAD ELECT DEFIB RADIOL ZOLL (MISCELLANEOUS) ×2 IMPLANT
PATCH CORMATRIX 4CMX7CM (Prosthesis & Implant Heart) ×2 IMPLANT
PROBE CRYO2-ABLATION MALLABLE (MISCELLANEOUS) ×2 IMPLANT
RETRACTOR TRM SOFT TISSUE 7.5 (INSTRUMENTS) ×2 IMPLANT
RING MITRAL MEMO 3D 32MM SMD32 (Prosthesis & Implant Heart) ×2 IMPLANT
SET CANNULATION TOURNIQUET (MISCELLANEOUS) ×2 IMPLANT
SET CARDIOPLEGIA MPS 5001102 (MISCELLANEOUS) ×2 IMPLANT
SET IRRIG TUBING LAPAROSCOPIC (IRRIGATION / IRRIGATOR) ×2 IMPLANT
SOLUTION ANTI FOG 6CC (MISCELLANEOUS) ×2 IMPLANT
SPONGE GAUZE 4X4 12PLY (GAUZE/BANDAGES/DRESSINGS) ×6 IMPLANT
SUCKER WEIGHTED FLEX (MISCELLANEOUS) ×4 IMPLANT
SUT BONE WAX W31G (SUTURE) ×2 IMPLANT
SUT E-PACK MINIMALLY INVASIVE (SUTURE) ×2 IMPLANT
SUT ETHIBOND (SUTURE) ×4 IMPLANT
SUT ETHIBOND 2 0 SH (SUTURE) ×2 IMPLANT
SUT ETHIBOND 2 0 V4 (SUTURE) IMPLANT
SUT ETHIBOND 2 0V4 GREEN (SUTURE) IMPLANT
SUT ETHIBOND 2-0 RB-1 WHT (SUTURE) ×4 IMPLANT
SUT ETHIBOND 5 0 C 1 30 (SUTURE) ×2 IMPLANT
SUT ETHIBOND NAB MH 2-0 36IN (SUTURE) IMPLANT
SUT GORETEX CV 4 TH 22 36 (SUTURE) ×2 IMPLANT
SUT GORETEX CV-5THC-13 36IN (SUTURE) ×6 IMPLANT
SUT GORETEX CV4 TH-18 (SUTURE) ×20 IMPLANT
SUT GORETEX TH-18 36 INCH (SUTURE) IMPLANT
SUT MNCRL AB 3-0 PS2 18 (SUTURE) IMPLANT
SUT PROLENE 3 0 SH DA (SUTURE) IMPLANT
SUT PROLENE 3 0 SH1 36 (SUTURE) ×12 IMPLANT
SUT PROLENE 4 0 RB 1 (SUTURE) ×14
SUT PROLENE 4-0 RB1 .5 CRCL 36 (SUTURE) ×7 IMPLANT
SUT PROLENE 5 0 C 1 36 (SUTURE) ×16 IMPLANT
SUT PROLENE 6 0 C 1 30 (SUTURE) ×2 IMPLANT
SUT SILK  1 MH (SUTURE)
SUT SILK 1 MH (SUTURE) IMPLANT
SUT SILK 1 TIES 10X30 (SUTURE) IMPLANT
SUT SILK 2 0 SH CR/8 (SUTURE) IMPLANT
SUT SILK 2 0 TIES 10X30 (SUTURE) IMPLANT
SUT SILK 2 0SH CR/8 30 (SUTURE) IMPLANT
SUT SILK 3 0 (SUTURE)
SUT SILK 3 0 SH CR/8 (SUTURE) IMPLANT
SUT SILK 3 0SH CR/8 30 (SUTURE) IMPLANT
SUT SILK 3-0 18XBRD TIE 12 (SUTURE) IMPLANT
SUT TEM PAC WIRE 2 0 SH (SUTURE) IMPLANT
SUT VIC AB 2-0 CTX 36 (SUTURE) IMPLANT
SUT VIC AB 2-0 UR6 27 (SUTURE) ×4 IMPLANT
SUT VIC AB 3-0 MH 27 (SUTURE) ×4 IMPLANT
SUT VIC AB 3-0 SH 8-18 (SUTURE) IMPLANT
SUT VICRYL 2 TP 1 (SUTURE) ×2 IMPLANT
SYRINGE 10CC LL (SYRINGE) ×2 IMPLANT
SYS ATRICLIP LAA EXCLUSION 45 (CLIP) IMPLANT
SYSTEM SAHARA CHEST DRAIN ATS (WOUND CARE) ×2 IMPLANT
TAPE CLOTH SURG 4X10 WHT LF (GAUZE/BANDAGES/DRESSINGS) ×6 IMPLANT
TOWEL OR 17X24 6PK STRL BLUE (TOWEL DISPOSABLE) ×4 IMPLANT
TOWEL OR 17X26 10 PK STRL BLUE (TOWEL DISPOSABLE) ×4 IMPLANT
TRAY FOLEY BAG SILVER LF 14FR (CATHETERS) ×2 IMPLANT
TROCAR XCEL BLADELESS 5X75MML (TROCAR) ×2 IMPLANT
TROCAR XCEL NON-BLD 11X100MML (ENDOMECHANICALS) ×4 IMPLANT
TUBE SUCT INTRACARD DLP 20F (MISCELLANEOUS) ×2 IMPLANT
TUNNELER SHEATH ON-Q 11GX8 (MISCELLANEOUS) IMPLANT
UNDERPAD 30X30 INCONTINENT (UNDERPADS AND DIAPERS) ×2 IMPLANT
WATER STERILE IRR 1000ML POUR (IV SOLUTION) ×4 IMPLANT
WIRE BENTSON .035X145CM (WIRE) ×2 IMPLANT

## 2013-03-15 NOTE — Progress Notes (Signed)
  Echocardiogram Echocardiogram Transesophageal has been performed.  Ardelle Balls A 03/15/2013, 9:48 AM

## 2013-03-15 NOTE — Progress Notes (Signed)
S/p mitral repair, Maze  Intubated, sedated  BP 135/84  Pulse 96  Temp(Src) 98 F (36.7 C) (Oral)  Resp 20  Wt 172 lb (78.019 kg)  BMI 25.39 kg/m2  SpO2 95%  33/24 CI 1.6   Intake/Output Summary (Last 24 hours) at 03/15/13 1709 Last data filed at 03/15/13 1602  Gross per 24 hour  Intake   4470 ml  Output   1300 ml  Net   3170 ml   Minimal CT output  Hct 33 K 3.8  PH 7.27, pCO2 51- will adjust vent

## 2013-03-15 NOTE — Plan of Care (Signed)
Problem: Phase II Progression Outcomes Goal: Cardiac index > or equal to 1.8 Outcome: Progressing Pt is on Milrinone gtt, CI 1.8 to 2 currently

## 2013-03-15 NOTE — Preoperative (Signed)
Beta Blockers   Reason not to administer Beta Blockers:Not Applicable, took metoprolol this am 

## 2013-03-15 NOTE — Anesthesia Preprocedure Evaluation (Addendum)
Anesthesia Evaluation  Patient identified by MRN, date of birth, ID band Patient awake  General Assessment Comment:See Pre-anesthesia note from Pamala Hurry, [place]  Reviewed: Allergy & Precautions, H&P , NPO status , Patient's Chart, lab work & pertinent test results, reviewed documented beta blocker date and time   History of Anesthesia Complications Negative for: history of anesthetic complications  Airway Mallampati: II TM Distance: >3 FB Neck ROM: Full    Dental  (+) Teeth Intact and Dental Advisory Given   Pulmonary sleep apnea and Continuous Positive Airway Pressure Ventilation , former smoker,  breath sounds clear to auscultation        Cardiovascular + CAD, + Peripheral Vascular Disease and +CHF + dysrhythmias Atrial Fibrillation + Valvular Problems/Murmurs MR and MVP Rhythm:Irregular Rate:Normal + Systolic murmurs EF 0000000 AAA   Neuro/Psych negative neurological ROS     GI/Hepatic negative GI ROS, Neg liver ROS,   Endo/Other  negative endocrine ROS  Renal/GU negative Renal ROS     Musculoskeletal   Abdominal Normal abdominal exam  (+)   Peds  Hematology negative hematology ROS (+)   Anesthesia Other Findings   Reproductive/Obstetrics                        Anesthesia Physical Anesthesia Plan  ASA: III  Anesthesia Plan: General   Post-op Pain Management:    Induction: Intravenous  Airway Management Planned: Double Lumen EBT  Additional Equipment: Arterial line, Ultrasound Guidance Line Placement, PA Cath, 3D TEE and CVP  Intra-op Plan:   Post-operative Plan: Post-operative intubation/ventilation  Informed Consent: I have reviewed the patients History and Physical, chart, labs and discussed the procedure including the risks, benefits and alternatives for the proposed anesthesia with the patient or authorized representative who has indicated his/her understanding and acceptance.    Dental advisory given  Plan Discussed with: Anesthesiologist, Surgeon and CRNA  Anesthesia Plan Comments:        Anesthesia Quick Evaluation

## 2013-03-15 NOTE — CV Procedure (Signed)
Intraoperative Transesophageal Echocardiography Report:  [name] is an 79 year old male with a history of severe mitral regurgitation, atrial fibrillation, and congestive heart failure who is scheduled to undergo minimally invasive mitral valve repair by Dr. Roxy Manns. Intraoperative transesophageal echocardiography was requested to assess the mitral valve and determine the adequacy of repair, to assess for any other valvular pathology, and to serve as a monitor for intraoperative volume status.  The patient was brought to the operating room at [facility] and general anesthesia was induced without difficulty. Following endotracheal intubation and orogastric suctioning, the transesophageal echocardiography probe was inserted into the esophagus without difficulty.  Impression: Pre-bypass findings:  1. Mitral valve: There was severe mitral regurgitation. This was due to a flail posterior leaflet in the P2 region with some extension into the P3 region. There was  an anteriorly directed jet of mitral regurgitation which tracted along the superior surface of the anterior leaflet and along the anterior wall of the left atrium. The vena contracta measured 0.46 cm. There were no other flail segments appreciated.  2. Aortic valve: The aortic valve appeared normal. The leaflets opened without obstruction and there was no aortic insufficiency. The leaflets were thin and pliable and coapted normally.  3. Left ventricle: The left ventricular cavity was at the upper limits of normal in diameter and measured 4.86 cm at end diastole at the mid-papillary level in the transgastric short axis view. There were no regional wall motion abnormalities. There was mildly decreased left ventricular systolic function and the ejection fraction was estimated at 50-55%.  4. Pericardium: There was a small pericardial effusion which was posteriorly  located and measured 0.48 cm in diameter.  5. Right ventricle: There was  moderate right ventricular dysfunction. There was decreased contractility of the right ventricular free wall but no septal flattening. The right ventricular cavity was at the upper limits of normal in size.  6. Tricuspid valve: The tricuspid valve appeared structurally normal. There was 1-2+ tricuspid insufficiency with a central jet.  7. Interatrial septum: The interatrial septum was intact without evidence of patent foramen ovale or atrial septal defect by color Doppler or bubble study.  8. Left atrium: The left atrial cavity was moderately enlarged. It measured 4.57 cm in the mediolateral dimension and 6.76 cm in the superior inferior dimension. No thrombus was noted in the left atrium or left atrial appendage.  9.  Ascending aorta: The  ascending aorta showed a well-defined aortic root and sinotubular ridge which was not aneurysmal and with no effacement. The walls of the ascending aorta were mildly thickened but no severe atheromatous disease was noted.  10. Descending aorta: There were scattered atheromatous plaques within the wall the descending aorta. The descending aorta measured 2.56 cm in diameter  Post-bypass findings:  1. Mitral valve: There was an annuloplasty ring in the mitral position. The posterior leaflet was fixed and the anterior leaflet was freely mobile. There was no residual mitral insufficiency. Continuous-wave Doppler interrogation of the mitral inflow revealed a mean transmitral gradient of 2 mm of mercury and a mitral valve area of 2.2 cm using the pressure half-time method.  2. Aortic valve: The aortic valve was unchanged from the pre-bypass study. The leaflets were thin and pliable and open normally and there was no aortic insufficiency.  3. Left ventricle: There was some dyssynchrony of left ventricular contraction due to ventricular pacing. There were no regional wall motion abnormalities and the ejection fraction was estimated at 45-50%.  4. Right ventricle: There  was moderate right ventricular dysfunction which appeared to be of comparable severity to that seen in the pre-operative study.  5. Tricuspid valve: There was 2+ tricuspid insufficiency which was unchanged from the pre-bypass study.  Roberts Gaudy, MD

## 2013-03-15 NOTE — Interval H&P Note (Signed)
History and Physical Interval Note:  03/15/2013 8:26 AM  [name]  has presented today for surgery, with the diagnosis of MITRAL REGURGITATION, ATRIAL FIB  The various methods of treatment have been discussed with the patient and family. After consideration of risks, benefits and other options for treatment, the patient has consented to  Procedure(s) with comments: MINIMALLY INVASIVE MITRAL VALVE REPAIR (MVR) (Right) MINIMALLY INVASIVE MAZE PROCEDURE (Right) - (R) AXILLARY CANNULATION INTRAOPERATIVE TRANSESOPHAGEAL ECHOCARDIOGRAM (Right) as a surgical intervention .  The patient's history has been reviewed, patient examined, no change in status, stable for surgery.  I have reviewed the patient's chart and labs.  Questions were answered to the patient's satisfaction.     [name],[name]

## 2013-03-15 NOTE — Anesthesia Procedure Notes (Signed)
Procedure Name: Intubation Performed by: [place] Pink Pre-anesthesia Checklist: Patient identified, Timeout performed, Emergency Drugs available, Suction available and Patient being monitored Patient Re-evaluated:Patient Re-evaluated prior to inductionOxygen Delivery Method: Circle system utilized Preoxygenation: Pre-oxygenation with 100% oxygen Intubation Type: IV induction Ventilation: Two handed mask ventilation required and Oral airway inserted - appropriate to patient size Laryngoscope Size: Mac and 3 Tube type: Oral Endobronchial tube: Double lumen EBT, Left, EBT position confirmed by auscultation and EBT position confirmed by fiberoptic bronchoscope and 39 Fr Tube size: 8.0 (8.0 oral ETT placed to utilize for exchange of EBT) mm Number of attempts: 2 Airway Equipment and Method: Stylet Placement Confirmation: ETT inserted through vocal cords under direct vision,  positive ETCO2 and breath sounds checked- equal and bilateral Tube secured with: Tape Dental Injury: Injury to lip  Comments: DLx1 Marcelino Scot, SRNA unable to pass double lumen EBT. Mask ventilation resumed. DLx2 8.0 oral ETT placed by Marcelino Scot, SRNA.  Cook exchange catheter utilized to exchange to Double Lumen EBT.  Positive EtCO2. Correct placement verified by Roberts Gaudy, MD using fiberoptic bronchoscope

## 2013-03-15 NOTE — Transfer of Care (Signed)
Immediate Anesthesia Transfer of Care Note  [name]  Procedure(s) Performed: Procedure(s) with comments: MINIMALLY INVASIVE MITRAL VALVE REPAIR (MVR) (Right) MINIMALLY INVASIVE MAZE PROCEDURE (Right) - (R) AXILLARY CANNULATION INTRAOPERATIVE TRANSESOPHAGEAL ECHOCARDIOGRAM (Right)  Patient Location: ICU  Anesthesia Type:General  Level of Consciousness: sedated  Airway & Oxygen Therapy: Patient remains intubated per anesthesia plan and Patient placed on Ventilator (see vital sign flow sheet for setting)  Post-op Assessment: Post -op Vital signs reviewed and stable and report given to ICU RN [id number]  Post vital signs: Reviewed and stable  Complications: No apparent anesthesia complications

## 2013-03-15 NOTE — Op Note (Signed)
CARDIOTHORACIC SURGERY OPERATIVE NOTE  Date of Procedure:  03/15/2013  Preoperative Diagnosis:   Severe Mitral Regurgitation  Persistent Atrial Fibrillation  Postoperative Diagnosis: Same  Procedure:   Minimally-Invasive Mitral Valve Repair  Complex valvuloplasty including triangular resection of flail segment of posterior leaflet  Artificial Gore-tex neocord placement x2  Sorin Memo 3D Ring Annuloplasty   Maze Procedure  Complete bilateral atrial lesion set using cryothermy and bipolar radiofrequency ablation  Oversewing of Left Atrial Appendage  Surgeon: Valentina Gu. 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 States that over the past 4-6 months he has developed exertional shortness of breath, decreased energy, and increased lower extremity swelling. He states that he first developed swelling in his legs last summer after he had knee surgery. However he developed swelling in both legs, not just the left side which was operated on.  Since then he gradually developed exertional shortness of breath and fatigue. He states that occasionally gets short of breath when he tries to lie flat in bed. He has not had any chest pain or chest tightness either with activity or at rest. He denies resting shortness of breath other than when he lays flat in bed. He has not had dizzy spells nor palpitations.  He went to see his primary care physician and was found to be  and persistent atrial fibrillation. He was referred to Dr. Radford Pax for cardiology consultation. An echocardiogram was performed demonstrating mitral valve prolapse with severe mitral regurgitation. Left ventricular size and systolic function appeared normal with ejection fraction 71%. There was mild left atrial enlargement. The patient subsequently underwent transesophageal echocardiogram which confirmed the presence of mitral valve prolapse with a flail segment of the posterior leaflet and severe mitral regurgitation. Cardiac catheterization was performed and notable for the absence of significant coronary artery disease. The patient was referred for possible surgical intervention.  The patient has been seen in consultation and counseled at length regarding the indications, risks and potential benefits of surgery.  All questions have been answered, and the patient provides full informed consent for the operation as described.                 DETAILS OF THE OPERATIVE PROCEDURE  The patient is brought to the operating room on the above mentioned date and central monitoring was established by the anesthesia team including placement of Swan-Ganz catheter through the left internal jugular vein.  A radial arterial line is placed. The patient is placed in the supine position on the operating table.  Intravenous antibiotics are administered. General endotracheal anesthesia is induced uneventfully. The patient is initially intubated using a dual lumen endotracheal tube.  A Foley catheter is placed.  Baseline transesophageal echocardiogram was performed.  Findings were notable for an obvious flail segment (P2) of the posterior leaflet of the mitral valve with severe (4+) mitral regurgitation.  There was mild global LV systolic dysfunction.  The aortic valve was normal.  There was mild tricuspid regurgitation.  A soft roll is placed behind the  patient's left scapula and the neck gently extended and  turned to the left.   The patient's right neck, chest, abdomen, both groins, and both lower extremities are prepared and draped in a sterile manner. A time out procedure is performed.  A right miniature anterolateral thoracotomy incision is performed. The incision is placed just lateral to and superior to the right nipple. The pectoralis major muscle is retracted medially and completely preserved. The right pleural space is entered through the 4th intercostal space. A soft tissue retractor is placed.  Two 11 mm ports are placed through separate stab incisions inferiorly. The right pleural space is insufflated continuously with carbon dioxide gas through the posterior port during the remainder of the operation.  A pledgeted sutures placed through the dome of the right hemidiaphragm and retracted inferiorly to facilitate exposure.  A longitudinal incision is made in the pericardium 3 cm anterior to the phrenic nerve and silk traction sutures are placed on either side of the incision for exposure.  A small incision was made in the right deltopectoral groove 1 cm below the clavicle.  The muscle fibers of the pectoralis major muscle are split longitudinally and the pectoralis fascia incised. Sharp dissection is utilized to retract the pectoralis minor muscle all the way and dissect soft tissues immediately anterior to the right axillary artery. The right axillary artery is exposed and notably soft and free of any atherosclerotic plaque. The patient is heparinized systemically. An angled DeBakey clamps are placed both proximally and distally on the right axillary artery. A small incision is made in the anterior surface the axillary artery and an 8 mm Gore-Tex graft preattached to a cardiopulmonary bypass cannula is sewn in end to side fashion onto the axillary artery to use for arterial access.  The patient is placed in Trendelenburg position. The right internal jugular vein is cannulated with Seldinger technique  and a guidewire advanced into the right atrium. The patient is heparinized systemically. The right internal jugular vein is cannulated with a 14 [place] pediatric femoral venous cannula. The right common femoral vein is cannulated percutaneously with the Seldinger technique and a guidewire is advanced under transesophageal echocardiogram guidance through the right atrium. The femoral vein is cannulated with a long 22 French femoral venous cannula.  Adequate heparinization is verified.      The entire pre-bypass portion of the operation was notable for stable hemodynamics.  Cardiopulmonary bypass was begun.  Vacuum assist venous drainage is utilized. The incision in the pericardium is extended in both directions. Venous drainage and exposure are notably excellent.   A retrograde cardioplegia cannula is placed through the right atrium into the coronary sinus using transesophageal echocardiogram guidance.  An antegrade cardioplegia cannula is placed in the ascending aorta.  The patient is cooled to 28C systemic temperature.  The aortic cross clamp is applied and cold blood cardioplegia is delivered initially in an antegrade fashion through the aortic root.   Supplemental cardioplegia is given retrograde through the coronary sinus catheter. The initial cardioplegic arrest is rapid with early diastolic arrest.  Repeat doses of cardioplegia are administered intermittently every 20 to 30 minutes throughout the entire cross clamp portion of the operation through the aortic root and through the coronary sinus catheter in order to maintain completely flat electrocardiogram.  Myocardial protection was felt to be excellent.  A left atriotomy incision was performed through the interatrial groove and extended partially across the back wall of the left atrium after opening the oblique sinus inferiorly.  The mitral valve is exposed using a self-retaining retractor.  The mitral valve was inspected and notable for classical  fibroelastic deficiency type degenerative disease. There is an obvious flail segment comprising the majority of the middle scallop (P2) of the posterior leaflet with 2 large ruptured primary cords. The remainder of the posterior leaflet was fairly normal as was the anterior leaflet with exception of mild thickening. There is no calcification.  The left atrial lesion set of the Cox cryomaze procedure is now performed using 3 minute duration for all cryothermy lesions.  Initially a lesion is placed along the endocardial surface of the left atrium from the caudad apex of the atriotomy incision across the posterior wall of the left atrium onto the posterior mitral annulus.  A mirror image lesion along the epicardial surface is then performed with the probe posterior to the left atrium, crossing over the coronary sinus.  Two lesions are then performed to create a box isolating all of the pulmonary veins from the remainder of the left atrium.  The first lesion is placed from the cephalad apex of the atriotomy incision across the dome of the left atrium to just anterior to the left sided pulmonary veins.  The second lesion completes the box from the caudad apex of the atriotomy incision across the back wall of the left atrium to connect with the previous lesion just anterior to the left sided pulmonary veins.    Interrupted 2-0 Ethibond horizontal mattress sutures are placed circumferentially around the entire mitral valve annulus. The sutures will ultimately be utilized for ring annuloplasty, and at this juncture there are utilized to suspend the valve symmetrically.  The flail portion of the middle scallop of the posterior leaflet was repaired using a classical triangular resection. Approximately 25% of the total surface area of P2 is removed. A single pledgeted CV 4 Gore-Tex suture is placed through the head of the anterior papillary muscle in horizontal mattress fashion and tied. The 2 limbs of this Gore-Tex  suture will subsequently be utilized for cord replacement.  The intervening vertical defect is subsequently closed using interrupted everting simple CV 5 Gore-Tex sutures.  The valve is tested with saline and appears reasonably competent even prior to ring annuloplasty.  The valve is sized to accept a 96mm annuloplasty ring based upon the distance between the left and right commissures, the height and the surface area of the anterior leaflet.  A Sorin Memo 3D annuloplasty ring (size 20mm, catalog # [id number], serial # [id number]) is implanted uneventfully.  The individual limbs of the Goretex neocords were retrieved from the LV chamber, woven into the posterior leaflet beginning at the free margin where they were placed from the ventricular surface to the atrial surface, and then woven in a diamond shaped fashion towards the posterior mitral annulus.  The Goretex sutures were then tied while the LV was distended with saline so as to adjust the length of the neocords to the appropriate length.  The valve is again tested with saline and appears to be perfectly competent with a broad symmetrical line of coaptation of the anterior and posterior leaflet. There is no residual leak. Rewarming is begun.  The atriotomy was closed using a 2-layer closure of running 3-0 Prolene suture after placing a sump drain across the mitral valve to serve as a left ventricular vent.  One final dose of warm retrograde "hot shot" cardioplegia was administered retrograde through the coronary sinus catheter while all air was evacuated through the aortic root.  The aortic cross clamp was removed after a total cross clamp time of 130 minutes.  The retrograde cardioplegia cannula was removed and the small hole in the right atrium extended a short distance.  The AtriCure bipolar radiofrequency ablation clamp is utilized to complete the majority of the right atrial lesion set of the maze procedure. Initially a longitudinal lesion is placed from  the posterior apex of the atriotomy incision in a cephalad direction to reach the lateral wall of the superior vena cava. A second lesion is placed in the opposite direction extending to the lateral wall of the inferior vena cava. A third lesion is placed directed at a right angle towards the tip of the right atrial appendage. A fourth lesion is placed from the anterior apex of the atriotomy incision towards the acute margin of the heart.  A final right atrial cryothermy lesion was performed by placing the cryothermy probe through the anterior apex of the incision in the right atrium and placing it along the endocardial surface to cross the acute margin to the tricuspid annulus at the 2 o'clock position.  This completes the entire lesion right atrial lesion set of the Cox cryomaze procedure.  The small right atriotomy incision is closed.  Epicardial pacing wires are fixed to the inferior wall of the right ventricule and to the right atrial appendage. The patient is rewarmed to 37C temperature. The left ventricular vent is removed.  The patient is ventilated and flow volumes turndown while the mitral valve repair is inspected using transesophageal echocardiogram. The valve repair appears intact with no residual leak. The antegrade cardioplegia cannula is now removed. The patient is weaned and disconnected from cardiopulmonary bypass.  The patient's rhythm at separation from bypass was AV paced.  The patient was weaned from bypass without any inotropic support. Total cardiopulmonary bypass time for the operation was 184 minutes.  Followup transesophageal echocardiogram performed after separation from bypass revealed a well-seated annuloplasty ring in the mitral position with a normal functioning mitral valve. There was no residual leak.  Left ventricular function was mildly reduced.  Low dose dopamine and milrinone were started.  Protamine was administered to reverse the anticoagulation.  The femoral and right  internal jugular venous cannulae were removed and manual pressure held for 15 minutes.  The side graft on the root right axillary artery was amputated 1 cm from the anastomosis and oversewn with Prolene suture.  Single lung ventilation was begun. The atriotomy closure was inspected for hemostasis. The pericardial sac was drained using a 28 French Bard drain placed through the anterior port incision.  The pericardium was closed using a patch of core matrix bovine submucosal tissue patch. The right pleural space is irrigated with saline solution and inspected for hemostasis. The right pleural space was drained using a 28 French Bard drain placed through the posterior port incision. An additional 9 French straight chest tube was placed to drain the right pleural space do to some torn adhesions along the surface of the right upper lobe.  The miniature thoracotomy incision was closed in multiple layers in routine fashion. The incision for axillary artery cannulation was inspected for hemostasis and closed in multiple layers in routine fashion.  The post-bypass portion of the operation was notable for stable rhythm and hemodynamics.  No blood products were administered during the operation.  The patient tolerated the procedure well.  The patient was reintubated using a single lumen endotracheal tube and subsequently transported to the surgical intensive care unit in  stable condition. There were no intraoperative complications. All sponge instrument and needle counts are verified correct at completion of the operation.    Valentina Gu. Roxy Manns MD 03/15/2013 4:58 PM

## 2013-03-15 NOTE — Brief Op Note (Addendum)
03/15/2013  2:12 PM  PATIENT:  [name]  79 y.o. male  PRE-OPERATIVE DIAGNOSIS:  MITRAL REGURGITATION, ATRIAL FIB  POST-OPERATIVE DIAGNOSIS:  Mitral regurgitation, Atrial fib  PROCEDURE:  Procedure(s): MINIMALLY INVASIVE MITRAL VALVE REPAIR (MVR) #32 MEMO 3-D RING ANNULOPLASTY, TRIANGULAR POSTERIOR LEAFLET RESECTION, AND #2 GORTEX NEOCHORDS. 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

## 2013-03-15 NOTE — Anesthesia Postprocedure Evaluation (Signed)
  Anesthesia Post-op Note  [name]  Procedure(s) Performed: Procedure(s) with comments: MINIMALLY INVASIVE MITRAL VALVE REPAIR (MVR) (Right) MINIMALLY INVASIVE MAZE PROCEDURE (Right) - (R) AXILLARY CANNULATION INTRAOPERATIVE TRANSESOPHAGEAL ECHOCARDIOGRAM (Right)  Patient Location: SICU  Anesthesia Type:General  Level of Consciousness: sedated and Patient remains intubated per anesthesia plan  Airway and Oxygen Therapy: Patient remains intubated per anesthesia plan and Patient placed on Ventilator (see vital sign flow sheet for setting)  Post-op Pain: none  Post-op Assessment: Post-op Vital signs reviewed, Patient's Cardiovascular Status Stable and Respiratory Function Stable  Post-op Vital Signs: stable  Complications: No apparent anesthesia complications

## 2013-03-16 ENCOUNTER — Encounter (HOSPITAL_COMMUNITY): Payer: Self-pay | Admitting: Thoracic Surgery (Cardiothoracic Vascular Surgery)

## 2013-03-16 ENCOUNTER — Inpatient Hospital Stay (HOSPITAL_COMMUNITY): Payer: Medicare Other

## 2013-03-16 LAB — BASIC METABOLIC PANEL
BUN: 22 mg/dL (ref 6–23)
Calcium: 7.6 mg/dL — ABNORMAL LOW (ref 8.4–10.5)
Creatinine, Ser: 1.5 mg/dL — ABNORMAL HIGH (ref 0.50–1.35)
GFR calc Af Amer: 48 mL/min — ABNORMAL LOW (ref >90)
GFR calc non Af Amer: 42 mL/min — ABNORMAL LOW (ref >90)
Potassium: 4.4 mEq/L (ref 3.5–5.1)

## 2013-03-16 LAB — POCT I-STAT, CHEM 8
Chloride: 108 mEq/L (ref 96–112)
Creatinine, Ser: 1.9 mg/dL — ABNORMAL HIGH (ref 0.50–1.35)
Glucose, Bld: 118 mg/dL — ABNORMAL HIGH (ref 70–99)
HCT: 32 % — ABNORMAL LOW (ref 39.0–52.0)
Potassium: 4.5 mEq/L (ref 3.5–5.1)
Sodium: 138 mEq/L (ref 135–145)

## 2013-03-16 LAB — POCT I-STAT 3, ART BLOOD GAS (G3+)
Acid-base deficit: 5 mmol/L — ABNORMAL HIGH (ref 0.0–2.0)
Bicarbonate: 19.7 mEq/L — ABNORMAL LOW (ref 20.0–24.0)
Bicarbonate: 18.6 mEq/L — ABNORMAL LOW (ref 20.0–24.0)
O2 Saturation: 97 %
Patient temperature: 37.4
TCO2: 21 mmol/L (ref 0–100)
TCO2: 20 mmol/L (ref 0–100)
pO2, Arterial: 122 mmHg — ABNORMAL HIGH (ref 80.0–100.0)

## 2013-03-16 LAB — CBC
HCT: 27 % — ABNORMAL LOW (ref 39.0–52.0)
HCT: 31.2 % — ABNORMAL LOW (ref 39.0–52.0)
Hemoglobin: 10.6 g/dL — ABNORMAL LOW (ref 13.0–17.0)
MCH: 29.7 pg (ref 26.0–34.0)
MCH: 29.9 pg (ref 26.0–34.0)
MCHC: 33.7 g/dL (ref 30.0–36.0)
MCHC: 34 g/dL (ref 30.0–36.0)
MCV: 88.2 fL (ref 78.0–100.0)
MCV: 88.1 fL (ref 78.0–100.0)
RDW: 13.6 % (ref 11.5–15.5)

## 2013-03-16 LAB — GLUCOSE, CAPILLARY: Glucose-Capillary: 123 mg/dL — ABNORMAL HIGH (ref 70–99)

## 2013-03-16 LAB — CREATININE, SERUM: GFR calc non Af Amer: 36 mL/min — ABNORMAL LOW (ref >90)

## 2013-03-16 LAB — MAGNESIUM: Magnesium: 2.5 mg/dL (ref 1.5–2.5)

## 2013-03-16 MED ORDER — METOPROLOL TARTRATE 12.5 MG HALF TABLET: 12.5000 mg | ORAL_TABLET | Freq: Once | ORAL | Status: DC

## 2013-03-16 MED ORDER — WARFARIN SODIUM 2.5 MG PO TABS
2.5000 mg | ORAL_TABLET | Freq: Every day | ORAL | Status: DC
  Administered 2013-03-16 – 2013-03-18 (×3): 2.5 mg via ORAL
  Filled 2013-03-16 (×4): qty 1

## 2013-03-16 MED ORDER — INSULIN ASPART 100 UNIT/ML ~~LOC~~ SOLN: 0.0000 [IU] | SUBCUTANEOUS | Status: DC

## 2013-03-16 MED ORDER — FUROSEMIDE 10 MG/ML IJ SOLN
20.0000 mg | Freq: Four times a day (QID) | INTRAMUSCULAR | Status: DC
  Administered 2013-03-16 (×2): 20 mg via INTRAVENOUS

## 2013-03-16 MED ORDER — INSULIN ASPART 100 UNIT/ML ~~LOC~~ SOLN
0.0000 [IU] | SUBCUTANEOUS | Status: DC
  Administered 2013-03-16 (×2): 2 [IU] via SUBCUTANEOUS

## 2013-03-16 MED ORDER — MORPHINE SULFATE 2 MG/ML IJ SOLN: 1.0000 mg | INTRAMUSCULAR | Status: DC | PRN

## 2013-03-16 MED ORDER — WARFARIN - PHYSICIAN DOSING INPATIENT: Freq: Every day | Status: DC

## 2013-03-16 MED FILL — Sodium Chloride Irrigation Soln 0.9%: Qty: 3000 | Status: AC

## 2013-03-16 MED FILL — Magnesium Sulfate Inj 50%: INTRAMUSCULAR | Qty: 2 | Status: AC

## 2013-03-16 MED FILL — Sodium Chloride IV Soln 0.9%: INTRAVENOUS | Qty: 1000 | Status: AC

## 2013-03-16 MED FILL — Heparin Sodium (Porcine) Inj 1000 Unit/ML: INTRAMUSCULAR | Qty: 60 | Status: AC

## 2013-03-16 MED FILL — Lidocaine HCl IV Inj 20 MG/ML: INTRAVENOUS | Qty: 5 | Status: AC

## 2013-03-16 MED FILL — Heparin Sodium (Porcine) Inj 1000 Unit/ML: INTRAMUSCULAR | Qty: 30 | Status: AC

## 2013-03-16 MED FILL — Mannitol IV Soln 20%: INTRAVENOUS | Qty: 500 | Status: AC

## 2013-03-16 MED FILL — Electrolyte-R (PH 7.4) Solution: INTRAVENOUS | Qty: 1000 | Status: AC

## 2013-03-16 MED FILL — Sodium Bicarbonate IV Soln 8.4%: INTRAVENOUS | Qty: 50 | Status: AC

## 2013-03-16 MED FILL — Potassium Chloride Inj 2 mEq/ML: INTRAVENOUS | Qty: 40 | Status: AC

## 2013-03-16 NOTE — Clinical Documentation Improvement (Signed)
CHF DOCUMENTATION CLARIFICATION QUERY  THIS DOCUMENT IS NOT A PERMANENT PART OF THE MEDICAL RECORD  TO RESPOND TO THE THIS QUERY, FOLLOW THE INSTRUCTIONS BELOW:  1. If needed, update documentation for the patient's encounter via the notes activity.  2. Access this query again and click edit on the [employer].  3. After updating, or not, click F2 to complete all highlighted (required) fields concerning your review. Select "additional documentation in the medical record" OR "no additional documentation provided".  4. Click Sign note button.  5. The deficiency will fall out of your In Basket *Please let us know if you are not able to complete this workflow by phone or e-mail (listed below).  Please update your documentation within the medical record to reflect your response to this query.                                                                                    03/16/13  Dear [facility],  In a better effort to capture your patient's severity of illness, reflect appropriate length of stay and utilization of resources, a review of the patient medical record has revealed the following indicators the diagnosis of Heart Failure.    Based on your clinical judgment, please clarify and document in a progress note and/or discharge summary the clinical condition associated with the following supporting information:  In responding to this query please exercise your independent judgment.  The fact that a query is asked, does not imply that any particular answer is desired or expected.   Possible Clinical Conditions?   Chronic Diastolic Congestive Heart Failure  Acute Diastolic Congestive Heart Failure  Acute on Chronic Diastolic Congestive Heart Failure  Other Condition  Cannot Clinically Determine    Supporting Information:  Risk Factors: CHF noted per 4/20 H&P. Acute Diastolic CHF noted per problem list, 3/04, previous hospital stay.  Treatment: Lasix 20mg   po x2 tabs daily noted on home medication list. 4/24:  Lasix 20mg  IV q6hr.    Reviewed: additional documentation in the medical record  Thank You,  Theron Arista,  Clinical Documentation Specialist:  Phone: [place]

## 2013-03-16 NOTE — Progress Notes (Signed)
T. CTS p.m. Rounds  Postop day 1 mitral valve repair and maze procedure Ambulating in the hallway Stable cardiac rhythm atrially paced P.m. labs reviewed and are satisfactory Patient comfortable and doing well

## 2013-03-16 NOTE — Progress Notes (Addendum)
   CARDIOTHORACIC SURGERY PROGRESS NOTE   R1 Day Post-Op Procedure(s) (LRB): MINIMALLY INVASIVE MITRAL VALVE REPAIR (MVR) (Right) MINIMALLY INVASIVE MAZE PROCEDURE (Right) INTRAOPERATIVE TRANSESOPHAGEAL ECHOCARDIOGRAM (Right)  Subjective: Looks good.  Mild soreness in chest.  Objective: Vital signs: BP Readings from Last 1 Encounters:  03/16/13 109/58   Pulse Readings from Last 1 Encounters:  03/16/13 80   Resp Readings from Last 1 Encounters:  03/16/13 23   Temp Readings from Last 1 Encounters:  03/16/13 99.5 F (37.5 C)     Hemodynamics: PAP: (36-51)/(18-28) 50/27 mmHg CO:  [3.5 L/min-5.2 L/min] 5.2 L/min CI:  [1.8 L/min/m2-2.7 L/min/m2] 2.7 L/min/m2  Physical Exam:  Rhythm:   Junctional 40's, currently AAI pacing  Breath sounds: clear  Heart sounds:  RRR w/out murmur  Incisions:  Dressing dry  Abdomen:  soft  Extremities:  warm   Intake/Output from previous day: 04/23 0701 - 04/24 0700 In: 18571.5 [I.V.:16421.5; Blood:720; NG/GT:30; IV Piggyback:1400] Out: 2985 [Urine:1335; Emesis/NG output:50; Blood:800; Chest Tube:800] Intake/Output this shift:    Lab Results:  Recent Labs  03/15/13 2255 03/15/13 2258 03/16/13 0500  WBC 11.9*  --  10.5  HGB 11.1* 10.9* 9.1*  HCT 33.0* 32.0* 27.0*  PLT 103*  --  98*   BMET:  Recent Labs  03/13/13 1318  03/15/13 2258 03/16/13 0500  NA 137  < > 140 137  K 4.0  < > 5.0 4.4  CL 100  --  111 107  CO2 26  --   --  21  GLUCOSE 113*  < > 103* 136*  BUN 25*  --  21 22  CREATININE 1.76*  < > 1.50* 1.50*  CALCIUM 9.3  --   --  7.6*  < > = values in this interval not displayed.  CBG (last 3)   Recent Labs  03/15/13 2203 03/15/13 2346 03/16/13 0404  GLUCAP 104* 98 122*   ABG    Component Value Date/Time   PHART 7.333* 03/16/2013 0406   HCO3 18.6* 03/16/2013 0406   TCO2 20 03/16/2013 0406   ACIDBASEDEF 7.0* 03/16/2013 0406   O2SAT 97.0 03/16/2013 0406   CXR: Looks good  Assessment/Plan: S/P  Procedure(s) (LRB): MINIMALLY INVASIVE MITRAL VALVE REPAIR (MVR) (Right) MINIMALLY INVASIVE MAZE PROCEDURE (Right) INTRAOPERATIVE TRANSESOPHAGEAL ECHOCARDIOGRAM (Right)  Doing well POD1 Stable hemodynamics, AAI pacing on low dose Neo and milrinone Expected post op acute blood loss anemia, mild, stable Expected post op volume excess, mild   Mobilize  Diuresis  Wean drips  D/C large anterior tube but leave remaining chest tubes in place  Resume coumadin   [name],[name] 03/16/2013 8:11 AM

## 2013-03-16 NOTE — Progress Notes (Signed)
Utilization Review Completed. 03/16/2013

## 2013-03-16 NOTE — Procedures (Signed)
Extubation Procedure Note  Patient Details:   Name: [name] DOB: 19-Dec-1928 MRN: [id number]   Airway Documentation:     Evaluation  O2 sats: stable throughout Complications: No apparent complications Patient did tolerate procedure well. Bilateral Breath Sounds: Clear   Yes  Patient NIF -35 VC 1550 ml.  Patient was able to breath around a deflated cuff.  RT suctioned down ETT and orally before extubation.  Post extubation patient was able to cough up secretions and state name.  BBS are clear with no stridor noted at this time.  Patient seems to be tolerating extubation well at this time RT will continue to monitor.  Marilynne Halsted 03/16/2013, 3:16 AM

## 2013-03-16 NOTE — Plan of Care (Signed)
Problem: Phase II Progression Outcomes Goal: Patient extubated within - Outcome: Completed/Met Date Met:  03/16/13 6 to 12 hours, just slow to wake up from anesthesia

## 2013-03-17 ENCOUNTER — Inpatient Hospital Stay (HOSPITAL_COMMUNITY): Payer: Medicare Other

## 2013-03-17 LAB — CBC
HCT: 29.3 % — ABNORMAL LOW (ref 39.0–52.0)
MCH: 30.1 pg (ref 26.0–34.0)
MCV: 88.3 fL (ref 78.0–100.0)
Platelets: 89 10*3/uL — ABNORMAL LOW (ref 150–400)
RDW: 14.2 % (ref 11.5–15.5)

## 2013-03-17 LAB — GLUCOSE, CAPILLARY
Glucose-Capillary: 106 mg/dL — ABNORMAL HIGH (ref 70–99)
Glucose-Capillary: 98 mg/dL (ref 70–99)

## 2013-03-17 LAB — BASIC METABOLIC PANEL
BUN: 25 mg/dL — ABNORMAL HIGH (ref 6–23)
CO2: 25 mEq/L (ref 19–32)
Calcium: 8.1 mg/dL — ABNORMAL LOW (ref 8.4–10.5)
Creatinine, Ser: 1.71 mg/dL — ABNORMAL HIGH (ref 0.50–1.35)
Glucose, Bld: 110 mg/dL — ABNORMAL HIGH (ref 70–99)

## 2013-03-17 MED ORDER — MOVING RIGHT ALONG BOOK
Freq: Once | Status: AC
  Administered 2013-03-17: 09:00:00
  Filled 2013-03-17: qty 1

## 2013-03-17 MED ORDER — SODIUM CHLORIDE 0.9 % IJ SOLN
3.0000 mL | Freq: Two times a day (BID) | INTRAMUSCULAR | Status: DC
  Administered 2013-03-17 – 2013-03-20 (×8): 3 mL via INTRAVENOUS

## 2013-03-17 MED ORDER — ASPIRIN 81 MG PO CHEW
CHEWABLE_TABLET | ORAL | Status: AC
  Filled 2013-03-17: qty 1

## 2013-03-17 MED ORDER — POTASSIUM CHLORIDE CRYS ER 20 MEQ PO TBCR
20.0000 meq | EXTENDED_RELEASE_TABLET | Freq: Two times a day (BID) | ORAL | Status: DC
  Administered 2013-03-18 – 2013-03-21 (×7): 20 meq via ORAL
  Filled 2013-03-17 (×9): qty 1

## 2013-03-17 MED ORDER — FUROSEMIDE 40 MG PO TABS
40.0000 mg | ORAL_TABLET | Freq: Two times a day (BID) | ORAL | Status: DC
  Administered 2013-03-18 – 2013-03-19 (×3): 40 mg via ORAL
  Filled 2013-03-17 (×5): qty 1

## 2013-03-17 MED ORDER — SODIUM CHLORIDE 0.9 % IV SOLN
250.0000 mL | INTRAVENOUS | Status: DC | PRN
Start: 2013-03-17 — End: 2013-03-21

## 2013-03-17 MED ORDER — TRAMADOL HCL 50 MG PO TABS
50.0000 mg | ORAL_TABLET | ORAL | Status: DC | PRN
  Administered 2013-03-20: 100 mg via ORAL
  Filled 2013-03-17: qty 2

## 2013-03-17 MED ORDER — SODIUM CHLORIDE 0.9 % IJ SOLN
3.0000 mL | INTRAMUSCULAR | Status: DC | PRN
  Administered 2013-03-17: 3 mL via INTRAVENOUS

## 2013-03-17 MED ORDER — ASPIRIN EC 81 MG PO TBEC
81.0000 mg | DELAYED_RELEASE_TABLET | Freq: Every day | ORAL | Status: DC
  Administered 2013-03-17 – 2013-03-21 (×5): 81 mg via ORAL
  Filled 2013-03-17 (×5): qty 1

## 2013-03-17 MED ORDER — FUROSEMIDE 10 MG/ML IJ SOLN
20.0000 mg | Freq: Four times a day (QID) | INTRAMUSCULAR | Status: AC
  Administered 2013-03-17 (×3): 20 mg via INTRAVENOUS
  Filled 2013-03-17 (×3): qty 2

## 2013-03-17 NOTE — Progress Notes (Addendum)
[address]            [place] [address]          [phone]      2 Days Post-Op Procedure(s) (LRB): MINIMALLY INVASIVE MITRAL VALVE REPAIR (MVR) (Right) MINIMALLY INVASIVE MAZE PROCEDURE (Right) INTRAOPERATIVE TRANSESOPHAGEAL ECHOCARDIOGRAM (Right)  Subjective: Patient had a "rough night". He did not sleep well. Has had incisional pain as well.  Objective: Vital signs in last 24 hours: Temp:  [98.1 F (36.7 C)-99.3 F (37.4 C)] 98.3 F (36.8 C) (04/25 0749) Pulse Rate:  [79-137] 80 (04/25 0700) Cardiac Rhythm:  [-] Atrial paced (04/25 0735) Resp:  [14-26] 26 (04/25 0700) BP: (98-129)/(53-99) 129/99 mmHg (04/25 0700) SpO2:  [90 %-100 %] 97 % (04/25 0700) Arterial Line BP: (83-132)/(33-63) 130/56 mmHg (04/25 0700) Weight:  [80.4 kg (177 lb 4 oz)] 80.4 kg (177 lb 4 oz) (04/25 0651)  Pre op weight 78 kg Current Weight  03/17/13 80.4 kg (177 lb 4 oz)    Hemodynamic parameters for last 24 hours: PAP: (45-51)/(13-23) 49/16 mmHg  Intake/Output from previous day: 04/24 0701 - 04/25 0700 In: 997.7 [I.V.:897.7; IV Piggyback:100] Out: 2710 [Urine:1820; Chest Tube:890]   Physical Exam:  Cardiovascular: RRR, rub with chest tubes in place Pulmonary: Clear to auscultation bilaterally; no rales, wheezes, or rhonchi. Abdomen: Soft, non tender, bowel sounds present. Extremities: Mild bilateral lower extremity edema. Wounds: Clean and dry.  No erythema or signs of infection.  Lab Results: CBC: Recent Labs  03/16/13 1740 03/16/13 1749 03/17/13 0417  WBC 14.6*  --  11.2*  HGB 10.6* 10.9* 10.0*  HCT 31.2* 32.0* 29.3*  PLT 101*  --  89*   BMET:  Recent Labs  03/16/13 0500  03/16/13 1749 03/17/13 0417  NA 137  --  138 134*  K 4.4  --  4.5 4.4  CL 107  --  108 103  CO2 21  --   --  25  GLUCOSE 136*  --  118* 110*  BUN 22  --  23 25*  CREATININE 1.50*  < > 1.90* 1.71*  CALCIUM 7.6*  --   --  8.1*  < > = values in this interval  not displayed.  PT/INR:  Lab Results  Component Value Date   INR 1.77* 03/17/2013   INR 1.78* 03/15/2013   INR 1.30 03/15/2013   ABG:  INR: Will add last result for INR, ABG once components are confirmed Will add last 4 CBG results once components are confirmed  Assessment/Plan:  1. CV - AAI paced. On Dopamine gttp at 1.4 and Coumadin. Wean Dopamine as able. 2.  Pulmonary - Chest tubes with 1090 cc of output last 24 hours.CXR this am appears to show no pneumothorax, cardiomegaly, small left pleural effusion. 3.  Acute blood loss anemia - H and H stable at 10 and 29.3 4.Thrombocytopenia-platelets 89,000. On ecasa 325. Will decrease to 81 as is on Coumadin 5.Creatinine decreased from 1.77 yo 1.7 6.CBGs 117/106/95. Pre op HGA1C 5.7. Likely pre diabetic. Will need further surveillance as an outpatient 7.Remove a line when off Dopamine  ZIMMERMAN,DONIELLE MPA-C 03/17/2013,8:03 AM   I have seen and examined the patient and agree with the assessment and plan as outlined.  Doing well POD2.  Now stable off all drips.  Renal function stable.  Sinus brady and junctional rhythm under pacer w/ HR 40's.  Will continue  AAI pacing.  Restart low dose amiodarone in 2-3 days if rhythm recovers but hold beta blockers.  Keep chest tubes at least 1 more day.  Mobilize.  Diuresis.  No lovenox/heparin and watch platelet count.  Transfer step down.  [name],[name] 03/17/2013 9:03 AM

## 2013-03-17 NOTE — Progress Notes (Signed)
Patient ID: [name], male   DOB: Dec 16, 1928, 79 y.o.   MRN: [id number]  SICU Evening Rounds:  Stable day. Awaiting bed in PTCU.

## 2013-03-17 NOTE — Progress Notes (Signed)
Placed patient on CPAP via auto-mode with maximum pressure set at 20cm and minimum pressure set at 4cm. SP02=96%

## 2013-03-18 ENCOUNTER — Inpatient Hospital Stay (HOSPITAL_COMMUNITY): Payer: Medicare Other

## 2013-03-18 LAB — TYPE AND SCREEN
ABO/RH(D): O POS
Antibody Screen: NEGATIVE
Unit division: 0
Unit division: 0
Unit division: 0
Unit division: 0
Unit division: 0

## 2013-03-18 LAB — CBC
HCT: 28.9 % — ABNORMAL LOW (ref 39.0–52.0)
Hemoglobin: 9.8 g/dL — ABNORMAL LOW (ref 13.0–17.0)
MCH: 30.2 pg (ref 26.0–34.0)
MCV: 88.9 fL (ref 78.0–100.0)
RBC: 3.25 MIL/uL — ABNORMAL LOW (ref 4.22–5.81)

## 2013-03-18 LAB — BASIC METABOLIC PANEL
CO2: 26 mEq/L (ref 19–32)
Chloride: 101 mEq/L (ref 96–112)
Glucose, Bld: 98 mg/dL (ref 70–99)
Potassium: 4.3 mEq/L (ref 3.5–5.1)
Sodium: 135 mEq/L (ref 135–145)

## 2013-03-18 MED ORDER — SODIUM CHLORIDE 0.9 % IJ SOLN
INTRAMUSCULAR | Status: AC
  Administered 2013-03-18: 10 mL
  Filled 2013-03-18: qty 20

## 2013-03-18 NOTE — Plan of Care (Signed)
Problem: Phase III Progression Outcomes Goal: Time patient transferred to PCTU/Telemetry POD Outcome: Completed/Met Date Met:  03/18/13 1420 Transferred to 2015 via wheelchair. Portable monitor on.  No changes.

## 2013-03-18 NOTE — Progress Notes (Signed)
Received pt from 2300. Pt is stable and sitting up in chair. Pt has walked once with RN from 2300 this morning. Will encourage more walks today.

## 2013-03-18 NOTE — Progress Notes (Signed)
3 Days Post-Op Procedure(s) (LRB): MINIMALLY INVASIVE MITRAL VALVE REPAIR (MVR) (Right) MINIMALLY INVASIVE MAZE PROCEDURE (Right) INTRAOPERATIVE TRANSESOPHAGEAL ECHOCARDIOGRAM (Right) Subjective: No complaints   Objective: Vital signs in last 24 hours: Temp:  [97.6 F (36.4 C)-98.4 F (36.9 C)] 97.8 F (36.6 C) (04/26 1156) Pulse Rate:  [79-80] 79 (04/26 1300) Cardiac Rhythm:  [-] Atrial paced (04/26 0800) Resp:  [13-24] 24 (04/26 1300) BP: (101-136)/(54-74) 101/68 mmHg (04/26 1300) SpO2:  [92 %-97 %] 96 % (04/26 1300) Weight:  [79.2 kg (174 lb 9.7 oz)] 79.2 kg (174 lb 9.7 oz) (04/26 0600)  Hemodynamic parameters for last 24 hours:    Intake/Output from previous day: 04/25 0701 - 04/26 0700 In: [place] [P.O.:1500; I.V.:76; IV Piggyback:100] Out: 2645 [Urine:2145; Chest Tube:500] Intake/Output this shift: Total I/O In: 240 [P.O.:240] Out: 350 [Urine:350]  General appearance: alert and cooperative Neurologic: intact Heart: regular rate and rhythm, S1, S2 normal, no murmur, click, rub or gallop Lungs: clear to auscultation bilaterally Wound: incisions ok   Lab Results:  Recent Labs  03/17/13 0417 03/18/13 0500  WBC 11.2* 8.5  HGB 10.0* 9.8*  HCT 29.3* 28.9*  PLT 89* 103*   BMET:  Recent Labs  03/17/13 0417 03/18/13 0500  NA 134* 135  K 4.4 4.3  CL 103 101  CO2 25 26  GLUCOSE 110* 98  BUN 25* 29*  CREATININE 1.71* 1.72*  CALCIUM 8.1* 8.1*    PT/INR:  Recent Labs  03/18/13 0500  LABPROT 19.2*  INR 1.68*   ABG    Component Value Date/Time   PHART 7.333* 03/16/2013 0406   HCO3 18.6* 03/16/2013 0406   TCO2 21 03/16/2013 1749   ACIDBASEDEF 7.0* 03/16/2013 0406   O2SAT 97.0 03/16/2013 0406   CBG (last 3)   Recent Labs  03/16/13 2346 03/17/13 0345 03/17/13 0747  GLUCAP 106* 95 98   *RADIOLOGY REPORT*   Clinical Data: Evaluate chest tube   PORTABLE CHEST - 1 VIEW   Comparison: 03/17/2013   Findings: Right-sided chest tubes are again noted  and a.  Unchanged in position from previous exam.  Persistent right apical pneumothorax.   Loculated fluid overlying the right midlung and free flowing appearing left pleural effusion are unchanged.   Persistent consolidation in the left base.  the right midlung   IMPRESSION:   1.  Right-sided chest tubes are stable in position.  Persistent small right apical pneumothorax. 2.  No change in bilateral effusions.     Original Report Authenticated By: Kerby Moors, M.D.  Assessment/Plan: S/P Procedure(s) (LRB): MINIMALLY INVASIVE MITRAL VALVE REPAIR (MVR) (Right) MINIMALLY INVASIVE MAZE PROCEDURE (Right) INTRAOPERATIVE TRANSESOPHAGEAL ECHOCARDIOGRAM (Right) Hemodynamically stable Rhythm under pacer looks junctional in 50's. He A-paces well. Continue anticoagulation Chronic Kidney Disease: creat stable. Awaiting bed on 2000.   LOS: 3 days    Gilford Raid K 03/18/2013

## 2013-03-18 NOTE — Progress Notes (Signed)
Pt ambulated for second time today with walker approx. 400 feet. Pt denied SOB or dizziness. Pt tolerated well. Pt back in bed with call bell in reach.

## 2013-03-19 ENCOUNTER — Encounter: Payer: Self-pay | Admitting: Anesthesiology

## 2013-03-19 LAB — BASIC METABOLIC PANEL
BUN: 26 mg/dL — ABNORMAL HIGH (ref 6–23)
Chloride: 101 mEq/L (ref 96–112)
GFR calc Af Amer: 43 mL/min — ABNORMAL LOW (ref >90)
Potassium: 3.8 mEq/L (ref 3.5–5.1)

## 2013-03-19 LAB — PROTIME-INR: Prothrombin Time: 22 seconds — ABNORMAL HIGH (ref 11.6–15.2)

## 2013-03-19 MED ORDER — WARFARIN SODIUM 1 MG PO TABS
1.0000 mg | ORAL_TABLET | Freq: Once | ORAL | Status: AC
  Administered 2013-03-19: 1 mg via ORAL
  Filled 2013-03-19: qty 1

## 2013-03-19 MED ORDER — WARFARIN SODIUM 2.5 MG PO TABS
2.5000 mg | ORAL_TABLET | Freq: Every day | ORAL | Status: DC
  Administered 2013-03-20: 2.5 mg via ORAL
  Filled 2013-03-19 (×2): qty 1

## 2013-03-19 MED ORDER — FUROSEMIDE 40 MG PO TABS
40.0000 mg | ORAL_TABLET | Freq: Every day | ORAL | Status: DC
  Administered 2013-03-20 – 2013-03-21 (×2): 40 mg via ORAL
  Filled 2013-03-19 (×2): qty 1

## 2013-03-19 MED ORDER — POTASSIUM CHLORIDE CRYS ER 20 MEQ PO TBCR
20.0000 meq | EXTENDED_RELEASE_TABLET | Freq: Once | ORAL | Status: AC
  Administered 2013-03-19: 20 meq via ORAL

## 2013-03-19 MED ORDER — LACTULOSE 10 GM/15ML PO SOLN
20.0000 g | Freq: Once | ORAL | Status: DC
  Filled 2013-03-19: qty 30

## 2013-03-19 NOTE — Progress Notes (Signed)
Walked 350' last pm

## 2013-03-19 NOTE — Progress Notes (Signed)
Anesthesiology Follow-up: Alert, neuro intact, moved to 2015 yesterday from 2300.   VS: T-36.7 BP 114/63 HR 70 (a-paced) RR 18 O2 Sat 95% on RA using CPAP at night  H/H 9.8/28.9 (4/26) K-4.3 BUN/Cr. 29/1.72 glucose 98  Extubated 10 hours post-op  4 days S/P minimally invasive MV reoair and MAZE procedure, still requiring atrial pacing, stable renal function, making good progress overall.  Roberts Gaudy

## 2013-03-19 NOTE — Progress Notes (Addendum)
                   [address]            [place] [address]          [phone]      4 Days Post-Op Procedure(s) (LRB): MINIMALLY INVASIVE MITRAL VALVE REPAIR (MVR) (Right) MINIMALLY INVASIVE MAZE PROCEDURE (Right) INTRAOPERATIVE TRANSESOPHAGEAL ECHOCARDIOGRAM (Right)  Subjective: Patient sitting in chair. His only complaint is some incisional tenderness  Objective: Vital signs in last 24 hours: Temp:  [97.8 F (36.6 C)-98.4 F (36.9 C)] 98 F (36.7 C) (04/27 0428) Pulse Rate:  [70-80] 70 (04/27 0428) Cardiac Rhythm:  [-] Atrial paced (04/26 2000) Resp:  [14-24] 18 (04/27 0428) BP: (101-150)/(58-68) 114/63 mmHg (04/27 0428) SpO2:  [93 %-98 %] 95 % (04/27 0428) Weight:  [76.8 kg (169 lb 5 oz)-79.379 kg (175 lb)] 76.8 kg (169 lb 5 oz) (04/27 0428)  Pre op weight  78 kg Current Weight  03/19/13 76.8 kg (169 lb 5 oz)      Intake/Output from previous day: 04/26 0701 - 04/27 0700 In: 440 [P.O.:440] Out: 2145 [Urine:1900; Chest Tube:245]   Physical Exam:  Cardiovascular: RRR Pulmonary: Clear to auscultation bilaterally; no rales, wheezes, or rhonchi. Abdomen: Soft, non tender, bowel sounds present. Extremities: Mild bilateral lower extremity edema. Wounds: Clean and dry.  No erythema or signs of infection.  Lab Results: CBC: Recent Labs  03/17/13 0417 03/18/13 0500  WBC 11.2* 8.5  HGB 10.0* 9.8*  HCT 29.3* 28.9*  PLT 89* 103*   BMET:  Recent Labs  03/18/13 0500 03/19/13 0500  NA 135 137  K 4.3 3.8  CL 101 101  CO2 26 30  GLUCOSE 98 103*  BUN 29* 26*  CREATININE 1.72* 1.64*  CALCIUM 8.1* 8.4    PT/INR:  Lab Results  Component Value Date   INR 2.01* 03/19/2013   INR 1.68* 03/18/2013   INR 1.77* 03/17/2013   ABG:  INR: Will add last result for INR, ABG once components are confirmed Will add last 4 CBG results once components are confirmed  Assessment/Plan:  1. CV - Junctional. Remains a paced. On Coumadin. INR increased  from 1.68 to 2.01 so will give 1 mg tonight. 2.  Pulmonary - Chest tubes with 245 cc of output last 24 hours. Chest tubes to remain for now.  3. Volume Overload - On Lasix 40 po bid. Is below pre op weight. May be able to decrease to daily 4.  Acute blood loss anemia - Last H and H 9.8 and 28.9 5. Creatinine decreased from 1.72 to 1.64 (baseline 1.4-1.7) 6. Gently supplement potassium 7.Thrombocytopenia-last platelet count up to 103,000 8.LOC constipation  ZIMMERMAN,DONIELLE MPA-C 03/19/2013,7:50 AM   Chart reviewed, patient examined, agree with above.

## 2013-03-19 NOTE — Progress Notes (Signed)
Pt walked with rolling walker 1000' for 3rd walk today.

## 2013-03-19 NOTE — Progress Notes (Signed)
Pt stated he preferred not to wear CPAP tonight.  Pt sleeping on RA.

## 2013-03-19 NOTE — Progress Notes (Signed)
Pt ambulated approx. 500 feet with rolling walker. Pt tolerated very well, and back to chair with call bell in reach.

## 2013-03-19 NOTE — Progress Notes (Signed)
Pt ambulated for the second time today approx. 500 feet with rolling walker. Pt tolerated very well.

## 2013-03-20 ENCOUNTER — Inpatient Hospital Stay (HOSPITAL_COMMUNITY): Payer: Medicare Other

## 2013-03-20 ENCOUNTER — Ambulatory Visit: Payer: Medicare Other | Admitting: Thoracic Surgery (Cardiothoracic Vascular Surgery)

## 2013-03-20 ENCOUNTER — Encounter (HOSPITAL_COMMUNITY): Payer: Self-pay | Admitting: Thoracic Surgery (Cardiothoracic Vascular Surgery)

## 2013-03-20 DIAGNOSIS — I5032 Chronic diastolic (congestive) heart failure: Secondary | ICD-10-CM

## 2013-03-20 HISTORY — DX: Chronic diastolic (congestive) heart failure: I50.32

## 2013-03-20 LAB — CBC
MCV: 88.5 fL (ref 78.0–100.0)
Platelets: 170 10*3/uL (ref 150–400)
RBC: 3.38 MIL/uL — ABNORMAL LOW (ref 4.22–5.81)
WBC: 7.2 10*3/uL (ref 4.0–10.5)

## 2013-03-20 MED ORDER — AMIODARONE HCL 200 MG PO TABS
200.0000 mg | ORAL_TABLET | Freq: Every day | ORAL | Status: DC
  Administered 2013-03-20 – 2013-03-21 (×2): 200 mg via ORAL
  Filled 2013-03-20 (×2): qty 1

## 2013-03-20 NOTE — Progress Notes (Signed)
Pt. Refuses CPAP at this time. Pt. Was made aware to call RT if he changed his mind anytime during the night & decided to wear CPAP.  

## 2013-03-20 NOTE — Progress Notes (Addendum)
                    [address]            [place] [address]          [phone]     5 Days Post-Op Procedure(s) (LRB): MINIMALLY INVASIVE MITRAL VALVE REPAIR (MVR) (Right) MINIMALLY INVASIVE MAZE PROCEDURE (Right) INTRAOPERATIVE TRANSESOPHAGEAL ECHOCARDIOGRAM (Right)  Subjective: A little sore at CT site, but otherwise doing well.  Walking in halls without trouble, appetite good.   Objective: Vital signs in last 24 hours: Patient Vitals for the past 24 hrs:  BP Temp Temp src Pulse Resp SpO2 Weight  03/20/13 0459 135/66 mmHg 98.5 F (36.9 C) Oral 74 20 94 % 163 lb 2.3 oz (74 kg)  03/19/13 1918 116/64 mmHg 98.6 F (37 C) Oral 70 18 93 % -  03/19/13 1418 120/78 mmHg 97.8 F (36.6 C) Oral 70 18 95 % -   Current Weight  03/20/13 163 lb 2.3 oz (74 kg)  Pre op weight 78 kg    Intake/Output from previous day: 04/27 0701 - 04/28 0700 In: 240 [P.O.:240] Out: 1775 [Urine:1475; Chest Tube:300]    PHYSICAL EXAM:  Heart: RRR, paced at 70 Lungs: clear Wound: clean and dry Extremities: No LE edema    Lab Results: CBC: Recent Labs  03/18/13 0500 03/20/13 0600  WBC 8.5 7.2  HGB 9.8* 10.2*  HCT 28.9* 29.9*  PLT 103* 170   BMET:  Recent Labs  03/18/13 0500 03/19/13 0500  NA 135 137  K 4.3 3.8  CL 101 101  CO2 26 30  GLUCOSE 98 103*  BUN 29* 26*  CREATININE 1.72* 1.64*  CALCIUM 8.1* 8.4    PT/INR:  Recent Labs  03/20/13 0600  LABPROT 22.7*  INR 2.10*      Assessment/Plan: S/P Procedure(s) (LRB): MINIMALLY INVASIVE MITRAL VALVE REPAIR (MVR) (Right) MINIMALLY INVASIVE MAZE PROCEDURE (Right) INTRAOPERATIVE TRANSESOPHAGEAL ECHOCARDIOGRAM (Right)  CV- I turned pacer down to 30,and he is running sinus rhythm/junctional in 70s with a few random pacer spikes.  May be able to turn down or d/c pacer.  Continue to hold beta blocker for now. Continue low dose Coumadin.  Chronic renal insufficiency- Cr has been stable.  Labs for this am  not back yet.  CT output still high (130 ml overnight). Leave CT for now.   Vol overload- diurese.  CRPI, pulm toilet.   LOS: 5 days    COLLINS,GINA H 03/20/2013  I have seen and examined the patient and agree with the assessment and plan as outlined.  Rhythm stable.  D/C pacing wires and chest tubes.  Possible d/c home tomorrow.  Will restart low dose amiodarone but continue to hold beta blocker.  Recheck INR in am tomorrow to decide on coumadin dose.  [name],[name] 03/20/2013 9:16 AM

## 2013-03-20 NOTE — Progress Notes (Signed)
Right chest tubes x 2 DCd per MD order and protocol.  Vasoline gauze dressing applied, sutures secured tightly.  Pt tolerated well, instructed to remain in bed x 30 min, STAT PCXR ordered at bedside.  Will continue to monitor closely. Renee Pain

## 2013-03-20 NOTE — Progress Notes (Signed)
CARDIAC REHAB PHASE I   PRE:  Rate/Rhythm: 69SR  BP:  Supine:   Sitting: 130/82  Standing:    SaO2: 94%RA  MODE:  Ambulation: 890 ft   POST:  Rate/Rhythm: 71SR  BP:  Supine:   Sitting: 130/70  Standing:    SaO2: 94%RA 1452-1520 Assisted to bathroom and then pt walked 890 ft on RA with rolling walker with steady gait. To recliner with call bell after walk. Pt could not give me a definite answer to whether he wants a rolling walker for home or not. Tolerated walk well. Will need to address walker issue prior to d/c.   Graylon Good, RN BSN  03/20/2013 3:17 PM

## 2013-03-20 NOTE — Care Management Note (Signed)
    Page 1 of 1   03/21/2013     4:25:08 PM   CARE MANAGEMENT NOTE 03/21/2013  Patient:  [name], [name]   Account Number:  [id number]  Date Initiated:  03/20/2013  Documentation initiated by:  Nava Song  Subjective/Objective Assessment:   PT S/P MINI MVR AND MAZE ON 03/15/13.  PTA, PT INDEPENDENT, LIVES WITH SPOUSE.     Action/Plan:   MET WITH PT TO DISCUSS DC PLANS.  WIFE TO PROVIDE 24HR CARE AT DC.  WILL FOLLOW FOR HOME NEEDS AS PT PROGRESSES.   Anticipated DC Date:  03/21/2013   Anticipated DC Plan:  [place]  CM consult      Choice offered to / List presented to:     DME arranged  Vassie Moselle      DME agency  [facility].        Status of service:  Completed, signed off Medicare Important Message given?   (If response is "NO", the following Medicare IM given date fields will be blank) Date Medicare IM given:   Date Additional Medicare IM given:    Discharge Disposition:    Per UR Regulation:  Reviewed for med. necessity/level of care/duration of stay  If discussed at [place] of Stay Meetings, dates discussed:    CommentsAntoine Primas [id number] 03/21/13 REFERRAL TO [facility] FOR DME NEEDS.

## 2013-03-20 NOTE — Progress Notes (Signed)
EPW dc'd per MD order and protocol.  Resistance met, Gina PA paged to assist, wires DC'd successfully without bleeding or ectopy noted.  Wire ends intact.  Pt tolerated fairly well.  Frequent vital signs initiated, pt instructed to remain in bed x 1 hr.  Will continue to monitor closely. [name]

## 2013-03-20 NOTE — Discharge Summary (Signed)
[address]            [place] [address]          [phone]         Discharge Summary  Name: [name] DOB: [birth year] 79 y.o. MRN: [id number]   Admission Date: 03/15/2013 Discharge Date: 03/21/2013    Admitting Diagnosis: Mitral valve prolapse Severe mitral regurgitation   Discharge Diagnosis:  Mitral valve prolapse Severe mitral regurgitation Expected postoperative blood loss anemia Acute on chronic renal insufficiency   Past Medical History  Diagnosis Date  . Heart palpitations     PT STATES NOT REALLY A BIG PROBLEM--DENIES CHEST PAINS  . Insomnia   . BPH (benign prostatic hypertrophy)   . Constipation   . History of torn meniscus of left knee     PAINFUL LEFT KNEE  . Hearing loss     BILATERAL - WEARS HEARING AIDS  . Hyperlipidemia   . Mitral valve prolapse   . Mitral valve regurgitation   . Sleep apnea     USES CPAP EVERY NIGHT  . OSA on CPAP   . Erectile dysfunction   . CHF (congestive heart failure)   . Dysrhythmia     a fib  . Cancer     SKIN CANCERS  . Arthritis     HANDS  . Hyperlipidemia   . Atrial fibrillation 02/10/2013  . 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 AAA (abdominal aortic aneurysm) without rupture 03/03/2013    Small, partially thrombosed saccular aneurysm  . S/P Maze operation for atrial fibrillation 03/15/2013    Complete bilateral lesion set using cryothermy and bipolar radiofrequency ablation with oversewing of LA appendage via right mini thoracotomy    Procedures: MINIMALLY INVASIVE MITRAL VALVE REPAIR - 03/15/2013  Complex valvuloplasty including triangular resection of flail segment of posterior leaflet  Artificial Gore-tex neocord placement x2  Sorin Memo 3D Ring Annuloplasty  MAZE PROCEDURE  Complete bilateral atrial lesion set using cryothermy and bipolar radiofrequency ablation   Oversewing of Left Atrial Appendage      HPI:  The patient is a 78 y.o.  male who over the past several months has developed exertional shortness of breath, decreased energy, and increased lower extremity swelling.  He was seen by his primary care physician and was found to be in atrial fibrillation.  He was referred to Dr. Radford Pax for cardiology consultation. An echocardiogram was performed demonstrating mitral valve prolapse with severe mitral regurgitation. Left ventricular size and systolic function appeared normal with ejection fraction 71%. There was mild left atrial enlargement. The patient subsequently underwent transesophageal echocardiogram which confirmed the presence of mitral valve prolapse with a flail segment of the posterior leaflet and severe mitral regurgitation. Cardiac catheterization was performed and showed no significant coronary artery disease. The patient was referred to Dr. Roxy Manns for possible surgical intervention. He was originally seen in consultation on 02/10/2013 and was scheduled for minimally invasive mitral valve repair and Maze procedure. However, when he returned for followup on 02/20/2013, he had suffered fairly extensive lacerations on his right forearm while he was working in his garden at home. He was noted to have fairly severe associated cellulitis for which he had been prescribed oral clindamycin.  His surgery was postponed, and his cellulitis did resolve.  He was re-evaluated by Dr. Roxy Manns on 03/12/2013 and was cleared to proceed with surgery.  Hospital Course:  The patient was admitted to [facility] on 03/15/2013.  All risks, benefits and alternatives of surgery were explained in detail, and the patient agreed to proceed. The patient was taken to the operating room and underwent the above procedure.    The postoperative course has generally been uneventful.  He initially had some junctional bradycardia, which required pacing.  His rhythm has improved, and he is maintaining sinus rhythm.  His creatinine bumped up to 1.9 postop, but this has  trended back down to his baseline (1.4-1.7).  He was started on Lasix for a mild volume overload and is diuresing well. He has had a mild postop blood loss anemia, but has not required transfusion.  He is ambulating in the halls without difficulty, and is tolerating a regular diet. His INR is therapeutic. He has been evaluated on today's date and is ready for discharge home.    Recent vital signs:  Filed Vitals:   03/20/13 0459  BP: 135/66  Pulse: 74  Temp: 98.5 F (36.9 C)  Resp: 20    Recent laboratory studies:  CBC: Recent Labs  03/18/13 0500 03/20/13 0600  WBC 8.5 7.2  HGB 9.8* 10.2*  HCT 28.9* 29.9*  PLT 103* 170   BMET:  Recent Labs  03/18/13 0500 03/19/13 0500  NA 135 137  K 4.3 3.8  CL 101 101  CO2 26 30  GLUCOSE 98 103*  BUN 29* 26*  CREATININE 1.72* 1.64*  CALCIUM 8.1* 8.4    PT/INR:  Recent Labs  03/20/13 0600  LABPROT 22.7*  INR 2.10*     Discharge Medications:     Medication List    STOP taking these medications       metoprolol tartrate 25 MG tablet  Commonly known as:  LOPRESSOR      TAKE these medications       amiodarone 200 MG tablet  Commonly known as:  PACERONE  Take 1 tablet (200 mg total) by mouth 2 (two) times daily. Begin 7 days prior to surgery.     aspirin 81 MG EC tablet  Take 1 tablet (81 mg total) by mouth daily.     cetirizine 10 MG tablet  Commonly known as:  ZYRTEC  Take 10 mg by mouth as needed for allergies.     cholecalciferol 1000 UNITS tablet  Commonly known as:  VITAMIN D  Take 2,000 Units by mouth every morning.     furosemide 20 MG tablet  Commonly known as:  LASIX  Take 2 tablets (40 mg total) by mouth daily.     Glucosamine Chondr 500 Complex Caps  Take 2 capsules by mouth daily.     oxyCODONE 5 MG immediate release tablet  Commonly known as:  Oxy IR/ROXICODONE  Take 1-2 tablets (5-10 mg total) by mouth every 3 (three) hours as needed for pain.     potassium chloride SA 20 MEQ tablet    Commonly known as:  K-DUR,KLOR-CON  Take 1 tablet (20 mEq total) by mouth daily.     warfarin 5 MG tablet  Commonly known as:  COUMADIN  Take 2.5 mg by mouth daily. HOLDING PRIOR TO SURGERY ON 02/23/13     WELCHOL 3.75 G Pack  Generic drug:  Colesevelam HCl  Take 1 packet by mouth every evening.          Discharge Instructions:  The patient is to refrain from driving, heavy lifting or strenuous activity.  May shower daily and clean incisions with  soap and water.  May resume regular diet.   Follow Up: Follow-up Information   Follow up with Sueanne Margarita, MD. Schedule an appointment as soon as possible for a visit in 2 weeks.   Contact information:   [address] [place] [address] [phone]       Follow up with Rexene Alberts, MD In 1 week. (Office will contact you with appointment)    Contact information:   [place] [address] [phone]       Follow up On 03/23/2013. (Have bloodwork (PT/INR) checked at Dr. Theodosia Blender office to follow Coumadin)           Kyel Purk H 03/20/2013, 1:01 PM

## 2013-03-20 NOTE — Progress Notes (Signed)
Pt ambulated approx 800 ft with RW and RN on RA.  Tolerated very well, steady gait and pace, no SOB, no breaks needed.  Back to recliner with feet elevated and call bell in reach.  Will continue to monitor and encourage. Renee Pain

## 2013-03-21 ENCOUNTER — Inpatient Hospital Stay (HOSPITAL_COMMUNITY): Payer: Medicare Other

## 2013-03-21 LAB — PROTIME-INR
INR: 2.34 — ABNORMAL HIGH (ref 0.00–1.49)
Prothrombin Time: 24.6 seconds — ABNORMAL HIGH (ref 11.6–15.2)

## 2013-03-21 MED ORDER — OXYCODONE HCL 5 MG PO TABS: 5.0000 mg | ORAL_TABLET | ORAL | Status: DC | PRN

## 2013-03-21 MED ORDER — WARFARIN SODIUM 2 MG PO TABS: 2.0000 mg | ORAL_TABLET | Freq: Every day | ORAL | Status: DC

## 2013-03-21 MED ORDER — WARFARIN SODIUM 2.5 MG PO TABS
2.5000 mg | ORAL_TABLET | Freq: Every day | ORAL | Status: DC
  Filled 2013-03-21: qty 1

## 2013-03-21 MED ORDER — ASPIRIN 81 MG PO TBEC: 81.0000 mg | DELAYED_RELEASE_TABLET | Freq: Every day | ORAL | Status: DC

## 2013-03-21 MED ORDER — POTASSIUM CHLORIDE CRYS ER 20 MEQ PO TBCR: 20.0000 meq | EXTENDED_RELEASE_TABLET | Freq: Every day | ORAL | Status: DC

## 2013-03-21 NOTE — Progress Notes (Signed)
Pt sustained abrasion to Rt lower leg while attempted to get OOB without assist. He hit his rt  leg on bedside table.  Dry dressing gauze initiated. Nurse re-educate pt to make use of his call lights. Safety precaution in place and no indications of distress noted. Will continue to monitor.

## 2013-03-21 NOTE — Progress Notes (Signed)
[id number] Cardiac Rehab Completed discharge education with pt daughter and wife. They voice understanding. Pt agrees to [facility]. CRP in [place], will send referral.

## 2013-03-21 NOTE — Progress Notes (Addendum)
                    [address]            [place] [address]          [phone]     6 Days Post-Op Procedure(s) (LRB): MINIMALLY INVASIVE MITRAL VALVE REPAIR (MVR) (Right) MINIMALLY INVASIVE MAZE PROCEDURE (Right) INTRAOPERATIVE TRANSESOPHAGEAL ECHOCARDIOGRAM (Right)  Subjective: Feels well.  Scraped his shin on his tray table when getting OOB this am and sustained an abrasion.  No other complaints.   Objective: Vital signs in last 24 hours: Patient Vitals for the past 24 hrs:  BP Temp Temp src Pulse Resp SpO2 Weight  03/21/13 0500 143/70 mmHg 98.3 F (36.8 C) Oral 73 20 95 % 161 lb 9.6 oz (73.3 kg)  03/20/13 2057 115/66 mmHg 98.4 F (36.9 C) Oral 61 18 97 % -  03/20/13 1345 112/63 mmHg 98.4 F (36.9 C) Oral 60 18 98 % -   Current Weight  03/21/13 161 lb 9.6 oz (73.3 kg)  Pre op weight 78 kg    Intake/Output from previous day: 04/28 0701 - 04/29 0700 In: 960 [P.O.:960] Out: 1360 [Urine:1050; Chest Tube:310]    PHYSICAL EXAM:  Heart: RRR Lungs: Clear Wound: Clean and dry Extremities: Mild RLE edema.  3-4 cm abrasion to R lower leg.    Lab Results: CBC: Recent Labs  03/20/13 0600  WBC 7.2  HGB 10.2*  HCT 29.9*  PLT 170   BMET:  Recent Labs  03/19/13 0500  NA 137  K 3.8  CL 101  CO2 30  GLUCOSE 103*  BUN 26*  CREATININE 1.64*  CALCIUM 8.4    PT/INR:  Recent Labs  03/21/13 0530  LABPROT 24.6*  INR 2.34*      Assessment/Plan: S/P Procedure(s) (LRB): MINIMALLY INVASIVE MITRAL VALVE REPAIR (MVR) (Right) MINIMALLY INVASIVE MAZE PROCEDURE (Right) INTRAOPERATIVE TRANSESOPHAGEAL ECHOCARDIOGRAM (Right)  CV- SR, rates in 70s.  Continue low dose Amio.  Vol overload- continue diuresis.  CRI- Cr stable.  Home later today- instructions reviewed with patient.    LOS: 6 days    COLLINS,GINA H 03/21/2013  I have seen and examined the patient and agree with the assessment and plan as outlined. D/C home on coumadin  2.5 mg daily.  Mitzie Marlar H 03/21/2013 8:26 AM

## 2013-03-23 ENCOUNTER — Other Ambulatory Visit: Payer: Self-pay | Admitting: *Deleted

## 2013-03-23 DIAGNOSIS — I059 Rheumatic mitral valve disease, unspecified: Secondary | ICD-10-CM

## 2013-03-27 ENCOUNTER — Encounter: Payer: Self-pay | Admitting: Thoracic Surgery (Cardiothoracic Vascular Surgery)

## 2013-03-27 ENCOUNTER — Ambulatory Visit
Admission: RE | Admit: 2013-03-27 | Discharge: 2013-03-27 | Disposition: A | Discharge status: Home / Self Care | Payer: Medicare Other | Source: Ambulatory Visit | Attending: Thoracic Surgery (Cardiothoracic Vascular Surgery) | Admitting: Thoracic Surgery (Cardiothoracic Vascular Surgery)

## 2013-03-27 ENCOUNTER — Ambulatory Visit (INDEPENDENT_AMBULATORY_CARE_PROVIDER_SITE_OTHER): Payer: Self-pay | Admitting: Thoracic Surgery (Cardiothoracic Vascular Surgery)

## 2013-03-27 VITALS — BP 134/71 | HR 67 | Resp 20 | Ht 70.0 in | Wt 161.0 lb

## 2013-03-27 DIAGNOSIS — I4891 Unspecified atrial fibrillation: Secondary | ICD-10-CM

## 2013-03-27 DIAGNOSIS — Z9889 Other specified postprocedural states: Secondary | ICD-10-CM

## 2013-03-27 DIAGNOSIS — I059 Rheumatic mitral valve disease, unspecified: Secondary | ICD-10-CM

## 2013-03-27 DIAGNOSIS — Z8679 Personal history of other diseases of the circulatory system: Secondary | ICD-10-CM

## 2013-03-27 MED ORDER — AMIODARONE HCL 200 MG PO TABS: 200.0000 mg | ORAL_TABLET | Freq: Every day | ORAL | Status: DC

## 2013-03-27 NOTE — Patient Instructions (Signed)
The patient may continue to gradually increase their physical activity as tolerated.  They should refrain from any heavy lifting or strenuous use of their arms and shoulders until at least 8 weeks from the time of their surgery, and they should avoid activities that cause increased pain in their chest on the side of their surgical incision.  Otherwise they may continue to increase their activities without any particular limitations.  The patient may return to driving an automobile next week as long as they are no longer requiring oral narcotic pain relievers during the daytime.  It would be wise to start driving only short distances during the daylight and gradually increase from there as they feel comfortable.  Stop taking aspirin while you are taking coumadin (warfarin)  Decrease amiodarone to one tablet (200 mg) daily  Increase lasix (furosemide) to one tablet (40 mg) two times daily x 7 days then resume taking it once daily  Increase potassium to one tablet (20 mEq) two times daily x 7 days then resume taking it once daily  Continue to monitor your weight daily

## 2013-03-27 NOTE — Progress Notes (Signed)
[address]            [place] [address]          [phone]     CARDIOTHORACIC SURGERY OFFICE NOTE  Referring Provider is TURNER, Eber Hong, MD PCP is Irven Shelling, MD   HPI:  Patient returns for followup status post minimally invasive mitral valve repair and Maze procedure on 03/15/2013. His postoperative recovery has been uncomplicated. Since hospital discharge the patient has continued to do fairly well. He has had his prothrombin time checked and Coumadin dose monitored through the [facility] cardiology office. He reports that he is walking every day physically progressing quite well. He plans to start the cardiac rehabilitation program next week. He is not having shortness of breath. He has minimal pain in his chest with occasional very brief transient sharp pains that radiates across his chest. However, these have been mild and he has not been taking any pain relievers. He is sleeping well at night. His appetite is good. He's not having any tachypalpitations or dizzy spells. He does still have some lower extremity edema. He states that his weight has remained stable since hospital discharge without any further decrease, and he remains several pounds above his baseline.   Current Outpatient Prescriptions  Medication Sig Dispense Refill  . amiodarone (PACERONE) 200 MG tablet Take 1 tablet (200 mg total) by mouth daily. Begin 7 days prior to surgery.  60 tablet  0  . cetirizine (ZYRTEC) 10 MG tablet Take 10 mg by mouth as needed for allergies.      . cholecalciferol (VITAMIN D) 1000 UNITS tablet Take 2,000 Units by mouth every morning.       . Colesevelam HCl (WELCHOL) 3.75 G PACK Take 1 packet by mouth every evening.      . furosemide (LASIX) 20 MG tablet Take 2 tablets (40 mg total) by mouth daily.  60 tablet  11  . Glucosamine-Chondroit-Vit C-Mn (GLUCOSAMINE CHONDR 500 COMPLEX) CAPS Take 2 capsules by mouth daily.       Marland Kitchen oxyCODONE (OXY  IR/ROXICODONE) 5 MG immediate release tablet Take 1-2 tablets (5-10 mg total) by mouth every 3 (three) hours as needed for pain.  30 tablet  0  . potassium chloride SA (K-DUR,KLOR-CON) 20 MEQ tablet Take 1 tablet (20 mEq total) by mouth daily.  30 tablet  1  . warfarin (COUMADIN) 5 MG tablet Take 2.5 mg by mouth daily. HOLDING PRIOR TO SURGERY ON 02/23/13       No current facility-administered medications for this visit.      Physical Exam:   BP 134/71  Pulse 67  Resp 20  Ht 5\' 10"  (1.778 m)  Wt 161 lb (73.029 kg)  BMI 23.1 kg/m2  SpO2 97%  General:  Well-appearing  Chest:   Clear to auscultation  CV:   Regular rate and rhythm without murmur  Incisions:  Clean and dry and healing nicely, chest tube sutures are removed  Abdomen:  Soft and nontender  Extremities:  Warm and well-perfused with moderate bilateral lower extremity edema  Diagnostic Tests:  2 channel telemetry rhythm strip demonstrates normal sinus rhythm  *RADIOLOGY REPORT*  Clinical Data: Mitral valve replacement surgery.  CHEST - 2 VIEW  Comparison: 03/21/2013.  Findings: The cardiac silhouette, mediastinal and hilar contours  are stable. There are small bilateral pleural effusions and  minimal residual right basilar atelectasis. Overall  the lungs show  improved aeration.  IMPRESSION:  1. Small residual bilateral pleural effusions and resolving right  basilar atelectasis.  2. No edema or infiltrates.  Original Report Authenticated By: Marijo Sanes, M.D.    Impression:  Patient is doing very well just 2 weeks following minimally invasive mitral valve repair and Maze procedure. He has minimal pain. He is ambulating quite well. He is maintaining sinus rhythm. He still has some significant lower extremity edema and his weight remains several pounds above baseline.  Plan:  I've instructed the patient to stop taking aspirin now that he is therapeutic on Coumadin. He should resume aspirin once Coumadin therapy is  discontinued. I've also instructed the patient cut his dose of amiodarone to 200 mg daily. Finally, I've instructed the patient to increase his dose of Lasix to 40 mg twice daily for the next week and then cut back to once a day again after that. In the meanwhile he will continue to monitor his weight. While his Lasix dose is increased he should also double his dose of potassium. I've encouraged patient to continue to gradually increase his physical activity and to go ahead and get start the cardiac rehabilitation program next week. I think as long as he continues to progress he can start driving his car next week to facilitate getting to and from the rehabilitation program. All of his questions been addressed. We will see him back in 4 weeks for routine followup and rhythm check.   Valentina Gu. Roxy Manns, MD 03/27/2013 11:43 AM

## 2013-04-06 ENCOUNTER — Encounter (HOSPITAL_COMMUNITY)
Admission: RE | Admit: 2013-04-06 | Discharge: 2013-04-06 | Disposition: A | Discharge status: Home / Self Care | Payer: Medicare Other | Source: Ambulatory Visit | Attending: Cardiology | Admitting: Cardiology

## 2013-04-06 DIAGNOSIS — I5032 Chronic diastolic (congestive) heart failure: Secondary | ICD-10-CM | POA: Insufficient documentation

## 2013-04-06 DIAGNOSIS — I4891 Unspecified atrial fibrillation: Secondary | ICD-10-CM | POA: Insufficient documentation

## 2013-04-06 DIAGNOSIS — I059 Rheumatic mitral valve disease, unspecified: Secondary | ICD-10-CM | POA: Insufficient documentation

## 2013-04-06 DIAGNOSIS — I079 Rheumatic tricuspid valve disease, unspecified: Secondary | ICD-10-CM | POA: Insufficient documentation

## 2013-04-06 DIAGNOSIS — I509 Heart failure, unspecified: Secondary | ICD-10-CM | POA: Insufficient documentation

## 2013-04-06 DIAGNOSIS — Z5189 Encounter for other specified aftercare: Secondary | ICD-10-CM | POA: Insufficient documentation

## 2013-04-06 NOTE — Progress Notes (Signed)
Cardiac Rehab Medication Review by a Pharmacist  Does the patient  feel that his/her medications are working for him/her?  yes  Has the patient been experiencing any side effects to the medications prescribed?  no  Does the patient measure his/her own blood pressure or blood glucose at home?  no , cuff available if needed  Does the patient have any problems obtaining medications due to transportation or finances?   no  Understanding of regimen: good Understanding of indications: fair Potential of compliance: good    Pharmacist comments: Pt's medication list was reviewed for accuracy, indications, adverse effects, and compliance. Any questions were addressed at this time.    Narda Bonds, PharmD Clinical Pharmacist Phone: [phone] Pager: [phone] 04/06/2013 8:46 AM

## 2013-04-10 ENCOUNTER — Encounter (HOSPITAL_COMMUNITY)
Admission: RE | Admit: 2013-04-10 | Discharge: 2013-04-10 | Disposition: A | Discharge status: Home / Self Care | Payer: Medicare Other | Source: Ambulatory Visit | Attending: Cardiology | Admitting: Cardiology

## 2013-04-10 NOTE — Progress Notes (Signed)
Pt started cardiac rehab today.  Pt tolerated light exercise without difficulty. Telemetry rhythm sinus without ectopy. Will continue to monitor the patient throughout  the program.

## 2013-04-12 ENCOUNTER — Encounter (HOSPITAL_COMMUNITY)
Admission: RE | Admit: 2013-04-12 | Discharge: 2013-04-12 | Disposition: A | Discharge status: Home / Self Care | Payer: Medicare Other | Source: Ambulatory Visit | Attending: Cardiology | Admitting: Cardiology

## 2013-04-14 ENCOUNTER — Encounter (HOSPITAL_COMMUNITY)
Admission: RE | Admit: 2013-04-14 | Discharge: 2013-04-14 | Disposition: A | Discharge status: Home / Self Care | Payer: Medicare Other | Source: Ambulatory Visit | Attending: Cardiology | Admitting: Cardiology

## 2013-04-17 ENCOUNTER — Encounter (HOSPITAL_COMMUNITY): Payer: Medicare Other

## 2013-04-19 ENCOUNTER — Encounter (HOSPITAL_COMMUNITY)
Admission: RE | Admit: 2013-04-19 | Discharge: 2013-04-19 | Disposition: A | Discharge status: Home / Self Care | Payer: Medicare Other | Source: Ambulatory Visit | Attending: Cardiology | Admitting: Cardiology

## 2013-04-21 ENCOUNTER — Other Ambulatory Visit: Payer: Self-pay | Admitting: *Deleted

## 2013-04-21 ENCOUNTER — Encounter (HOSPITAL_COMMUNITY)
Admission: RE | Admit: 2013-04-21 | Discharge: 2013-04-21 | Disposition: A | Discharge status: Home / Self Care | Payer: Medicare Other | Source: Ambulatory Visit | Attending: Cardiology | Admitting: Cardiology

## 2013-04-21 DIAGNOSIS — I340 Nonrheumatic mitral (valve) insufficiency: Secondary | ICD-10-CM

## 2013-04-24 ENCOUNTER — Encounter: Payer: Self-pay | Admitting: Thoracic Surgery (Cardiothoracic Vascular Surgery)

## 2013-04-24 ENCOUNTER — Ambulatory Visit (INDEPENDENT_AMBULATORY_CARE_PROVIDER_SITE_OTHER): Payer: Self-pay | Admitting: Thoracic Surgery (Cardiothoracic Vascular Surgery)

## 2013-04-24 ENCOUNTER — Encounter (HOSPITAL_COMMUNITY)
Admission: RE | Admit: 2013-04-24 | Discharge: 2013-04-24 | Disposition: A | Discharge status: Home / Self Care | Payer: Medicare Other | Source: Ambulatory Visit | Attending: Cardiology | Admitting: Cardiology

## 2013-04-24 VITALS — BP 119/68 | HR 68 | Resp 20 | Ht 70.0 in | Wt 158.0 lb

## 2013-04-24 DIAGNOSIS — I079 Rheumatic tricuspid valve disease, unspecified: Secondary | ICD-10-CM | POA: Insufficient documentation

## 2013-04-24 DIAGNOSIS — Z8679 Personal history of other diseases of the circulatory system: Secondary | ICD-10-CM

## 2013-04-24 DIAGNOSIS — I4891 Unspecified atrial fibrillation: Secondary | ICD-10-CM | POA: Insufficient documentation

## 2013-04-24 DIAGNOSIS — Z5189 Encounter for other specified aftercare: Secondary | ICD-10-CM | POA: Insufficient documentation

## 2013-04-24 DIAGNOSIS — I059 Rheumatic mitral valve disease, unspecified: Secondary | ICD-10-CM | POA: Insufficient documentation

## 2013-04-24 DIAGNOSIS — I341 Nonrheumatic mitral (valve) prolapse: Secondary | ICD-10-CM

## 2013-04-24 DIAGNOSIS — Z9889 Other specified postprocedural states: Secondary | ICD-10-CM

## 2013-04-24 DIAGNOSIS — I340 Nonrheumatic mitral (valve) insufficiency: Secondary | ICD-10-CM

## 2013-04-24 DIAGNOSIS — I509 Heart failure, unspecified: Secondary | ICD-10-CM | POA: Insufficient documentation

## 2013-04-24 DIAGNOSIS — I5032 Chronic diastolic (congestive) heart failure: Secondary | ICD-10-CM | POA: Insufficient documentation

## 2013-04-24 NOTE — Patient Instructions (Signed)
Stop amiodarone but continue all other medications without change

## 2013-04-24 NOTE — Progress Notes (Signed)
Reviewed home exercise with pt today.  Pt plans to continue to walk at home with dogs for exercise.  Reviewed THR, pulse, RPE, sign and symptoms, and when to call 911 or MD.  Pt voiced understanding. Alberteen Sam, MA, ACSM RCEP

## 2013-04-24 NOTE — Progress Notes (Signed)
[address]       [place] [address]             [phone]     CARDIOTHORACIC SURGERY OFFICE NOTE  Referring Provider is TURNER, Eber Hong, MD PCP is Irven Shelling, MD   HPI:  Patient returns for followup status post minimally invasive mitral valve repair and Maze procedure on 03/15/2013. His postoperative recovery has been uncomplicated.  He was last seen here in the office on 03/27/2013. At that time he was appeared somewhat volume overloaded, and he was restarted on oral Lasix 40 mg twice daily. Subsequent to that he did develop some dizziness and he cut his dose of Lasix back to 40 mg once a day. Since then he has been doing very well. He states that the swelling in his legs has almost completely gone but he still has mild swelling at the level of his ankles. He is not having any dizziness whatsoever. He's not having any tachypalpitations. He has minimal residual soreness in his chest and he is not taking any type of pain relievers. He has been somewhat forgetful, but this was noticed prior to surgery.  He has been actively participating in the cardiac rehabilitation program and doing quite well.    Current Outpatient Prescriptions  Medication Sig Dispense Refill  . amiodarone (PACERONE) 200 MG tablet Take 200 mg by mouth daily.      . cetirizine (ZYRTEC) 10 MG tablet Take 10 mg by mouth as needed for allergies.      . cholecalciferol (VITAMIN D) 1000 UNITS tablet Take 2,000 Units by mouth every morning.       . Colesevelam HCl (WELCHOL) 3.75 G PACK Take 1 packet by mouth every evening.      . furosemide (LASIX) 20 MG tablet Take 2 tablets (40 mg total) by mouth daily.  60 tablet  11  . Glucosamine-Chondroit-Vit C-Mn (GLUCOSAMINE CHONDR 500 COMPLEX) CAPS Take 2 capsules by mouth daily.       Marland Kitchen oxyCODONE (OXY IR/ROXICODONE) 5 MG immediate release tablet Take 1-2 tablets (5-10 mg total) by mouth every 3 (three) hours as needed for pain.  30 tablet  0  .  potassium chloride SA (K-DUR,KLOR-CON) 20 MEQ tablet Take 1 tablet (20 mEq total) by mouth daily.  30 tablet  1  . warfarin (COUMADIN) 5 MG tablet Take 1.25-2.5 mg by mouth daily. 1.25mg  on Tues,Thurs, Sat, Sun. And 2.5mg  on Mon ,Wed, Fri, all other days       No current facility-administered medications for this visit.      Physical Exam:   BP 119/68  Pulse 68  Resp 20  Ht 5\' 10"  (1.778 m)  Wt 158 lb (71.668 kg)  BMI 22.67 kg/m2  SpO2 97%  General:  Well-appearing  Chest:   Clear to auscultation with symmetrical breath sounds  CV:   Regular rate and rhythm without murmur  Incisions:  Healing nicely  Abdomen:  Soft and nontender  Extremities:  Warm and well-perfused with moderate lower extremity edema at the ankle  Diagnostic Tests:  2 channel telemetry rhythm strip demonstrates normal sinus rhythm   Impression:  Patient is progressing well less than 6 weeks following minimally invasive mitral valve repair and Maze procedure. His volume overload is much improved. He is maintaining sinus rhythm. He was noted to be somewhat forgetful prior to surgery and prone to falls. We would hope to discontinue Coumadin as soon as possible, although given his  history of atrial fibrillation and recent mitral valve repair we should probably continue anticoagulation for least another 4-6 weeks.    Plan:  I've instructed the patient does.taking amiodarone. I've instructed him to continue all other medications without change. I've encouraged patient to continue to increase his physical activity as tolerated without any particular limitations but I've reminded him to be very careful given the fact that he remains on blood thinner therapy. We will plan to see him back in 8 weeks for followup and rhythm check. I would like to see a followup echocardiogram performed at some point within the next month or 2. We will leave this to Dr. Theodosia Blender discretion.   Valentina Gu. Roxy Manns, MD 04/24/2013 4:13  PM

## 2013-04-26 ENCOUNTER — Encounter (HOSPITAL_COMMUNITY)
Admission: RE | Admit: 2013-04-26 | Discharge: 2013-04-26 | Disposition: A | Discharge status: Home / Self Care | Payer: Medicare Other | Source: Ambulatory Visit | Attending: Cardiology | Admitting: Cardiology

## 2013-04-28 ENCOUNTER — Encounter (HOSPITAL_COMMUNITY): Payer: Medicare Other

## 2013-05-01 ENCOUNTER — Encounter (HOSPITAL_COMMUNITY)
Admission: RE | Admit: 2013-05-01 | Discharge: 2013-05-01 | Disposition: A | Discharge status: Home / Self Care | Payer: Medicare Other | Source: Ambulatory Visit | Attending: Cardiology | Admitting: Cardiology

## 2013-05-03 ENCOUNTER — Encounter (HOSPITAL_COMMUNITY)
Admission: RE | Admit: 2013-05-03 | Discharge: 2013-05-03 | Disposition: A | Discharge status: Home / Self Care | Payer: Medicare Other | Source: Ambulatory Visit | Attending: Cardiology | Admitting: Cardiology

## 2013-05-05 ENCOUNTER — Encounter (HOSPITAL_COMMUNITY)
Admission: RE | Admit: 2013-05-05 | Discharge: 2013-05-05 | Disposition: A | Discharge status: Home / Self Care | Payer: Medicare Other | Source: Ambulatory Visit | Attending: Cardiology | Admitting: Cardiology

## 2013-05-05 NOTE — Progress Notes (Signed)
[name] notified us that today will be his last day of exercise at cardiac rehab.  [name] plans to exercise on his own.  [name] has equipment at home.

## 2013-05-08 ENCOUNTER — Encounter (HOSPITAL_COMMUNITY): Payer: Medicare Other

## 2013-05-10 ENCOUNTER — Encounter (HOSPITAL_COMMUNITY): Payer: Medicare Other

## 2013-05-12 ENCOUNTER — Encounter (HOSPITAL_COMMUNITY): Payer: Medicare Other

## 2013-05-15 ENCOUNTER — Encounter (HOSPITAL_COMMUNITY): Payer: Medicare Other

## 2013-05-17 ENCOUNTER — Encounter (HOSPITAL_COMMUNITY): Payer: Medicare Other

## 2013-05-19 ENCOUNTER — Encounter (HOSPITAL_COMMUNITY): Payer: Medicare Other

## 2013-05-22 ENCOUNTER — Encounter (HOSPITAL_COMMUNITY): Payer: Medicare Other

## 2013-05-24 ENCOUNTER — Encounter (HOSPITAL_COMMUNITY): Payer: Medicare Other

## 2013-05-26 ENCOUNTER — Encounter (HOSPITAL_COMMUNITY): Payer: Medicare Other

## 2013-05-29 ENCOUNTER — Encounter (HOSPITAL_COMMUNITY): Payer: Medicare Other

## 2013-05-31 ENCOUNTER — Encounter (HOSPITAL_COMMUNITY): Payer: Medicare Other

## 2013-06-02 ENCOUNTER — Encounter (HOSPITAL_COMMUNITY): Payer: Medicare Other

## 2013-06-05 ENCOUNTER — Encounter (HOSPITAL_COMMUNITY): Payer: Medicare Other

## 2013-06-07 ENCOUNTER — Encounter (HOSPITAL_COMMUNITY): Payer: Medicare Other

## 2013-06-09 ENCOUNTER — Encounter (HOSPITAL_COMMUNITY): Payer: Medicare Other

## 2013-06-12 ENCOUNTER — Encounter (HOSPITAL_COMMUNITY): Payer: Medicare Other

## 2013-06-14 ENCOUNTER — Encounter (HOSPITAL_COMMUNITY): Payer: Medicare Other

## 2013-06-16 ENCOUNTER — Encounter (HOSPITAL_COMMUNITY): Payer: Medicare Other

## 2013-06-19 ENCOUNTER — Encounter: Payer: Self-pay | Admitting: Thoracic Surgery (Cardiothoracic Vascular Surgery)

## 2013-06-19 ENCOUNTER — Ambulatory Visit (INDEPENDENT_AMBULATORY_CARE_PROVIDER_SITE_OTHER): Payer: Medicare Other | Admitting: Thoracic Surgery (Cardiothoracic Vascular Surgery)

## 2013-06-19 ENCOUNTER — Encounter (HOSPITAL_COMMUNITY): Payer: Medicare Other

## 2013-06-19 VITALS — BP 133/82 | HR 75 | Resp 16 | Ht 70.0 in | Wt 158.0 lb

## 2013-06-19 DIAGNOSIS — I059 Rheumatic mitral valve disease, unspecified: Secondary | ICD-10-CM

## 2013-06-19 DIAGNOSIS — Z9889 Other specified postprocedural states: Secondary | ICD-10-CM

## 2013-06-19 DIAGNOSIS — I340 Nonrheumatic mitral (valve) insufficiency: Secondary | ICD-10-CM

## 2013-06-19 NOTE — Progress Notes (Signed)
      [address]       [place] [address]             [phone]     CARDIOTHORACIC SURGERY OFFICE NOTE  Referring Provider is TURNER, Eber Hong, MD PCP is Irven Shelling, MD   HPI:  Patient returns for followup status post minimally invasive mitral valve repair and Maze procedure on 03/15/2013.  He was last seen here in the office on 04/24/2013. Since then he has continued to make gradual improvement. He is now returned to essentially normal activity. He is playing 18 holes of golf on a regular basis and taking care of chores around the house and in the yard. He states that he still gets tired more easily than he used to prior to surgery, but overall he seems to be slowly improving. He has had some mild dizzy spells intermittently, but these also seen to be improving.  He has not had any tachypalpitations.  Is not sure whether or not he has had an echocardiogram surgery last April.    Current Outpatient Prescriptions  Medication Sig Dispense Refill  . cetirizine (ZYRTEC) 10 MG tablet Take 10 mg by mouth as needed for allergies.      . cholecalciferol (VITAMIN D) 1000 UNITS tablet Take 2,000 Units by mouth every morning.       . Colesevelam HCl (WELCHOL) 3.75 G PACK Take 1 packet by mouth every evening.      . furosemide (LASIX) 20 MG tablet Take 2 tablets (40 mg total) by mouth daily.  60 tablet  11  . Glucosamine-Chondroit-Vit C-Mn (GLUCOSAMINE CHONDR 500 COMPLEX) CAPS Take 2 capsules by mouth daily.       . potassium chloride SA (K-DUR,KLOR-CON) 20 MEQ tablet Take 1 tablet (20 mEq total) by mouth daily.  30 tablet  1  . warfarin (COUMADIN) 5 MG tablet Take 1.25-2.5 mg by mouth daily. 1.25mg  on Tues,Thurs, Sat, Sun. And 2.5mg  on Mon ,Wed, Fri, all other days..* OR AS DIRECTED       No current facility-administered medications for this visit.      Physical Exam:   BP 133/82  Pulse 75  Resp 16  Ht 5\' 10"  (1.778 m)  Wt 158 lb (71.668 kg)  BMI 22.67 kg/m2   SpO2 98%  General:  Well-appearing  Chest:   Clear to auscultation  CV:   Regular rate and rhythm without murmur  Incisions:  Well-healed  Abdomen:  Soft and nontender  Extremities:  Warm and well-perfused with mild lower extremity edema, severe varicose veins  Diagnostic Tests:  2 channel telemetry rhythm strip demonstrates normal sinus rhythm   Impression:  The patient is clinically doing well 3 months following minimally invasive mitral valve repair and Maze procedure. He is currently maintaining sinus rhythm.  Plan:  At some point I would favor routine followup echocardiogram, but we will leave this to Dr. Theodosia Blender discretion. We'll plan to see Mr. [name] back in 3 months for routine followup and rhythm check.   Valentina Gu. Roxy Manns, MD 06/19/2013 12:57 PM

## 2013-06-21 ENCOUNTER — Encounter (HOSPITAL_COMMUNITY): Payer: Medicare Other

## 2013-06-23 ENCOUNTER — Encounter (HOSPITAL_COMMUNITY): Payer: Medicare Other

## 2013-06-26 ENCOUNTER — Encounter (HOSPITAL_COMMUNITY): Payer: Medicare Other

## 2013-06-27 ENCOUNTER — Ambulatory Visit (INDEPENDENT_AMBULATORY_CARE_PROVIDER_SITE_OTHER): Payer: Medicare Other | Admitting: Thoracic Surgery (Cardiothoracic Vascular Surgery)

## 2013-06-27 VITALS — BP 125/78 | HR 74 | Resp 20 | Ht 70.0 in | Wt 158.0 lb

## 2013-06-27 DIAGNOSIS — Z9889 Other specified postprocedural states: Secondary | ICD-10-CM

## 2013-06-27 MED ORDER — CEPHALEXIN 500 MG PO CAPS: 500.0000 mg | ORAL_CAPSULE | Freq: Three times a day (TID) | ORAL | Status: DC

## 2013-06-27 NOTE — Progress Notes (Signed)
HPI:  [name] is an 79 yo man who had a mitral valve repair and maze procedure by Dr. Roxy Manns in May. He presents today because of redness and swelling of one of his incisions. This he says he just noted this over the past couple of days. He says that at one end of the right subclavicular incision he developed a blister and redness. He has not had any fevers, chills or sweats. He says it is not painful or tender. He is on Coumadin.  Past Medical History  Diagnosis Date  . Heart palpitations     PT STATES NOT REALLY A BIG PROBLEM--DENIES CHEST PAINS  . Insomnia   . BPH (benign prostatic hypertrophy)   . Constipation   . History of torn meniscus of left knee     PAINFUL LEFT KNEE  . Hearing loss     BILATERAL - WEARS HEARING AIDS  . Hyperlipidemia   . Mitral valve prolapse   . Mitral valve regurgitation   . Sleep apnea     USES CPAP EVERY NIGHT  . OSA on CPAP   . Erectile dysfunction   . CHF (congestive heart failure)   . Dysrhythmia     a fib  . Cancer     SKIN CANCERS  . Arthritis     HANDS  . Hyperlipidemia   . Atrial fibrillation 02/10/2013  . 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 AAA (abdominal aortic aneurysm) without rupture 03/03/2013    Small, partially thrombosed saccular aneurysm  . S/P Maze operation for atrial fibrillation 03/15/2013    Complete bilateral lesion set using cryothermy and bipolar radiofrequency ablation with oversewing of LA appendage via right mini thoracotomy  . Chronic diastolic congestive heart failure 03/20/2013      Current Outpatient Prescriptions  Medication Sig Dispense Refill  . cephALEXin (KEFLEX) 500 MG capsule Take 1 capsule (500 mg total) by mouth 3 (three) times daily.  21 capsule  0  . cetirizine (ZYRTEC) 10 MG tablet Take 10 mg by mouth as needed for allergies.      . cholecalciferol (VITAMIN D) 1000 UNITS tablet Take 2,000 Units by mouth every morning.       . Colesevelam HCl (WELCHOL) 3.75 G PACK Take 1 packet by mouth  every evening.      . furosemide (LASIX) 20 MG tablet Take 2 tablets (40 mg total) by mouth daily.  60 tablet  11  . Glucosamine-Chondroit-Vit C-Mn (GLUCOSAMINE CHONDR 500 COMPLEX) CAPS Take 2 capsules by mouth daily.       . potassium chloride SA (K-DUR,KLOR-CON) 20 MEQ tablet Take 1 tablet (20 mEq total) by mouth daily.  30 tablet  1  . warfarin (COUMADIN) 5 MG tablet Take 1.25-2.5 mg by mouth daily. 1.25mg  on Tues,Thurs, Sat, Sun. And 2.5mg  on Mon ,Wed, Fri, all other days..* OR AS DIRECTED       No current facility-administered medications for this visit.    Physical Exam BP 125/78  Pulse 74  Resp 20  Ht 5\' 10"  (1.778 m)  Wt 158 lb (71.668 kg)  BMI 22.67 kg/m2  SpO35 16% 79 year old male in no acute distress Right subclavian incision intact. At medial end of the incision there is a blood blister 7-8 mm, with mild surrounding erythema, no significant tenderness. No pulsatility palpable.  Diagnostic Tests: None  Impression: 79 year old gentleman with blistering and erythema of an incision approximately 4 months postoperatively. He is on Coumadin and this may just be due  to some mild trauma to the area. There is nothing to suggest a pseudoaneurysm. Given that there is prosthetic material at that site I think it is best to treat him with antibiotics and have prescribed Keflex 500 mg by mouth 3 times a day.   Plan: Keflex 500 mg by mouth 3 times a day x1 week  Followup next week with Dr. Roxy Manns  The patient was instructed to call if he develops fevers, chills, or sweats, increasing size, redness, tenderness, or swelling.

## 2013-06-28 ENCOUNTER — Other Ambulatory Visit: Payer: Self-pay

## 2013-06-28 ENCOUNTER — Encounter (HOSPITAL_COMMUNITY): Payer: Medicare Other

## 2013-06-30 ENCOUNTER — Encounter (HOSPITAL_COMMUNITY): Payer: Medicare Other

## 2013-07-03 ENCOUNTER — Encounter (HOSPITAL_COMMUNITY): Payer: Medicare Other

## 2013-07-03 ENCOUNTER — Other Ambulatory Visit: Payer: Self-pay | Admitting: *Deleted

## 2013-07-04 ENCOUNTER — Ambulatory Visit (INDEPENDENT_AMBULATORY_CARE_PROVIDER_SITE_OTHER): Payer: Medicare Other | Admitting: Thoracic Surgery (Cardiothoracic Vascular Surgery)

## 2013-07-04 ENCOUNTER — Encounter: Payer: Self-pay | Admitting: Thoracic Surgery (Cardiothoracic Vascular Surgery)

## 2013-07-04 VITALS — BP 120/74 | HR 88 | Resp 20 | Ht 70.0 in | Wt 158.0 lb

## 2013-07-04 DIAGNOSIS — Z5189 Encounter for other specified aftercare: Secondary | ICD-10-CM

## 2013-07-04 DIAGNOSIS — Z9889 Other specified postprocedural states: Secondary | ICD-10-CM

## 2013-07-04 DIAGNOSIS — I059 Rheumatic mitral valve disease, unspecified: Secondary | ICD-10-CM

## 2013-07-04 NOTE — Progress Notes (Signed)
      [address]       [place] [address]             [phone]     CARDIOTHORACIC SURGERY OFFICE NOTE  Referring Provider is Sueanne Margarita, MD PCP is Irven Shelling, MD   HPI:  Patient returns for followup and wound check having originally undergone minimally invasive mitral valve repair and Maze procedure on 03/15/2013. He was  seen here in the office last week by Dr Roxan Hockey because he developed an area of redness and cellulitis along the medial aspect of the small incision in the right deltopectoral groove. He was prescribed oral Keflex. The patient states that the redness probably went away. However, over the weekend he developed some gross hematuria so he stopped taking Keflex for concerns that this might be related to the antibiotic. He is scheduled to have his prothrombin time checked later this week. He otherwise feels quite well and reports no other problems or complaints.    Current Outpatient Prescriptions  Medication Sig Dispense Refill  . cetirizine (ZYRTEC) 10 MG tablet Take 10 mg by mouth as needed for allergies.      . cholecalciferol (VITAMIN D) 1000 UNITS tablet Take 2,000 Units by mouth every morning.       . Colesevelam HCl (WELCHOL) 3.75 G PACK Take 1 packet by mouth every evening.      . furosemide (LASIX) 20 MG tablet Take 2 tablets (40 mg total) by mouth daily.  60 tablet  11  . Glucosamine-Chondroit-Vit C-Mn (GLUCOSAMINE CHONDR 500 COMPLEX) CAPS Take 2 capsules by mouth daily.       . potassium chloride SA (K-DUR,KLOR-CON) 20 MEQ tablet Take 1 tablet (20 mEq total) by mouth daily.  30 tablet  1  . warfarin (COUMADIN) 5 MG tablet Take 1.25-2.5 mg by mouth daily. 1.25mg  on Tues,Thurs, Sat, Sun. And 2.5mg  on Mon ,Wed, Fri, all other days..* OR AS DIRECTED       No current facility-administered medications for this visit.      Physical Exam:   BP 120/74  Pulse 88  Resp 20  Ht 5\' 10"  (1.778 m)  Wt 158 lb (71.668 kg)  BMI  22.67 kg/m2  SpO2 97%  General:  Well-appearing  Chest:   Clear to auscultation  CV:   Regular rate and rhythm without murmur  Incisions:  There is no associated cellulitis but there does appear to be a small tag of Vicryl suture emanating from the medial corner of the surgical incision in the right deltopectoral groove. This is trimmed and removed.  Abdomen:  Soft nontender  Extremities:  Warm and well-perfused  Diagnostic Tests:  n/a   Impression:  The patient's minor area of cellulitis appears to have resolved. This may been a stitch abscess that developed related to the subcutaneous sutures.  Plan:  Patient will return in 3 weeks for wound check to make her that no further problems develop.   Valentina Gu. Roxy Manns, MD 07/04/2013 5:19 PM

## 2013-07-05 ENCOUNTER — Encounter (HOSPITAL_COMMUNITY): Payer: Medicare Other

## 2013-07-07 ENCOUNTER — Encounter (HOSPITAL_COMMUNITY): Payer: Medicare Other

## 2013-07-10 ENCOUNTER — Encounter (HOSPITAL_COMMUNITY): Payer: Medicare Other

## 2013-07-12 ENCOUNTER — Encounter (HOSPITAL_COMMUNITY): Payer: Medicare Other

## 2013-07-14 ENCOUNTER — Encounter (HOSPITAL_COMMUNITY): Payer: Medicare Other

## 2013-07-19 ENCOUNTER — Other Ambulatory Visit: Payer: Self-pay | Admitting: Internal Medicine

## 2013-07-19 DIAGNOSIS — R413 Other amnesia: Secondary | ICD-10-CM

## 2013-07-27 ENCOUNTER — Other Ambulatory Visit: Payer: Self-pay | Admitting: *Deleted

## 2013-07-27 ENCOUNTER — Encounter: Payer: Self-pay | Admitting: Thoracic Surgery (Cardiothoracic Vascular Surgery)

## 2013-07-27 ENCOUNTER — Ambulatory Visit (INDEPENDENT_AMBULATORY_CARE_PROVIDER_SITE_OTHER): Payer: Medicare Other | Admitting: Thoracic Surgery (Cardiothoracic Vascular Surgery)

## 2013-07-27 VITALS — BP 125/78 | HR 79 | Resp 16 | Ht 70.0 in | Wt 158.0 lb

## 2013-07-27 DIAGNOSIS — Z5189 Encounter for other specified aftercare: Secondary | ICD-10-CM

## 2013-07-27 DIAGNOSIS — Z9889 Other specified postprocedural states: Secondary | ICD-10-CM

## 2013-07-27 DIAGNOSIS — Z8679 Personal history of other diseases of the circulatory system: Secondary | ICD-10-CM

## 2013-07-27 NOTE — Progress Notes (Signed)
      [address]       ,[place] [address]             [phone]     CARDIOTHORACIC SURGERY OFFICE NOTE  Referring Provider is Sueanne Margarita, MD PCP is Irven Shelling, MD   HPI:   Patient returns for followup and wound check having originally undergone minimally invasive mitral valve repair and Maze procedure on 03/15/2013. He was seen here in the office on 07/04/2013.  Prior to that he had been seen by Dr. Roxan Hockey because of a small area of redness and cellulitis along the medial aspect of the small incision in the right deltopectoral groove. This has resolved and the patient returns to the office for followup today. He does still have a diffuse erythematous rash all over his body for which he is being seen by his dermatologist. This is chronic and recurrent.   Current Outpatient Prescriptions  Medication Sig Dispense Refill  . cetirizine (ZYRTEC) 10 MG tablet Take 10 mg by mouth as needed for allergies.      . cholecalciferol (VITAMIN D) 1000 UNITS tablet Take 2,000 Units by mouth every morning.       . Colesevelam HCl (WELCHOL) 3.75 G PACK Take 1 packet by mouth every evening.      . furosemide (LASIX) 20 MG tablet Take 20 mg by mouth daily.      . Glucosamine-Chondroit-Vit C-Mn (GLUCOSAMINE CHONDR 500 COMPLEX) CAPS Take 2 capsules by mouth daily.       . potassium chloride SA (K-DUR,KLOR-CON) 20 MEQ tablet Take 1 tablet (20 mEq total) by mouth daily.  30 tablet  1  . warfarin (COUMADIN) 5 MG tablet Take 1.25-2.5 mg by mouth daily. 1.25mg  on Tues,Thurs, Sat, Sun. And 2.5mg  on Mon ,Wed, Fri, all other days..* OR AS DIRECTED       No current facility-administered medications for this visit.      Physical Exam:   BP 125/78  Pulse 79  Resp 16  Ht 5\' 10"  (1.778 m)  Wt 158 lb (71.668 kg)  BMI 22.67 kg/m2  SpO2 98%  General:  Well-appearing  Chest:   Clear to auscultation  CV:   Regular rate and rhythm without murmur  Incisions:  Completely  healed. A small suture fragment is excised from the medial aspect of the incision in the right deltopectoral groove  Abdomen:  Soft nontender  Extremities:  Warm and well-perfused  Diagnostic Tests:  n/a   Impression:  Resolved stitch abscess from the small incision in the right deltopectoral groove.  Plan:  The patient will return in 2 months for routine followup with rhythm check   Pleasant Britz H. Roxy Manns, MD 07/27/2013 11:33 AM

## 2013-07-28 ENCOUNTER — Ambulatory Visit
Admission: RE | Admit: 2013-07-28 | Discharge: 2013-07-28 | Disposition: A | Discharge status: Home / Self Care | Payer: Medicare Other | Source: Ambulatory Visit | Attending: Internal Medicine | Admitting: Internal Medicine

## 2013-07-28 DIAGNOSIS — R413 Other amnesia: Secondary | ICD-10-CM

## 2013-07-31 ENCOUNTER — Ambulatory Visit: Payer: Medicare Other | Admitting: Thoracic Surgery (Cardiothoracic Vascular Surgery)

## 2013-08-01 ENCOUNTER — Other Ambulatory Visit: Payer: Self-pay | Admitting: Dermatology

## 2013-08-30 ENCOUNTER — Ambulatory Visit (INDEPENDENT_AMBULATORY_CARE_PROVIDER_SITE_OTHER): Payer: Medicare Other | Admitting: *Deleted

## 2013-08-30 DIAGNOSIS — Z7901 Long term (current) use of anticoagulants: Secondary | ICD-10-CM

## 2013-08-30 DIAGNOSIS — I4891 Unspecified atrial fibrillation: Secondary | ICD-10-CM

## 2013-09-04 ENCOUNTER — Other Ambulatory Visit: Payer: Self-pay | Admitting: Dermatology

## 2013-09-18 ENCOUNTER — Ambulatory Visit (INDEPENDENT_AMBULATORY_CARE_PROVIDER_SITE_OTHER): Payer: Medicare Other | Admitting: Thoracic Surgery (Cardiothoracic Vascular Surgery)

## 2013-09-18 ENCOUNTER — Encounter: Payer: Self-pay | Admitting: Thoracic Surgery (Cardiothoracic Vascular Surgery)

## 2013-09-18 VITALS — BP 155/89 | HR 78 | Resp 16 | Ht 70.0 in | Wt 160.0 lb

## 2013-09-18 DIAGNOSIS — I4891 Unspecified atrial fibrillation: Secondary | ICD-10-CM

## 2013-09-18 DIAGNOSIS — I059 Rheumatic mitral valve disease, unspecified: Secondary | ICD-10-CM

## 2013-09-18 DIAGNOSIS — Z8679 Personal history of other diseases of the circulatory system: Secondary | ICD-10-CM

## 2013-09-18 DIAGNOSIS — I340 Nonrheumatic mitral (valve) insufficiency: Secondary | ICD-10-CM

## 2013-09-18 DIAGNOSIS — Z9889 Other specified postprocedural states: Secondary | ICD-10-CM

## 2013-09-18 NOTE — Progress Notes (Signed)
[address]       [place] [address]             [phone]     CARDIOTHORACIC SURGERY OFFICE NOTE  Referring Provider is Sueanne Margarita, MD PCP is Irven Shelling, MD   HPI:  Patient returns for routine followup and surveillance status post minimally invasive mitral valve repair and Maze procedure 03/15/2013.  He was last seen here in our office 07/27/2013. He reports that he has not yet seen Dr. Radford Pax in followup and he apparently has not had a followup echocardiogram performed since his surgery. He remains on Coumadin and he has not had any problems related to Coumadin therapy. He denies any tachypalpitations. He reports no shortness of breath. He is physically active although he complains that he is now concerned about some vague soreness in his right groin that seems to be exacerbated when he is playing golf.   Current Outpatient Prescriptions  Medication Sig Dispense Refill  . cetirizine (ZYRTEC) 10 MG tablet Take 10 mg by mouth as needed for allergies.      . cholecalciferol (VITAMIN D) 1000 UNITS tablet Take 2,000 Units by mouth every morning.       . Colesevelam HCl (WELCHOL) 3.75 G PACK Take 1 packet by mouth every evening.      . furosemide (LASIX) 20 MG tablet Take 20 mg by mouth daily.      . Glucosamine-Chondroit-Vit C-Mn (GLUCOSAMINE CHONDR 500 COMPLEX) CAPS Take 2 capsules by mouth daily.       . potassium chloride SA (K-DUR,KLOR-CON) 20 MEQ tablet Take 1 tablet (20 mEq total) by mouth daily.  30 tablet  1  . warfarin (COUMADIN) 5 MG tablet Take 1.25-2.5 mg by mouth daily. 1.25mg  on Tues,Thurs, Sat, Sun. And 2.5mg  on Mon ,Wed, Fri, all other days..* OR AS DIRECTED       No current facility-administered medications for this visit.      Physical Exam:   BP 155/89  Pulse 78  Resp 16  Ht 5\' 10"  (1.778 m)  Wt 160 lb (72.576 kg)  BMI 22.96 kg/m2  SpO2 99%  General:  Well-appearing  Chest:   Clear to auscultation  CV:   Regular rate  and rhythm without murmur  Incisions:  Completely healed  Abdomen:  Soft and nontender  Extremities:  Warm and well-perfused. I do not appreciate an inguinal hernia or other concerning findings on physical exam of the groin.    Diagnostic Tests:  2 channel telemetry rhythm strip demonstrates normal sinus rhythm   Impression:  Patient is clinically doing quite well more than 6 months following minimally invasive mitral valve repair and Maze procedure. He is maintaining sinus rhythm. He does not have a heart murmur on exam. He apparently has not yet had an echocardiogram performed since surgery.  He is now complaining of some vague discomfort in the right groin that seems to bother him when he tries to play golf. He did not have a surgical incision in the groin as we used right axillary artery cannulation for surgery. I cannot appreciate an internal hernia or other explanation for his symptoms.  The patient clearly suffers from mild dementia and this might potentially adversely effect risks associated with long-term anticoagulation using Coumadin.    Plan:  We will make certain that a followup echocardiogram is performed and the patient returns for routine followup with Dr. Radford Pax. We will plan to see him back in 6  months for routine followup and rhythm check.   Valentina Gu. Roxy Manns, MD 09/18/2013 12:51 PM

## 2013-09-19 ENCOUNTER — Other Ambulatory Visit: Payer: Self-pay | Admitting: *Deleted

## 2013-09-19 DIAGNOSIS — I059 Rheumatic mitral valve disease, unspecified: Secondary | ICD-10-CM

## 2013-09-19 DIAGNOSIS — I4891 Unspecified atrial fibrillation: Secondary | ICD-10-CM

## 2013-09-28 ENCOUNTER — Other Ambulatory Visit: Payer: Self-pay

## 2013-10-04 ENCOUNTER — Encounter: Payer: Self-pay | Admitting: Cardiology

## 2013-10-06 ENCOUNTER — Ambulatory Visit (INDEPENDENT_AMBULATORY_CARE_PROVIDER_SITE_OTHER): Payer: Medicare Other | Admitting: Cardiology

## 2013-10-06 ENCOUNTER — Ambulatory Visit (HOSPITAL_COMMUNITY): Payer: Medicare Other | Attending: Thoracic Surgery (Cardiothoracic Vascular Surgery) | Admitting: Radiology

## 2013-10-06 ENCOUNTER — Encounter: Payer: Self-pay | Admitting: Cardiology

## 2013-10-06 ENCOUNTER — Other Ambulatory Visit (HOSPITAL_COMMUNITY): Payer: Self-pay | Admitting: Thoracic Surgery (Cardiothoracic Vascular Surgery)

## 2013-10-06 ENCOUNTER — Ambulatory Visit (INDEPENDENT_AMBULATORY_CARE_PROVIDER_SITE_OTHER): Payer: Medicare Other | Admitting: Pharmacist

## 2013-10-06 VITALS — BP 157/79 | HR 63 | Ht 70.0 in | Wt 166.0 lb

## 2013-10-06 DIAGNOSIS — Z87891 Personal history of nicotine dependence: Secondary | ICD-10-CM | POA: Insufficient documentation

## 2013-10-06 DIAGNOSIS — I059 Rheumatic mitral valve disease, unspecified: Secondary | ICD-10-CM

## 2013-10-06 DIAGNOSIS — Z8679 Personal history of other diseases of the circulatory system: Secondary | ICD-10-CM

## 2013-10-06 DIAGNOSIS — I509 Heart failure, unspecified: Secondary | ICD-10-CM

## 2013-10-06 DIAGNOSIS — I4891 Unspecified atrial fibrillation: Secondary | ICD-10-CM

## 2013-10-06 DIAGNOSIS — I379 Nonrheumatic pulmonary valve disorder, unspecified: Secondary | ICD-10-CM | POA: Insufficient documentation

## 2013-10-06 DIAGNOSIS — I079 Rheumatic tricuspid valve disease, unspecified: Secondary | ICD-10-CM | POA: Insufficient documentation

## 2013-10-06 DIAGNOSIS — Z9889 Other specified postprocedural states: Secondary | ICD-10-CM

## 2013-10-06 DIAGNOSIS — I2510 Atherosclerotic heart disease of native coronary artery without angina pectoris: Secondary | ICD-10-CM | POA: Insufficient documentation

## 2013-10-06 DIAGNOSIS — Z7901 Long term (current) use of anticoagulants: Secondary | ICD-10-CM

## 2013-10-06 DIAGNOSIS — G4733 Obstructive sleep apnea (adult) (pediatric): Secondary | ICD-10-CM | POA: Insufficient documentation

## 2013-10-06 DIAGNOSIS — I5032 Chronic diastolic (congestive) heart failure: Secondary | ICD-10-CM

## 2013-10-06 DIAGNOSIS — I714 Abdominal aortic aneurysm, without rupture, unspecified: Secondary | ICD-10-CM | POA: Insufficient documentation

## 2013-10-06 DIAGNOSIS — I340 Nonrheumatic mitral (valve) insufficiency: Secondary | ICD-10-CM

## 2013-10-06 DIAGNOSIS — I341 Nonrheumatic mitral (valve) prolapse: Secondary | ICD-10-CM

## 2013-10-06 LAB — BASIC METABOLIC PANEL
CO2: 30 mEq/L (ref 19–32)
Calcium: 9.7 mg/dL (ref 8.4–10.5)
Creatinine, Ser: 1.5 mg/dL (ref 0.4–1.5)
Glucose, Bld: 83 mg/dL (ref 70–99)

## 2013-10-06 LAB — POCT INR: INR: 3.4

## 2013-10-06 NOTE — Progress Notes (Signed)
[facility], [address], [place]  [address] Phone: [phone] Fax:  [phone]  Date:  10/06/2013   ID:  IZSAK HARTUNIAN, DOB 1929/04/18, MRN [id number]  PCP:  Irven Shelling, MD  Cardiologist:  Fransico Him, MD     History of Present Illness: [name] is a 79 y.o. male with a history of MV repair for MVP/MR and MAZE procedure, atrial fibrillation, chronic LE edema who presents today for followup.  He is doing well.  He denies any chest pain, SOB, DOE, palpitations or syncope.  He has some mild dizziness occasionally when going from sitting to standing or if he spends a long time looking at something and then moves he may be dizzy.  He has chronic LLE edema.   Wt Readings from Last 3 Encounters:  10/06/13 166 lb (75.297 kg)  09/18/13 160 lb (72.576 kg)  07/27/13 158 lb (71.668 kg)     Past Medical History  Diagnosis Date  . Heart palpitations     PT STATES NOT REALLY A BIG PROBLEM--DENIES CHEST PAINS  . Insomnia   . BPH (benign prostatic hypertrophy)   . Constipation   . History of torn meniscus of left knee     PAINFUL LEFT KNEE  . Hearing loss     BILATERAL - WEARS HEARING AIDS  . Mitral valve prolapse   . Mitral valve regurgitation   . OSA on CPAP   . Erectile dysfunction   . CHF (congestive heart failure)   . Dysrhythmia     a fib  . Cancer     SKIN CANCERS  . Arthritis     HANDS  . Atrial fibrillation 02/10/2013  . 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 AAA (abdominal aortic aneurysm) without rupture 03/03/2013    Small, partially thrombosed saccular aneurysm  . S/P Maze operation for atrial fibrillation 03/15/2013    Complete bilateral lesion set using cryothermy and bipolar radiofrequency ablation with oversewing of LA appendage via right mini thoracotomy  . Chronic diastolic congestive heart failure 03/20/2013  . Hyperlipidemia     statin intolerance  . Hyperlipidemia     Current Outpatient Prescriptions  Medication Sig  Dispense Refill  . cetirizine (ZYRTEC) 10 MG tablet Take 10 mg by mouth as needed for allergies.      . cholecalciferol (VITAMIN D) 1000 UNITS tablet Take 2,000 Units by mouth every morning.       . furosemide (LASIX) 20 MG tablet Take 20 mg by mouth daily.      . Glucosamine-Chondroit-Vit C-Mn (GLUCOSAMINE CHONDR 500 COMPLEX) CAPS Take 2 capsules by mouth daily.       . potassium chloride SA (K-DUR,KLOR-CON) 20 MEQ tablet Take 1 tablet (20 mEq total) by mouth daily.  30 tablet  1  . pravastatin (PRAVACHOL) 10 MG tablet Take 10 mg by mouth daily.      Marland Kitchen warfarin (COUMADIN) 5 MG tablet Take 1.25-2.5 mg by mouth daily. 1.25mg  on Tues,Thurs, Sat, Sun. And 2.5mg  on Mon ,Wed, Fri, all other days..* OR AS DIRECTED       No current facility-administered medications for this visit.    Allergies:    Allergies  Allergen Reactions  . Statins     Muscle pains  . Latex Rash    bandaids--no other latex problems    Social History:  The patient  reports that he has quit smoking. He started smoking about 52 years ago. He does not have any  smokeless tobacco history on file. He reports that he drinks about 3.5 ounces of alcohol per week. He reports that he does not use illicit drugs.   Family History:  The patient's family history includes Alzheimer's disease in his mother; Arthritis in his mother; Congestive Heart Failure in his mother; Coronary artery disease in his father; Diabetes in his mother; Heart attack in his father; Heart disease in his father.   ROS:  Please see the history of present illness.      All other systems reviewed and negative.   PHYSICAL EXAM: VS:  BP 157/79  Pulse 63  Ht 5\' 10"  (1.778 m)  Wt 166 lb (75.297 kg)  BMI 23.82 kg/m2 Well nourished, well developed, in no acute distress HEENT: normal Neck: no JVD Cardiac:  normal S1, S2; RRR; no murmur Lungs:  clear to auscultation bilaterally, no wheezing, rhonchi or rales Abd: soft, nontender, no hepatomegaly Ext: no  edema Skin: warm and dry Neuro:  CNs 2-12 intact, no focal abnormalities noted       ASSESSMENT AND PLAN:  1. MVP with severe MR s/p MV repair 2. Afib s/p MAZE - maintaining NSR  - continue Warfarin 3. Chronic LE edema with chronic diastolic CHF  - continue Lasix/potassium  - check BMET 4.   Elevated BP  - I have asked him to check his BP daily for a week and call with the results Followup with me in 6 months  Signed, Fransico Him, MD 10/06/2013 10:12 AM

## 2013-10-06 NOTE — Patient Instructions (Addendum)
Your physician recommends that you continue on your current medications as directed. Please refer to the Current Medication list given to you today.  Your physician recommends that you go to the lab today for a BMET  Check BP daily for a week and call with the results  Your physician wants you to follow-up in: 6 Months with Dr. Mallie Snooks will receive a reminder letter in the mail two months in advance. If you don't receive a letter, please call our office to schedule the follow-up appointment.

## 2013-10-06 NOTE — Progress Notes (Signed)
Echocardiogram performed.  

## 2013-10-26 ENCOUNTER — Ambulatory Visit (INDEPENDENT_AMBULATORY_CARE_PROVIDER_SITE_OTHER): Payer: Medicare Other | Admitting: *Deleted

## 2013-10-26 DIAGNOSIS — Z7901 Long term (current) use of anticoagulants: Secondary | ICD-10-CM

## 2013-10-26 DIAGNOSIS — I4891 Unspecified atrial fibrillation: Secondary | ICD-10-CM

## 2013-10-26 LAB — POCT INR: INR: 1.6

## 2013-11-09 ENCOUNTER — Ambulatory Visit (INDEPENDENT_AMBULATORY_CARE_PROVIDER_SITE_OTHER): Payer: Medicare Other | Admitting: Pharmacist

## 2013-11-09 DIAGNOSIS — I4891 Unspecified atrial fibrillation: Secondary | ICD-10-CM

## 2013-11-09 DIAGNOSIS — Z7901 Long term (current) use of anticoagulants: Secondary | ICD-10-CM

## 2013-11-22 ENCOUNTER — Other Ambulatory Visit: Payer: Self-pay | Admitting: Physician Assistant

## 2013-11-28 ENCOUNTER — Ambulatory Visit (INDEPENDENT_AMBULATORY_CARE_PROVIDER_SITE_OTHER): Payer: Medicare Other | Admitting: *Deleted

## 2013-11-28 DIAGNOSIS — Z7901 Long term (current) use of anticoagulants: Secondary | ICD-10-CM

## 2013-11-28 DIAGNOSIS — I4891 Unspecified atrial fibrillation: Secondary | ICD-10-CM

## 2013-11-28 LAB — POCT INR: INR: 1.7

## 2013-12-12 ENCOUNTER — Ambulatory Visit (INDEPENDENT_AMBULATORY_CARE_PROVIDER_SITE_OTHER): Payer: Medicare Other | Admitting: Pharmacist

## 2013-12-12 DIAGNOSIS — I4891 Unspecified atrial fibrillation: Secondary | ICD-10-CM

## 2013-12-12 DIAGNOSIS — Z7901 Long term (current) use of anticoagulants: Secondary | ICD-10-CM

## 2013-12-12 LAB — POCT INR: INR: 1.9

## 2013-12-26 ENCOUNTER — Ambulatory Visit (INDEPENDENT_AMBULATORY_CARE_PROVIDER_SITE_OTHER): Payer: Medicare Other | Admitting: Pharmacist

## 2013-12-26 DIAGNOSIS — I4891 Unspecified atrial fibrillation: Secondary | ICD-10-CM

## 2013-12-26 DIAGNOSIS — Z5181 Encounter for therapeutic drug level monitoring: Secondary | ICD-10-CM | POA: Insufficient documentation

## 2013-12-26 DIAGNOSIS — Z7901 Long term (current) use of anticoagulants: Secondary | ICD-10-CM

## 2013-12-26 LAB — POCT INR: INR: 2.6

## 2013-12-28 ENCOUNTER — Other Ambulatory Visit: Payer: Self-pay | Admitting: Cardiology

## 2014-01-03 ENCOUNTER — Emergency Department (INDEPENDENT_AMBULATORY_CARE_PROVIDER_SITE_OTHER)
Admission: EM | Admit: 2014-01-03 | Discharge: 2014-01-03 | Disposition: A | Discharge status: Home / Self Care | Payer: Medicare Other | Source: Home / Self Care | Attending: Family Medicine | Admitting: Family Medicine

## 2014-01-03 DIAGNOSIS — S81812A Laceration without foreign body, left lower leg, initial encounter: Secondary | ICD-10-CM

## 2014-01-03 DIAGNOSIS — S91009A Unspecified open wound, unspecified ankle, initial encounter: Secondary | ICD-10-CM

## 2014-01-03 DIAGNOSIS — S81809A Unspecified open wound, unspecified lower leg, initial encounter: Secondary | ICD-10-CM

## 2014-01-03 DIAGNOSIS — S81009A Unspecified open wound, unspecified knee, initial encounter: Secondary | ICD-10-CM

## 2014-01-03 DIAGNOSIS — Z7901 Long term (current) use of anticoagulants: Secondary | ICD-10-CM

## 2014-01-03 LAB — PROTIME-INR
INR: 2.4 — ABNORMAL HIGH (ref 0.00–1.49)
PROTHROMBIN TIME: 25.4 s — AB (ref 11.6–15.2)

## 2014-01-03 NOTE — ED Provider Notes (Signed)
[name] is a 80 y.o. male who presents to Urgent Care today for left leg skin tear. Patient had a ladder fall onto his left anterior shin yesterday. This caused a skin tear. The wound was cleaned and dressed with antibiotic ointment and sterile dressing. He is currently on anticoagulation secondary to valve repair. He had significant oozing following dressing. He is here today for evaluation. The bleeding has stopped. He denies any significant leg pain weakness or numbness. He feels well otherwise. No syncope nausea vomiting or diarrhea or chest pain.  She notes that he took 2 g of amoxicillin yesterday prior to dental work preceding his fall and bleeding.   Past Medical History  Diagnosis Date  . Heart palpitations     PT STATES NOT REALLY A BIG PROBLEM--DENIES CHEST PAINS  . Insomnia   . BPH (benign prostatic hypertrophy)   . Constipation   . History of torn meniscus of left knee     PAINFUL LEFT KNEE  . Hearing loss     BILATERAL - WEARS HEARING AIDS  . Mitral valve prolapse   . Mitral valve regurgitation   . OSA on CPAP   . Erectile dysfunction   . CHF (congestive heart failure)   . Dysrhythmia     a fib  . Cancer     SKIN CANCERS  . Arthritis     HANDS  . Atrial fibrillation 02/10/2013  . 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 AAA (abdominal aortic aneurysm) without rupture 03/03/2013    Small, partially thrombosed saccular aneurysm  . S/P Maze operation for atrial fibrillation 03/15/2013    Complete bilateral lesion set using cryothermy and bipolar radiofrequency ablation with oversewing of LA appendage via right mini thoracotomy  . Chronic diastolic congestive heart failure 03/20/2013  . Hyperlipidemia     statin intolerance  . Hyperlipidemia    History  Substance Use Topics  . Smoking status: Former Smoker -- 1.00 packs/day for 20 years    Start date: 11/23/1960  . Smokeless tobacco: Not on file     Comment: [place]  . Alcohol Use: 3.5 oz/week     7 drink(s) per week     Comment: COCKTAIL AND GLASS OF WINE WITH DINNER -DAILY   ROS as above Medications: No current facility-administered medications for this encounter.   Current Outpatient Prescriptions  Medication Sig Dispense Refill  . cetirizine (ZYRTEC) 10 MG tablet Take 10 mg by mouth as needed for allergies.      . cholecalciferol (VITAMIN D) 1000 UNITS tablet Take 2,000 Units by mouth every morning.       . furosemide (LASIX) 20 MG tablet Take 20 mg by mouth daily.      . Glucosamine-Chondroit-Vit C-Mn (GLUCOSAMINE CHONDR 500 COMPLEX) CAPS Take 2 capsules by mouth daily.       . potassium chloride SA (K-DUR,KLOR-CON) 20 MEQ tablet Take 1 tablet (20 mEq total) by mouth daily.  30 tablet  1  . potassium chloride SA (K-DUR,KLOR-CON) 20 MEQ tablet TAKE 1 TABLET BY MOUTH ONCE DAILY.  30 tablet  3  . pravastatin (PRAVACHOL) 10 MG tablet Take 10 mg by mouth daily.      Marland Kitchen warfarin (COUMADIN) 5 MG tablet Take 1.25-2.5 mg by mouth daily. 1.25mg  on Tues,Thurs, Sat, Sun. And 2.5mg  on Mon ,Wed, Fri, all other days..* OR AS DIRECTED        Exam:  T 97.3 Hr 88, rr 18, BP 147/82, Sat 95%  Gen: Well NAD HEENT: EOMI,  MMM Lungs: Normal work of breathing. CTABL Heart: RRR no MRG Abd: NABS, Soft. NT, ND Exts:  warm and well perfused.  Left leg skin: Adhered tefla dressing overlying skin tear with dry blood. No bleeding. Pulses are slightly diminished but present in the left dorsal pedis compared to the right. Brisk capillary refill and sensation and motion are intact distally bilaterally  No results found for this or any previous visit (from the past 24 hour(s)). No results found.  Assessment and Plan: 80 y.o. male with skin tear. I did not remove the dressing as the patient had significant oozing yesterday. I am concerned he will have continued bleeding if I tear the dressing off of this wound.  I will obtain an INR to check his anticoagulation status. Call patient with results.   Return or followup with primary care provider in 3-4 days for dressing change.  Discussed warning signs or symptoms. Please see discharge instructions. Patient expresses understanding.    Gregor Hams, MD 01/03/14 1037

## 2014-01-03 NOTE — Discharge Instructions (Signed)
Thank you for coming in today. I will call you with your lab results if they are very abnormal.  Return or followup with your primary care doctor in 3-4 days to remove the dressing It is okay if the dressing falls off on its own.  Skin Tear Care A skin tear is a wound in which the top layer of skin has peeled off. This is a common problem with aging because the skin becomes thinner and more fragile as a person gets older. In addition, some medicines, such as oral corticosteroids, can lead to skin thinning if taken for long periods of time.  A skin tear is often repaired with tape or skin adhesive strips. This keeps the skin that has been peeled off in contact with the healthier skin beneath. Depending on the location of the wound, a bandage (dressing) may be applied over the tape or skin adhesive strips. Sometimes, during the healing process, the skin turns black and dies. Even when this happens, the torn skin acts as a good dressing until the skin underneath gets healthier and repairs itself. HOME CARE INSTRUCTIONS   Change dressings once per day or as directed by your caregiver.  Gently clean the skin tear and the area around the tear using saline solution or mild soap and water.  Do not rub the injured skin dry. Let the area air dry.  Apply petroleum jelly or an antibiotic cream or ointment to keep the tear moist. This will help the wound heal. Do not allow a scab to form.  If the dressing sticks before the next dressing change, moisten it with warm soapy water and gently remove it.  Protect the injured skin until it has healed.  Only take over-the-counter or prescription medicines as directed by your caregiver.  Take showers or baths using warm soapy water. Apply a new dressing after the shower or bath.  Keep all follow-up appointments as directed by your caregiver.  SEEK IMMEDIATE MEDICAL CARE IF:   You have redness, swelling, or increasing pain in the skin tear.  You havepus  coming from the skin tear.  You have chills.  You have a red streak that goes away from the skin tear.  You have a bad smell coming from the tear or dressing.  You have a fever or persistent symptoms for more than 2 3 days.  You have a fever and your symptoms suddenly get worse. MAKE SURE YOU:  Understand these instructions.  Will watch this condition.  Will get help right away if your child is not doing well or gets worse. Document Released: 08/04/2001 Document Revised: 08/03/2012 Document Reviewed: 05/23/2012 [facility] Patient Information 2014 [place].

## 2014-01-04 ENCOUNTER — Encounter (HOSPITAL_COMMUNITY): Payer: Self-pay | Admitting: Emergency Medicine

## 2014-01-04 ENCOUNTER — Other Ambulatory Visit: Payer: Self-pay | Admitting: Dermatology

## 2014-01-04 NOTE — ED Notes (Signed)
Late entry.  computer system down at the time of patient visit.  Patient was on a ladder 2/10.  Coming down ladder, ladder shifted and patient began to fall.  Patient jumped off ladder -no problem.  Ladder then fell striking left lower leg.  injury around 1:30 pm on 2/10.  Family had applied pressure and elevated leg.  Bleeding continued.  Applied polysporin, non stick guaze pad, and wrapped with ace wrap to keep pressure on wound.  Patient has an apt with dr Allyson Sabal on 2/12 for a separate issue on this leg.  Last tetanus 02/2013.  At this visit: visible swelling above and below ace wrap.  Removed ace wrap.  Guaze piece dried to wound and did not remove.  Both feet same temp to touch.  Right dp bounding, strong.  Left dp faint, dopplerable.  notified dr Georgina Snell of patient and variant in pulse assessment.  Color, movement, temperature equal between both feet.

## 2014-01-04 NOTE — ED Notes (Signed)
Late entry.  Secured intact guaze with kerlix and secured with tape.  No bleeding from wound during this visit. Dressing as instructed by dr Georgina Snell.

## 2014-01-23 ENCOUNTER — Ambulatory Visit (INDEPENDENT_AMBULATORY_CARE_PROVIDER_SITE_OTHER): Payer: Medicare Other | Admitting: Pharmacist

## 2014-01-23 DIAGNOSIS — Z5181 Encounter for therapeutic drug level monitoring: Secondary | ICD-10-CM

## 2014-01-23 DIAGNOSIS — I4891 Unspecified atrial fibrillation: Secondary | ICD-10-CM

## 2014-01-23 DIAGNOSIS — Z7901 Long term (current) use of anticoagulants: Secondary | ICD-10-CM

## 2014-01-23 LAB — POCT INR: INR: 3

## 2014-02-20 ENCOUNTER — Ambulatory Visit (INDEPENDENT_AMBULATORY_CARE_PROVIDER_SITE_OTHER): Payer: Medicare Other

## 2014-02-20 ENCOUNTER — Other Ambulatory Visit: Payer: Self-pay | Admitting: Cardiology

## 2014-02-20 DIAGNOSIS — Z7901 Long term (current) use of anticoagulants: Secondary | ICD-10-CM

## 2014-02-20 DIAGNOSIS — I4891 Unspecified atrial fibrillation: Secondary | ICD-10-CM

## 2014-02-20 DIAGNOSIS — Z5181 Encounter for therapeutic drug level monitoring: Secondary | ICD-10-CM

## 2014-02-20 LAB — POCT INR: INR: 2.4

## 2014-02-26 ENCOUNTER — Other Ambulatory Visit: Payer: Self-pay | Admitting: Cardiology

## 2014-02-27 NOTE — Telephone Encounter (Signed)
Is the patient taking one or two daily? Please advise. Thanks, MI

## 2014-03-19 ENCOUNTER — Ambulatory Visit: Payer: Medicare Other | Admitting: Thoracic Surgery (Cardiothoracic Vascular Surgery)

## 2014-03-19 ENCOUNTER — Ambulatory Visit (INDEPENDENT_AMBULATORY_CARE_PROVIDER_SITE_OTHER): Payer: Medicare Other | Admitting: Thoracic Surgery (Cardiothoracic Vascular Surgery)

## 2014-03-19 ENCOUNTER — Encounter: Payer: Self-pay | Admitting: Thoracic Surgery (Cardiothoracic Vascular Surgery)

## 2014-03-19 VITALS — BP 146/87 | HR 77 | Resp 16 | Ht 70.0 in | Wt 166.0 lb

## 2014-03-19 DIAGNOSIS — Z9889 Other specified postprocedural states: Secondary | ICD-10-CM

## 2014-03-19 DIAGNOSIS — I340 Nonrheumatic mitral (valve) insufficiency: Secondary | ICD-10-CM

## 2014-03-19 DIAGNOSIS — I059 Rheumatic mitral valve disease, unspecified: Secondary | ICD-10-CM

## 2014-03-19 DIAGNOSIS — I4891 Unspecified atrial fibrillation: Secondary | ICD-10-CM

## 2014-03-19 DIAGNOSIS — Z8679 Personal history of other diseases of the circulatory system: Secondary | ICD-10-CM

## 2014-03-19 NOTE — Progress Notes (Addendum)
[address]       [place] [address]             [phone]     CARDIOTHORACIC SURGERY OFFICE NOTE  Referring Provider is TURNER, Eber Hong, MD PCP is Irven Shelling, MD   HPI:  Patient returns for routine followup and surveillance status post minimally invasive mitral valve repair and Maze procedure 03/15/2013.  He was last seen here in our office 09/18/2013. Since then he has been seen in followup by Dr. Radford Pax who performed an echocardiogram on 10/06/2013. Followup echocardiogram looked good with normal left ventricular systolic function and intact mitral valve repair with only trace to mild residual mitral regurgitation. Clinically the patient has continued to do very well. He remains active physically and he reports no significant physical limitations with respect to his underlying heart disease. He specifically denies any symptoms of exertional shortness of breath or chest discomfort. He has not had any tachypalpitations or dizzy spells. He has remained in sinus rhythm. He remains chronically anticoagulated using Coumadin. He has not had any bleeding complications other than the fact that he injured his leg a few months ago and had to be seen in the emergency room because of considerable bleeding. He returns to the office today feeling quite well. He has no problems or complaints other than chronic arthritis in his left knee.    Current Outpatient Prescriptions  Medication Sig Dispense Refill  . cetirizine (ZYRTEC) 10 MG tablet Take 10 mg by mouth as needed for allergies.      . cholecalciferol (VITAMIN D) 1000 UNITS tablet Take 2,000 Units by mouth every morning.       . colesevelam (WELCHOL) 625 MG tablet Take 625 mg by mouth daily with breakfast.      . furosemide (LASIX) 20 MG tablet Take 1 tablet (20 mg total) by mouth daily.  30 tablet  11  . Glucosamine-Chondroit-Vit C-Mn (GLUCOSAMINE CHONDR 500 COMPLEX) CAPS Take 2 capsules by mouth daily.       .  potassium chloride (K-DUR) 10 MEQ tablet Take 20 mEq by mouth every other day.      . warfarin (COUMADIN) 5 MG tablet 1 tablet on all days except 1/2 tablet on Monday, Wednesday, and Friday or as directed by warfarin clinic  35 tablet  3   No current facility-administered medications for this visit.      Physical Exam:   BP 146/87  Pulse 77  Resp 16  Ht 5\' 10"  (1.778 m)  Wt 166 lb (75.297 kg)  BMI 23.82 kg/m2  SpO2 98%  General:  Well-appearing  Chest:   Clear  CV:   Regular rate and rhythm without murmur  Incisions:  Completely healed  Abdomen:  Soft  Extremities:  Warm and well-perfused with no lower extremity edema  Diagnostic Tests:  2 channel telemetry rhythm strip demonstrates normal sinus rhythm   Transthoracic Echocardiography  Patient: Raven, Theys MR #: [id number] Study Date: 10/06/2013 Gender: M Age: 80 Height: 182.9cm Weight: 72.7kg BSA: 1.10m^2 Pt. Status: Room:  PERFORMING Fransico Him, MD Andrena Mews MD REFERRING Darylene Price MD ATTENDING Default, Provider W SONOGRAPHER Cindy Hazy, RDCS cc:  ------------------------------------------------------------ LV EF: 55% - 60%  ------------------------------------------------------------ Indications: 424.0 Mitral valve disease.  ------------------------------------------------------------ History: PMH: Atrial Fibrillation. AAA. CAD. History of MVP. Risk factors: Former tobacco use.  ------------------------------------------------------------ Study Conclusions  - Left ventricle: The cavity size was normal. There was mild concentric hypertrophy. Systolic function  was normal. The estimated ejection fraction was in the range of 55% to 60%. Wall motion was normal; there were no regional wall motion abnormalities. Features are consistent with a pseudonormal left ventricular filling pattern, with concomitant abnormal relaxation and increased filling pressure (grade 2 diastolic  dysfunction). - Mitral valve: Prior procedures included surgical repair. Mild regurgitation. - Left atrium: The atrium was mildly dilated. - Right atrium: The atrium was mildly dilated. - Atrial septum: No defect or patent foramen ovale was identified. - Pulmonary arteries: PA peak pressure: 29mm Hg (S). Impressions:  - The right ventricular systolic pressure was increased consistent with mild pulmonary hypertension.  ------------------------------------------------------------ Labs, prior tests, procedures, and surgery: Status post Mitral Valve Repair. Transthoracic echocardiography. M-mode, complete 2D, spectral Doppler, and color Doppler. Height: Height: 182.9cm. Height: 72in. Weight: Weight: 72.7kg. Weight: 160lb. Body mass index: BMI: 21.7kg/m^2. Body surface area: BSA: 1.31m^2. Blood pressure: 155/89. Patient status: Outpatient. Location: [facility]  ------------------------------------------------------------  ------------------------------------------------------------ Left ventricle: The cavity size was normal. There was mild concentric hypertrophy. Systolic function was normal. The estimated ejection fraction was in the range of 55% to 60%. Wall motion was normal; there were no regional wall motion abnormalities. The transmitral flow pattern was normal. The tissue Doppler parameters were abnormal. Features are consistent with a pseudonormal left ventricular filling pattern, with concomitant abnormal relaxation and increased filling pressure (grade 2 diastolic dysfunction).  ------------------------------------------------------------ Aortic valve: Mildly thickened, mildly calcified leaflets. Cusp separation was normal. Doppler: Transvalvular velocity was within the normal range. There was no stenosis. No regurgitation.  ------------------------------------------------------------ Aorta: The aorta was normal, not dilated, and  non-diseased.  ------------------------------------------------------------ Mitral valve: Prior procedures included surgical repair. Doppler: Mild regurgitation. Valve area by pressure half-time: 2.04cm^2. Indexed valve area by pressure half-time: 1.06cm^2/m^2. Valve area by continuity equation (using LVOT flow): 1.29cm^2. Indexed valve area by continuity equation (using LVOT flow): 0.67cm^2/m^2. Mean gradient: 9mm Hg (D). Peak gradient: 63mm Hg (D).  ------------------------------------------------------------ Left atrium: The atrium was mildly dilated.  ------------------------------------------------------------ Atrial septum: No defect or patent foramen ovale was identified.  ------------------------------------------------------------ Right ventricle: The cavity size was normal. Wall thickness was normal. Systolic function was normal.  ------------------------------------------------------------ Pulmonic valve: Structurally normal valve. Cusp separation was normal. Doppler: Transvalvular velocity was within the normal range. Mild regurgitation.  ------------------------------------------------------------ Tricuspid valve: Structurally normal valve. Leaflet separation was normal. Doppler: Transvalvular velocity was within the normal range. Mild regurgitation.  ------------------------------------------------------------ Pulmonary artery: The main pulmonary artery was normal-sized.  ------------------------------------------------------------ Right atrium: The atrium was mildly dilated.  ------------------------------------------------------------ Pericardium: The pericardium was normal in appearance.  ------------------------------------------------------------ Systemic veins: Inferior vena cava: The vessel was dilated; the respirophasic diameter changes were blunted (< 50%); findings are consistent with elevated central  venous pressure.  ------------------------------------------------------------ Post procedure conclusions Ascending Aorta:  - The aorta was normal, not dilated, and non-diseased.  ------------------------------------------------------------  2D measurements Normal Doppler measurements Normal Left ventricle Main pulmonary LVID ED, 39.5 mm 43-52 artery chord, Pressure, 36 mm Hg =30 PLAX S LVID ES, 27 mm 23-38 Left ventricle chord, Ea, lat 5.95 cm/s ------ PLAX ann, tiss FS, chord, 32 % >29 DP PLAX E/Ea, lat 31.7 ------ LVPW, ED 13.8 mm ------ ann, tiss 6 IVS/LVPW 1.03 <1.3 DP ratio, ED Ea, med 6.24 cm/s ------ Ventricular septum ann, tiss IVS, ED 14.2 mm ------ DP LVOT E/Ea, med 30.2 ------ Diam, S 22 mm ------ ann, tiss 9 Area 3.8 cm^2 ------ DP Diam 22 mm ------ LVOT Aorta Peak vel, 79.4 cm/s ------ Root diam,  34 mm ------ S ED VTI, S 18.2 cm ------ Left atrium Stroke vol 69.2 ml ------ AP dim 43 mm ------ Stroke 36.1 ml/m^2 ------ AP dim 2.24 cm/m^2 <2.2 index index Mitral valve Peak E vel 189 cm/s ------ Mean vel, 126 cm/s ------ D Decelerati 352 ms 150-23 on time 0 Pressure 108 ms ------ half-time Mean 8 mm Hg ------ gradient, D Peak 22 mm Hg ------ gradient, D Area (PHT) 2.04 cm^2 ------ Area index 1.06 cm^2/m ------ (PHT) ^2 Area 1.29 cm^2 ------ (LVOT) continuity Area index 0.67 cm^2/m ------ (LVOT ^2 cont) Annulus 53.5 cm ------ VTI Tricuspid valve Regurg 279 cm/s ------ peak vel Peak RV-RA 31 mm Hg ------ gradient, S Systemic veins Estimated 5 mm Hg ------ CVP Right ventricle Pressure, 36 mm Hg <30 S Sa vel, 14 cm/s ------ lat ann, tiss DP  ------------------------------------------------------------ Prepared and Electronically Authenticated by  Fransico Him, MD 2014-11-14T10:43:08.510    Impression:  Patient is doing very well 1 year status post minimally invasive mitral valve repair and Maze procedure. He has normal  exercise tolerance without any symptoms or signs of congestive heart failure. He is maintaining sinus rhythm. Late followup echocardiogram looks good.  Plan:  Patient will return in one year for routine followup and rhythm check  I spent in excess of 15 minutes during the conduct of this office consultation and >50% of this time involved direct face-to-face encounter with the patient for counseling and/or coordination of their care.   Valentina Gu. Roxy Manns, MD 03/19/2014 10:39 AM

## 2014-03-19 NOTE — Patient Instructions (Signed)

## 2014-03-20 ENCOUNTER — Ambulatory Visit (INDEPENDENT_AMBULATORY_CARE_PROVIDER_SITE_OTHER): Payer: Medicare Other | Admitting: Pharmacist Clinician (PhC)/ Clinical Pharmacy Specialist

## 2014-03-20 DIAGNOSIS — Z7901 Long term (current) use of anticoagulants: Secondary | ICD-10-CM

## 2014-03-20 DIAGNOSIS — Z5181 Encounter for therapeutic drug level monitoring: Secondary | ICD-10-CM

## 2014-03-20 DIAGNOSIS — I4891 Unspecified atrial fibrillation: Secondary | ICD-10-CM

## 2014-03-20 LAB — POCT INR: INR: 3.7

## 2014-04-03 ENCOUNTER — Ambulatory Visit (INDEPENDENT_AMBULATORY_CARE_PROVIDER_SITE_OTHER): Payer: Medicare Other | Admitting: *Deleted

## 2014-04-03 DIAGNOSIS — I4891 Unspecified atrial fibrillation: Secondary | ICD-10-CM

## 2014-04-03 DIAGNOSIS — Z5181 Encounter for therapeutic drug level monitoring: Secondary | ICD-10-CM

## 2014-04-03 DIAGNOSIS — Z7901 Long term (current) use of anticoagulants: Secondary | ICD-10-CM

## 2014-04-03 LAB — POCT INR: INR: 2.3

## 2014-04-10 ENCOUNTER — Other Ambulatory Visit: Payer: Self-pay | Admitting: Dermatology

## 2014-04-17 ENCOUNTER — Ambulatory Visit (HOSPITAL_COMMUNITY)
Admission: RE | Admit: 2014-04-17 | Discharge: 2014-04-17 | Disposition: A | Discharge status: Home / Self Care | Payer: Medicare Other | Source: Ambulatory Visit | Attending: Internal Medicine | Admitting: Internal Medicine

## 2014-04-17 ENCOUNTER — Other Ambulatory Visit (HOSPITAL_COMMUNITY): Payer: Self-pay | Admitting: Internal Medicine

## 2014-04-17 ENCOUNTER — Other Ambulatory Visit: Payer: Self-pay | Admitting: Internal Medicine

## 2014-04-17 DIAGNOSIS — M7989 Other specified soft tissue disorders: Secondary | ICD-10-CM

## 2014-04-17 DIAGNOSIS — R609 Edema, unspecified: Secondary | ICD-10-CM

## 2014-04-17 NOTE — Progress Notes (Signed)
VASCULAR LAB PRELIMINARY  PRELIMINARY  PRELIMINARY  PRELIMINARY  Right lower extremity venous duplex completed.    Preliminary report:  Right:  No evidence of DVT, superficial thrombosis, or Baker's cyst.  Nani Ravens, RVT 04/17/2014, 1:09 PM

## 2014-04-24 ENCOUNTER — Ambulatory Visit (INDEPENDENT_AMBULATORY_CARE_PROVIDER_SITE_OTHER): Payer: Medicare Other | Admitting: Pharmacist Clinician (PhC)/ Clinical Pharmacy Specialist

## 2014-04-24 DIAGNOSIS — Z5181 Encounter for therapeutic drug level monitoring: Secondary | ICD-10-CM

## 2014-04-24 DIAGNOSIS — Z7901 Long term (current) use of anticoagulants: Secondary | ICD-10-CM

## 2014-04-24 DIAGNOSIS — I4891 Unspecified atrial fibrillation: Secondary | ICD-10-CM

## 2014-04-24 LAB — POCT INR: INR: 2.3

## 2014-05-01 ENCOUNTER — Emergency Department (HOSPITAL_COMMUNITY)
Admission: EM | Admit: 2014-05-01 | Discharge: 2014-05-01 | Disposition: A | Discharge status: Home / Self Care | Payer: Medicare Other | Attending: Emergency Medicine | Admitting: Emergency Medicine

## 2014-05-01 ENCOUNTER — Encounter (HOSPITAL_COMMUNITY): Payer: Self-pay | Admitting: Emergency Medicine

## 2014-05-01 ENCOUNTER — Emergency Department (HOSPITAL_COMMUNITY): Payer: Medicare Other

## 2014-05-01 DIAGNOSIS — M4804 Spinal stenosis, thoracic region: Secondary | ICD-10-CM | POA: Insufficient documentation

## 2014-05-01 DIAGNOSIS — E785 Hyperlipidemia, unspecified: Secondary | ICD-10-CM | POA: Insufficient documentation

## 2014-05-01 DIAGNOSIS — Z85828 Personal history of other malignant neoplasm of skin: Secondary | ICD-10-CM | POA: Insufficient documentation

## 2014-05-01 DIAGNOSIS — I059 Rheumatic mitral valve disease, unspecified: Secondary | ICD-10-CM | POA: Insufficient documentation

## 2014-05-01 DIAGNOSIS — I5032 Chronic diastolic (congestive) heart failure: Secondary | ICD-10-CM | POA: Insufficient documentation

## 2014-05-01 DIAGNOSIS — H9190 Unspecified hearing loss, unspecified ear: Secondary | ICD-10-CM | POA: Insufficient documentation

## 2014-05-01 DIAGNOSIS — Z87891 Personal history of nicotine dependence: Secondary | ICD-10-CM | POA: Insufficient documentation

## 2014-05-01 DIAGNOSIS — M129 Arthropathy, unspecified: Secondary | ICD-10-CM | POA: Insufficient documentation

## 2014-05-01 DIAGNOSIS — R002 Palpitations: Secondary | ICD-10-CM | POA: Insufficient documentation

## 2014-05-01 DIAGNOSIS — K280 Acute gastrojejunal ulcer with hemorrhage: Secondary | ICD-10-CM | POA: Insufficient documentation

## 2014-05-01 DIAGNOSIS — N400 Enlarged prostate without lower urinary tract symptoms: Secondary | ICD-10-CM | POA: Insufficient documentation

## 2014-05-01 DIAGNOSIS — Z8739 Personal history of other diseases of the musculoskeletal system and connective tissue: Secondary | ICD-10-CM | POA: Insufficient documentation

## 2014-05-01 DIAGNOSIS — I4891 Unspecified atrial fibrillation: Secondary | ICD-10-CM | POA: Insufficient documentation

## 2014-05-01 DIAGNOSIS — Z9889 Other specified postprocedural states: Secondary | ICD-10-CM | POA: Insufficient documentation

## 2014-05-01 DIAGNOSIS — N529 Male erectile dysfunction, unspecified: Secondary | ICD-10-CM | POA: Insufficient documentation

## 2014-05-01 DIAGNOSIS — M25469 Effusion, unspecified knee: Secondary | ICD-10-CM | POA: Insufficient documentation

## 2014-05-01 DIAGNOSIS — M25461 Effusion, right knee: Secondary | ICD-10-CM

## 2014-05-01 DIAGNOSIS — Z79899 Other long term (current) drug therapy: Secondary | ICD-10-CM | POA: Insufficient documentation

## 2014-05-01 DIAGNOSIS — I499 Cardiac arrhythmia, unspecified: Secondary | ICD-10-CM | POA: Insufficient documentation

## 2014-05-01 DIAGNOSIS — G4733 Obstructive sleep apnea (adult) (pediatric): Secondary | ICD-10-CM | POA: Insufficient documentation

## 2014-05-01 DIAGNOSIS — I714 Abdominal aortic aneurysm, without rupture, unspecified: Secondary | ICD-10-CM | POA: Insufficient documentation

## 2014-05-01 DIAGNOSIS — M25569 Pain in unspecified knee: Secondary | ICD-10-CM | POA: Insufficient documentation

## 2014-05-01 DIAGNOSIS — Z888 Allergy status to other drugs, medicaments and biological substances status: Secondary | ICD-10-CM | POA: Insufficient documentation

## 2014-05-01 DIAGNOSIS — Z91040 Latex allergy status: Secondary | ICD-10-CM | POA: Insufficient documentation

## 2014-05-01 DIAGNOSIS — I509 Heart failure, unspecified: Secondary | ICD-10-CM | POA: Insufficient documentation

## 2014-05-01 DIAGNOSIS — G4700 Insomnia, unspecified: Secondary | ICD-10-CM | POA: Insufficient documentation

## 2014-05-01 LAB — GRAM STAIN: SPECIAL REQUESTS: NORMAL

## 2014-05-01 LAB — SYNOVIAL CELL COUNT + DIFF, W/ CRYSTALS
CRYSTALS FLUID: NONE SEEN
Eosinophils-Synovial: 0 % (ref 0–1)
Lymphocytes-Synovial Fld: 0 % (ref 0–20)
Monocyte-Macrophage-Synovial Fluid: 7 % — ABNORMAL LOW (ref 50–90)
Neutrophil, Synovial: 93 % — ABNORMAL HIGH (ref 0–25)
WBC, SYNOVIAL: 26550 /mm3 — AB (ref 0–200)

## 2014-05-01 LAB — BASIC METABOLIC PANEL
BUN: 24 mg/dL — AB (ref 6–23)
CHLORIDE: 99 meq/L (ref 96–112)
CO2: 28 mEq/L (ref 19–32)
Calcium: 9.6 mg/dL (ref 8.4–10.5)
Creatinine, Ser: 1.41 mg/dL — ABNORMAL HIGH (ref 0.50–1.35)
GFR calc Af Amer: 52 mL/min — ABNORMAL LOW (ref >90)
GFR calc non Af Amer: 44 mL/min — ABNORMAL LOW (ref >90)
Glucose, Bld: 99 mg/dL (ref 70–99)
POTASSIUM: 4.8 meq/L (ref 3.7–5.3)
Sodium: 137 mEq/L (ref 137–147)

## 2014-05-01 LAB — CBC
HCT: 39.2 % (ref 39.0–52.0)
Hemoglobin: 12.9 g/dL — ABNORMAL LOW (ref 13.0–17.0)
MCH: 30.4 pg (ref 26.0–34.0)
MCHC: 32.9 g/dL (ref 30.0–36.0)
MCV: 92.5 fL (ref 78.0–100.0)
Platelets: 215 10*3/uL (ref 150–400)
RBC: 4.24 MIL/uL (ref 4.22–5.81)
RDW: 12.6 % (ref 11.5–15.5)
WBC: 9.2 10*3/uL (ref 4.0–10.5)

## 2014-05-01 LAB — SEDIMENTATION RATE: Sed Rate: 30 mm/hr — ABNORMAL HIGH (ref 0–16)

## 2014-05-01 MED ORDER — HYDROCODONE-ACETAMINOPHEN 5-325 MG PO TABS
2.0000 | ORAL_TABLET | Freq: Once | ORAL | Status: AC
  Administered 2014-05-01: 2 via ORAL
  Filled 2014-05-01: qty 2

## 2014-05-01 MED ORDER — HYDROCODONE-ACETAMINOPHEN 5-325 MG PO TABS: 1.0000 | ORAL_TABLET | Freq: Four times a day (QID) | ORAL | Status: DC | PRN

## 2014-05-01 NOTE — ED Notes (Signed)
Pt and family verbalize understanding of dc instructions and deny any further needs at this time 

## 2014-05-01 NOTE — ED Notes (Signed)
Pt presents with Right knee pain starting approx 0900 this am, pt sttaes his pain started posterior knee just below his knee. Pt recently developed BLE edema 3 weeks ago, his PCP increased his lasix at that time and instructed pt to stay off his feet. Pt usually plays golf 3 days a week but has not played in the last 3 weeks until yesterday. Pt denies SOB, chest pain, nausea, vomiting, or diaphoresis. Pt does present with swelling to BLE and Right knee

## 2014-05-01 NOTE — Discharge Instructions (Signed)

## 2014-05-01 NOTE — ED Notes (Signed)
Pt. Had run of tachycardia, ekg strip printed and given to Dr. Mingo Amber. Pt. Denies complaints. HR 85 NSR at this time.

## 2014-05-01 NOTE — ED Provider Notes (Addendum)
CSN: [id number]     Arrival date & time 05/01/14  1538 History   First MD Initiated Contact with Patient 05/01/14 1750     Chief Complaint  Patient presents with  . Knee Pain     (Consider location/radiation/quality/duration/timing/severity/associated sxs/prior Treatment) Patient is a 80 y.o. male presenting with knee pain. The history is provided by the patient.  Knee Pain Location:  Knee Injury: no   Knee location:  R knee Pain details:    Quality:  Aching   Radiates to:  Does not radiate   Severity:  Moderate   Timing:  Constant   Progression:  Worsening Chronicity:  New Dislocation: no   Relieved by:  Nothing Worsened by:  Bearing weight Associated symptoms: no fever     Past Medical History  Diagnosis Date  . Heart palpitations     PT STATES NOT REALLY A BIG PROBLEM--DENIES CHEST PAINS  . Insomnia   . BPH (benign prostatic hypertrophy)   . Constipation   . History of torn meniscus of left knee     PAINFUL LEFT KNEE  . Hearing loss     BILATERAL - WEARS HEARING AIDS  . Mitral valve prolapse   . Mitral valve regurgitation   . OSA on CPAP   . Erectile dysfunction   . CHF (congestive heart failure)   . Dysrhythmia     a fib  . Cancer     SKIN CANCERS  . Arthritis     HANDS  . Atrial fibrillation 02/10/2013  . 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 AAA (abdominal aortic aneurysm) without rupture 03/03/2013    Small, partially thrombosed saccular aneurysm  . S/P Maze operation for atrial fibrillation 03/15/2013    Complete bilateral lesion set using cryothermy and bipolar radiofrequency ablation with oversewing of LA appendage via right mini thoracotomy  . Chronic diastolic congestive heart failure 03/20/2013  . Hyperlipidemia     statin intolerance  . Hyperlipidemia    Past Surgical History  Procedure Laterality Date  . Skin cancers removed from left forehead and left nose    . Skin cancers removed from both legs    . Surgery left neck for removal  of metastatic lymph node ( from cancer left forehead)  and left neck dissection-rest of lymph nodes negative for any cancer    . Carpal tunnel release      BILATERAL  . Right rotator cuff repair    . Tee without cardioversion N/A 01/31/2013    Procedure: TRANSESOPHAGEAL ECHOCARDIOGRAM (TEE);  Surgeon: Sueanne Margarita, MD;  Location: [facility];  Service: Cardiovascular;  Laterality: N/A;  . Hemorrhoid surgery      1973  . Mitral valve repair Right 03/15/2013    Procedure: MINIMALLY INVASIVE MITRAL VALVE REPAIR (MVR);  Surgeon: Rexene Alberts, MD;  Location: [facility];  Service: Open Heart Surgery;  Laterality: Right;  . Minimally invasive maze procedure Right 03/15/2013    Procedure: MINIMALLY INVASIVE MAZE PROCEDURE;  Surgeon: Rexene Alberts, MD;  Location: [facility];  Service: Open Heart Surgery;  Laterality: Right;  (R) AXILLARY CANNULATION  . Intraoperative transesophageal echocardiogram Right 03/15/2013    Procedure: INTRAOPERATIVE TRANSESOPHAGEAL ECHOCARDIOGRAM;  Surgeon: Rexene Alberts, MD;  Location: [facility];  Service: Open Heart Surgery;  Laterality: Right;   Family History  Problem Relation Age of Onset  . Coronary artery disease Father   . Heart disease Father   . Heart attack Father   . Diabetes Mother   .  Arthritis Mother   . Congestive Heart Failure Mother   . Alzheimer's disease Mother    History  Substance Use Topics  . Smoking status: Former Smoker -- 1.00 packs/day for 20 years    Start date: 11/23/1960  . Smokeless tobacco: Not on file     Comment: [place]  . Alcohol Use: 3.5 oz/week    7 drink(s) per week     Comment: COCKTAIL AND GLASS OF WINE WITH DINNER -DAILY    Review of Systems  Constitutional: Positive for chills. Negative for fever.  Respiratory: Negative for cough and shortness of breath.   Gastrointestinal: Negative for vomiting.  All other systems reviewed and are negative.     Allergies  Statins and Latex  Home Medications   Prior to  Admission medications   Medication Sig Start Date End Date Taking? Authorizing Provider  cetirizine (ZYRTEC) 10 MG tablet Take 10 mg by mouth as needed for allergies.   Yes Historical Provider, MD  cholecalciferol (VITAMIN D) 1000 UNITS tablet Take 1,000 Units by mouth every morning.    Yes Historical Provider, MD  colesevelam (WELCHOL) 625 MG tablet Take 625 mg by mouth at bedtime.    Yes Historical Provider, MD  furosemide (LASIX) 20 MG tablet Take 1 tablet (20 mg total) by mouth daily.   Yes Sueanne Margarita, MD  Glucosamine-Chondroit-Vit C-Mn (GLUCOSAMINE CHONDR 500 COMPLEX) CAPS Take 2 capsules by mouth daily.    Yes Historical Provider, MD  potassium chloride (K-DUR) 10 MEQ tablet Take 20 mEq by mouth daily.    Yes Historical Provider, MD  warfarin (COUMADIN) 5 MG tablet Take 5 mg by mouth at bedtime. Take 14m on Sun, Tues, and Thurs Take 2.534mon Mon, Wed, Fri, & Sat   Yes Historical Provider, MD  HYDROcodone-acetaminophen (NORCO/VICODIN) 5-325 MG per tablet Take 1 tablet by mouth every 6 (six) hours as needed for moderate pain. 05/01/14   WiOsvaldo ShipperMD   BP 118/68  Pulse 88  Temp(Src) 98.4 F (36.9 C) (Oral)  Resp 15  Ht 5' 10"  (1.778 m)  Wt 170 lb (77.111 kg)  BMI 24.39 kg/m2  SpO2 95% Physical Exam  Nursing note and vitals reviewed. Constitutional: He is oriented to person, place, and time. He appears well-developed and well-nourished. No distress.  HENT:  Head: Normocephalic and atraumatic.  Mouth/Throat: No oropharyngeal exudate.  Eyes: EOM are normal. Pupils are equal, round, and reactive to light.  Neck: Normal range of motion. Neck supple.  Cardiovascular: Normal rate and regular rhythm.  Exam reveals no friction rub.   No murmur heard. Pulmonary/Chest: Effort normal and breath sounds normal. No respiratory distress. He has no wheezes. He has no rales.  Abdominal: He exhibits no distension. There is no tenderness. There is no rebound.  Musculoskeletal: He  exhibits edema (1+bilateral).       Right knee: He exhibits decreased range of motion, swelling and effusion (moderate). He exhibits no laceration. Tenderness (upper, lateral, medial knee) found.  Neurological: He is alert and oriented to person, place, and time.  Skin: He is not diaphoretic.    ED Course  ARTHOCENTESIS Date/Time: 05/09/2014 3:52 PM Performed by: WAOsvaldo Shipperuthorized by: WAOsvaldo Shipperonsent: Verbal consent obtained. Time out: Immediately prior to procedure a "time out" was called to verify the correct patient, procedure, equipment, support staff and site/side marked as required. Indications: joint swelling  Body area: knee Local anesthesia used: yes Anesthesia: local infiltration Local anesthetic: lidocaine 1%  with epinephrine Anesthetic total: 4 ml Preparation: Patient was prepped and draped in the usual sterile fashion. Needle gauge: 18 G Ultrasound guidance: no Approach: superior Aspirate: blood-tinged and serous Aspirate amount: 8 ml Patient tolerance: Patient tolerated the procedure well with no immediate complications.   (including critical care time) Labs Review Labs Reviewed  CBC - Abnormal; Notable for the following:    Hemoglobin 12.9 (*)    All other components within normal limits  SEDIMENTATION RATE - Abnormal; Notable for the following:    Sed Rate 30 (*)    All other components within normal limits  BASIC METABOLIC PANEL - Abnormal; Notable for the following:    BUN 24 (*)    Creatinine, Ser 1.41 (*)    GFR calc non Af Amer 44 (*)    GFR calc Af Amer 52 (*)    All other components within normal limits  SYNOVIAL CELL COUNT + DIFF, W/ CRYSTALS - Abnormal; Notable for the following:    Color, Synovial STRAW (*)    Appearance-Synovial TURBID (*)    WBC, Synovial 26550 (*)    Neutrophil, Synovial 93 (*)    Monocyte-Macrophage-Synovial Fluid 7 (*)    All other components within normal limits  BODY FLUID CULTURE  GRAM  STAIN    Imaging Review Dg Knee 2 Views Right  05/01/2014   CLINICAL DATA:  Right knee pain and swelling for 1 day. No specific injury.  EXAM: RIGHT KNEE - 1-2 VIEW  COMPARISON:  None.  FINDINGS: No fracture. There is narrowing of the patellofemoral joint space compartment with minor medial joint space compartment narrowing. There are minimal marginal osteophytes most evident from the superior patella. No other arthropathic change.  Moderate joint effusion is present.  There are vascular calcifications posteriorly. Soft tissues are otherwise unremarkable.  IMPRESSION: 1. No fracture 2. Moderate joint effusion. 3. Arthropathic/ degenerative changes most evident involving the patellofemoral joint space compartment.   Electronically Signed   By: Lajean Manes M.D.   On: 05/01/2014 16:47     EKG Interpretation None      MDM   Final diagnoses:  Knee effusion, right    29M presents with R knee pain and swelling. Began today, de novo. No trauma. Pain worse with bearing weight. No fevers, but some mild chills. No vomiting/diarrhea. Has had bilateral LE edema recently with lasix improving the fluid retention. No recent antibiotics or cellulitis for the leg swelling.  He is on Coumadin for Afib. Mild/mod R knee effusion with warm to touch. Limited ROM - about 10-20 degrees. Will do R knee arthrocentesis for fluid analysis. Synovial fluid shows WBC count of 27K. I spoke with Dr. Marcelino Scot, who stated with his leg swelling, likely reactive arthritis. No antibiotics at this time, will f/u with him in the afternoon tmw. No WBC elevation, very mild ESR (30), unlikely to be septic arthritis. Of note, I gave patient wrong Orthopedic Surgeon's number. I accidentally put Dr. Percell Miller for f/u on his information, I asked our Flow Manager to call him in the morning of 6/10 to give the correct Orthopedic Surgeon for f/u.  Osvaldo Shipper, MD 05/02/14 1308  Osvaldo Shipper, MD 05/09/14 [phone]

## 2014-05-02 ENCOUNTER — Telehealth (HOSPITAL_BASED_OUTPATIENT_CLINIC_OR_DEPARTMENT_OTHER): Payer: Self-pay

## 2014-05-02 ENCOUNTER — Other Ambulatory Visit: Payer: Self-pay | Admitting: Orthopedic Surgery

## 2014-05-02 DIAGNOSIS — M79604 Pain in right leg: Secondary | ICD-10-CM

## 2014-05-02 DIAGNOSIS — R609 Edema, unspecified: Secondary | ICD-10-CM

## 2014-05-02 NOTE — Telephone Encounter (Signed)
Called pt and gave him contact info for on call provider last night  Dr Jerilynn Mages. Handy.  Contact info given to pts daughter.

## 2014-05-03 ENCOUNTER — Ambulatory Visit
Admission: RE | Admit: 2014-05-03 | Discharge: 2014-05-03 | Disposition: A | Discharge status: Home / Self Care | Payer: Medicare Other | Source: Ambulatory Visit | Attending: Orthopedic Surgery | Admitting: Orthopedic Surgery

## 2014-05-03 DIAGNOSIS — M79604 Pain in right leg: Secondary | ICD-10-CM

## 2014-05-03 DIAGNOSIS — R609 Edema, unspecified: Secondary | ICD-10-CM

## 2014-05-05 LAB — BODY FLUID CULTURE
Culture: NO GROWTH
SPECIAL REQUESTS: POSITIVE

## 2014-05-22 ENCOUNTER — Ambulatory Visit (INDEPENDENT_AMBULATORY_CARE_PROVIDER_SITE_OTHER): Payer: Medicare Other | Admitting: *Deleted

## 2014-05-22 DIAGNOSIS — I4891 Unspecified atrial fibrillation: Secondary | ICD-10-CM

## 2014-05-22 DIAGNOSIS — Z7901 Long term (current) use of anticoagulants: Secondary | ICD-10-CM

## 2014-05-22 DIAGNOSIS — Z5181 Encounter for therapeutic drug level monitoring: Secondary | ICD-10-CM

## 2014-05-22 LAB — POCT INR: INR: 2.6

## 2014-06-19 ENCOUNTER — Ambulatory Visit (INDEPENDENT_AMBULATORY_CARE_PROVIDER_SITE_OTHER): Payer: Medicare Other | Admitting: Surgery

## 2014-06-19 ENCOUNTER — Other Ambulatory Visit: Payer: Self-pay | Admitting: Dermatology

## 2014-06-19 DIAGNOSIS — Z7901 Long term (current) use of anticoagulants: Secondary | ICD-10-CM

## 2014-06-19 DIAGNOSIS — I4891 Unspecified atrial fibrillation: Secondary | ICD-10-CM

## 2014-06-19 DIAGNOSIS — Z5181 Encounter for therapeutic drug level monitoring: Secondary | ICD-10-CM

## 2014-06-19 LAB — POCT INR: INR: 4

## 2014-06-22 ENCOUNTER — Other Ambulatory Visit: Payer: Self-pay | Admitting: Cardiology

## 2014-07-03 ENCOUNTER — Ambulatory Visit (INDEPENDENT_AMBULATORY_CARE_PROVIDER_SITE_OTHER): Payer: Medicare Other | Admitting: *Deleted

## 2014-07-03 DIAGNOSIS — I4891 Unspecified atrial fibrillation: Secondary | ICD-10-CM

## 2014-07-03 DIAGNOSIS — Z5181 Encounter for therapeutic drug level monitoring: Secondary | ICD-10-CM

## 2014-07-03 DIAGNOSIS — Z7901 Long term (current) use of anticoagulants: Secondary | ICD-10-CM

## 2014-07-03 LAB — POCT INR: INR: 2.2

## 2014-07-23 ENCOUNTER — Other Ambulatory Visit: Payer: Self-pay | Admitting: Cardiology

## 2014-07-24 ENCOUNTER — Ambulatory Visit (INDEPENDENT_AMBULATORY_CARE_PROVIDER_SITE_OTHER): Payer: Medicare Other | Admitting: *Deleted

## 2014-07-24 ENCOUNTER — Other Ambulatory Visit: Payer: Self-pay | Admitting: Physician Assistant

## 2014-07-24 ENCOUNTER — Other Ambulatory Visit: Payer: Self-pay | Admitting: *Deleted

## 2014-07-24 DIAGNOSIS — Z7901 Long term (current) use of anticoagulants: Secondary | ICD-10-CM

## 2014-07-24 DIAGNOSIS — Z5181 Encounter for therapeutic drug level monitoring: Secondary | ICD-10-CM

## 2014-07-24 DIAGNOSIS — I4891 Unspecified atrial fibrillation: Secondary | ICD-10-CM

## 2014-07-24 LAB — POCT INR: INR: 1.9

## 2014-08-14 ENCOUNTER — Ambulatory Visit (INDEPENDENT_AMBULATORY_CARE_PROVIDER_SITE_OTHER): Payer: Medicare Other | Admitting: *Deleted

## 2014-08-14 DIAGNOSIS — I4891 Unspecified atrial fibrillation: Secondary | ICD-10-CM

## 2014-08-14 DIAGNOSIS — Z7901 Long term (current) use of anticoagulants: Secondary | ICD-10-CM

## 2014-08-14 DIAGNOSIS — Z5181 Encounter for therapeutic drug level monitoring: Secondary | ICD-10-CM

## 2014-08-14 LAB — POCT INR: INR: 2

## 2014-08-21 ENCOUNTER — Other Ambulatory Visit: Payer: Self-pay | Admitting: Cardiology

## 2014-09-04 ENCOUNTER — Other Ambulatory Visit: Payer: Self-pay | Admitting: Dermatology

## 2014-09-11 ENCOUNTER — Ambulatory Visit (INDEPENDENT_AMBULATORY_CARE_PROVIDER_SITE_OTHER): Payer: Medicare Other | Admitting: Pharmacist Clinician (PhC)/ Clinical Pharmacy Specialist

## 2014-09-11 DIAGNOSIS — Z7901 Long term (current) use of anticoagulants: Secondary | ICD-10-CM

## 2014-09-11 DIAGNOSIS — Z5181 Encounter for therapeutic drug level monitoring: Secondary | ICD-10-CM

## 2014-09-11 DIAGNOSIS — I4891 Unspecified atrial fibrillation: Secondary | ICD-10-CM

## 2014-09-11 LAB — POCT INR: INR: 2.4

## 2014-09-18 ENCOUNTER — Other Ambulatory Visit: Payer: Self-pay | Admitting: *Deleted

## 2014-09-18 MED ORDER — WARFARIN SODIUM 5 MG PO TABS: ORAL_TABLET | ORAL | Status: DC

## 2014-10-02 ENCOUNTER — Ambulatory Visit (INDEPENDENT_AMBULATORY_CARE_PROVIDER_SITE_OTHER): Payer: Medicare Other | Admitting: Cardiology

## 2014-10-02 ENCOUNTER — Encounter (INDEPENDENT_AMBULATORY_CARE_PROVIDER_SITE_OTHER): Payer: Medicare Other

## 2014-10-02 ENCOUNTER — Encounter: Payer: Self-pay | Admitting: Cardiology

## 2014-10-02 VITALS — BP 132/78 | HR 80 | Ht 70.0 in | Wt 175.0 lb

## 2014-10-02 DIAGNOSIS — Z9889 Other specified postprocedural states: Secondary | ICD-10-CM

## 2014-10-02 DIAGNOSIS — Z7901 Long term (current) use of anticoagulants: Secondary | ICD-10-CM

## 2014-10-02 DIAGNOSIS — I4891 Unspecified atrial fibrillation: Secondary | ICD-10-CM

## 2014-10-02 DIAGNOSIS — I340 Nonrheumatic mitral (valve) insufficiency: Secondary | ICD-10-CM

## 2014-10-02 DIAGNOSIS — I5032 Chronic diastolic (congestive) heart failure: Secondary | ICD-10-CM

## 2014-10-02 DIAGNOSIS — G4733 Obstructive sleep apnea (adult) (pediatric): Secondary | ICD-10-CM

## 2014-10-02 DIAGNOSIS — Z9989 Dependence on other enabling machines and devices: Secondary | ICD-10-CM

## 2014-10-02 DIAGNOSIS — I480 Paroxysmal atrial fibrillation: Secondary | ICD-10-CM

## 2014-10-02 LAB — BASIC METABOLIC PANEL
BUN: 20 mg/dL (ref 6–23)
CHLORIDE: 102 meq/L (ref 96–112)
CO2: 30 mEq/L (ref 19–32)
Calcium: 9 mg/dL (ref 8.4–10.5)
Creatinine, Ser: 1.5 mg/dL (ref 0.4–1.5)
GFR: 47.78 mL/min — AB (ref >60.00)
Glucose, Bld: 76 mg/dL (ref 70–99)
Potassium: 4.7 mEq/L (ref 3.5–5.1)
SODIUM: 138 meq/L (ref 135–145)

## 2014-10-02 MED ORDER — POTASSIUM CHLORIDE CRYS ER 20 MEQ PO TBCR: EXTENDED_RELEASE_TABLET | ORAL | Status: DC

## 2014-10-02 MED ORDER — FUROSEMIDE 20 MG PO TABS: 20.0000 mg | ORAL_TABLET | Freq: Every day | ORAL | Status: DC

## 2014-10-02 NOTE — Patient Instructions (Signed)
Your physician recommends that you continue on your current medications as directed. Please refer to the Current Medication list given to you today.  Your physician recommends that you have lab work TODAY (BMET).  Your physician wants you to follow-up in: 6 months with Dr. Turner. You will receive a reminder letter in the mail two months in advance. If you don't receive a letter, please call our office to schedule the follow-up appointment.  

## 2014-10-02 NOTE — Progress Notes (Signed)
[place], [place], [place]  [address] Phone: [phone] Fax:  [phone]  Date:  10/02/2014   ID:  [name], DOB 1929/03/22, MRN [id number]  PCP:  Irven Shelling, MD  Cardiologist:  Fransico Him, MD    History of Present Illness: [name] is a 80 y.o. male with a history of MV repair for MVP/MR and MAZE procedure, atrial fibrillation, OSA on CPAP and chronic LE edema with chronic diastolic CHF who presents today for followup. He is doing well. He denies any chest pain, SOB, DOE, palpitations or syncope.He has chronic LLE edema.  He is tolerating his CPAP well.  He uses a nasal pillow mask which he tolerates and feels the pressure is fine.  Lately he has been waking up at night and so he does not  feel rested in the am.     Wt Readings from Last 3 Encounters:  10/02/14 175 lb (79.379 kg)  05/01/14 170 lb (77.111 kg)  03/19/14 166 lb (75.297 kg)     Past Medical History  Diagnosis Date  . Heart palpitations     PT STATES NOT REALLY A BIG PROBLEM--DENIES CHEST PAINS  . Insomnia   . BPH (benign prostatic hypertrophy)   . Constipation   . History of torn meniscus of left knee     PAINFUL LEFT KNEE  . Hearing loss     BILATERAL - WEARS HEARING AIDS  . Mitral valve prolapse   . Mitral valve regurgitation   . OSA on CPAP   . Erectile dysfunction   . CHF (congestive heart failure)   . Dysrhythmia     a fib  . Cancer     SKIN CANCERS  . Arthritis     HANDS  . Atrial fibrillation 02/10/2013  . 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 AAA (abdominal aortic aneurysm) without rupture 03/03/2013    Small, partially thrombosed saccular aneurysm  . S/P Maze operation for atrial fibrillation 03/15/2013    Complete bilateral lesion set using cryothermy and bipolar radiofrequency ablation with oversewing of LA appendage via right mini thoracotomy  . Chronic diastolic congestive heart failure 03/20/2013  . Hyperlipidemia     statin intolerance   . Hyperlipidemia     Current Outpatient Prescriptions  Medication Sig Dispense Refill  . cetirizine (ZYRTEC) 10 MG tablet Take 10 mg by mouth as needed for allergies.    . cholecalciferol (VITAMIN D) 1000 UNITS tablet Take 1,000 Units by mouth every morning.     . colestipol (COLESTID) 1 G tablet Take 1 g by mouth 2 (two) times daily.    . furosemide (LASIX) 20 MG tablet Take 1 tablet (20 mg total) by mouth daily. 30 tablet 11  . Glucosamine-Chondroit-Vit C-Mn (GLUCOSAMINE CHONDR 500 COMPLEX) CAPS Take 2 capsules by mouth daily.     Marland Kitchen HYDROcodone-acetaminophen (NORCO/VICODIN) 5-325 MG per tablet Take 1 tablet by mouth every 6 (six) hours as needed for moderate pain. 30 tablet 0  . potassium chloride (K-DUR) 10 MEQ tablet Take 20 mEq by mouth daily.     . potassium chloride SA (K-DUR,KLOR-CON) 20 MEQ tablet TAKE 1 TABLET BY MOUTH ONCE DAILY. **NEED TO SCHEDULE AN APPOINTMENT FOR FURTHER REFILLS. 30 tablet 0  . warfarin (COUMADIN) 5 MG tablet Take as directed by Coumadin clinic 30 tablet 3  . colesevelam (WELCHOL) 625 MG tablet Take 625 mg by mouth at bedtime.      No current facility-administered medications for  this visit.    Allergies:    Allergies  Allergen Reactions  . Statins     Muscle pains  . Latex Rash    bandaids--no other latex problems    Social History:  The patient  reports that he has quit smoking. He started smoking about 53 years ago. He does not have any smokeless tobacco history on file. He reports that he drinks about 3.5 oz of alcohol per week. He reports that he does not use illicit drugs.   Family History:  The patient's family history includes Alzheimer's disease in his mother; Arthritis in his mother; Congestive Heart Failure in his mother; Coronary artery disease in his father; Diabetes in his mother; Heart attack in his father; Heart disease in his father.   ROS:  Please see the history of present illness.      All other systems reviewed and negative.    PHYSICAL EXAM: VS:  BP 132/78 mmHg  Pulse 80  Ht 5\' 10"  (1.778 m)  Wt 175 lb (79.379 kg)  BMI 25.11 kg/m2 Well nourished, well developed, in no acute distress HEENT: normal Neck: no JVD Cardiac:  normal S1, S2; RRR; no murmur Lungs:  clear to auscultation bilaterally, no wheezing, rhonchi or rales Abd: soft, nontender, no hepatomegaly Ext: trace edema Skin: warm and dry Neuro:  CNs 2-12 intact, no focal abnormalities noted  EKG:    NSR with PAC's and rSR' in V1   ASSESSMENT AND PLAN:  1. MVP with severe MR s/p MV repair 2. Afib s/p MAZE - maintaining NSR - continue Warfarin 3. Chronic LE edema with chronic diastolic CHF - continue Lasix/potassium - check BMET   4.  OSA on CPAP - tolerating well.  I will get a d/l from his DME   Followup with me in 6 months   Signed, Fransico Him, MD [facility] 10/02/2014 9:00 AM  u

## 2014-10-04 ENCOUNTER — Ambulatory Visit (INDEPENDENT_AMBULATORY_CARE_PROVIDER_SITE_OTHER): Payer: Medicare Other | Admitting: Pharmacist

## 2014-10-04 DIAGNOSIS — Z5181 Encounter for therapeutic drug level monitoring: Secondary | ICD-10-CM

## 2014-10-04 LAB — POCT INR: INR: 2.2

## 2014-10-27 ENCOUNTER — Other Ambulatory Visit: Payer: Self-pay | Admitting: Cardiology

## 2014-10-27 NOTE — Telephone Encounter (Signed)
Rx was sent to pharmacy electronically. 

## 2014-11-01 ENCOUNTER — Ambulatory Visit (INDEPENDENT_AMBULATORY_CARE_PROVIDER_SITE_OTHER): Payer: Medicare Other

## 2014-11-01 DIAGNOSIS — Z5181 Encounter for therapeutic drug level monitoring: Secondary | ICD-10-CM

## 2014-11-01 DIAGNOSIS — I4891 Unspecified atrial fibrillation: Secondary | ICD-10-CM

## 2014-11-01 DIAGNOSIS — Z7901 Long term (current) use of anticoagulants: Secondary | ICD-10-CM

## 2014-11-01 LAB — POCT INR: INR: 2.9

## 2014-11-26 ENCOUNTER — Telehealth: Payer: Self-pay | Admitting: Cardiology

## 2014-11-26 NOTE — Telephone Encounter (Signed)
Spoke with patient and his daughter, [name]. Pt is HOH, spoke on speaker to both. Advised that extraction will likely be postponed due to pt taking coumadin. Updated that dental office is faxing request for holding coumadin prior to the tooth extraction, And that he will likely need to come to the coumadin clinic to follow.  Advised that once we receive the fax and have more information we will contact him or his daughter [name].  Her number is [phone].

## 2014-11-26 NOTE — Telephone Encounter (Signed)
F/u  Pt daughter wanted to speak with Rn. Please call back and discuss.

## 2014-11-26 NOTE — Telephone Encounter (Signed)
Spoke with Vicky at dental office. Requested they fax documentation of predication antibiotics, type of procedure, and request to stop/adjust Coumadin.

## 2014-11-26 NOTE — Telephone Encounter (Signed)
Updated with Vicky at Dentist office that information has been forwarded to Dr. Radford Pax for review.   Contacted pt to updated on status, left message on machine to call back.

## 2014-11-26 NOTE — Telephone Encounter (Signed)
New message    1. What dental office are you calling from? Yes   2. What is your office phone and fax number? fax [phone]   3. What type of procedure is the patient having performed? Tooth extraction   4. What date is procedure scheduled? 11-28-2014 @ 3:00 pm   5. What is your question (ex. Antibiotics prior to procedure, holding medication-we need to know how long dentist wants pt to hold med)? How many days should coumadin be held.

## 2014-11-26 NOTE — Telephone Encounter (Signed)
Discussed with [facility] and Couadin clinic and was directed to contact dentist as they set the plan.    Spoke with Vicky from dentist office regarding if pt needing to stop Coumadin in order to complete scheduled tooth extraction. She stated that ordering MD has to stop or make adjustments to medication before they can proceed with procedure.   Routing to Dr. Radford Pax to advise on stopping Coumadin

## 2014-11-26 NOTE — Telephone Encounter (Signed)
Follow up      Returning [name]'s call

## 2014-11-26 NOTE — Telephone Encounter (Signed)
Patient does need SBE prophylaxis which dentist should order.  Please forward to COumadin clinic for guidance on holding Coumadin

## 2014-11-27 NOTE — Telephone Encounter (Signed)
To [name] for review.

## 2014-11-28 NOTE — Telephone Encounter (Signed)
Reiterated instruction to hold coumadin for three days prior to procedure.  Informed Tammy that the dentist should tell the patient when to resume the coumadin after the tooth extraction.

## 2014-11-28 NOTE — Telephone Encounter (Signed)
Pt has a CHADS score of 2- per Bridging protocol- okay to hold Coumadin with no Lovenox bridge.  For dental extraction, 3 days should be a sufficient amount of time to have INR below 2.

## 2014-11-28 NOTE — Telephone Encounter (Signed)
Left message on Tammy's private VM that coumadin will need to be held for 3 days prior to dental procedure.  Faxed to [facility] at [phone]. Also left message on office VM that clearance instruction has been sent.

## 2014-11-28 NOTE — Telephone Encounter (Signed)
Follow Up   Daughter calling back regarding previous mess. about planned dental extraction. Please call.

## 2014-12-13 ENCOUNTER — Ambulatory Visit (INDEPENDENT_AMBULATORY_CARE_PROVIDER_SITE_OTHER): Payer: Medicare Other | Admitting: *Deleted

## 2014-12-13 DIAGNOSIS — Z7901 Long term (current) use of anticoagulants: Secondary | ICD-10-CM

## 2014-12-13 DIAGNOSIS — Z5181 Encounter for therapeutic drug level monitoring: Secondary | ICD-10-CM

## 2014-12-13 DIAGNOSIS — I4891 Unspecified atrial fibrillation: Secondary | ICD-10-CM

## 2014-12-13 LAB — POCT INR: INR: 2.1

## 2014-12-14 IMAGING — CT CT ANGIO CHEST
1 of 5 series · 12 of 36 positions shown · IV contrast (80CC OMNI 350)
Comparison: Chest CT 03/29/2006.

CLINICAL DATA: Preoperative evaluation for mitral regurgitation.

CT ANGIOGRAPHY CHEST
TECHNIQUE: Multidetector CT imaging of the chest using the
standard protocol during bolus administration of intravenous
contrast. Multiplanar reconstructed images including MIPs were
obtained and reviewed to evaluate the vascular anatomy.
Contrast: 80mL OMNIPAQUE IOHEXOL 300 MG/ML  SOLN

[Series 3: angio · axial · 0.66mm/px · z∈[-354,-56]mm · 12 of 141 slices shown]
[im 11/141  lung]
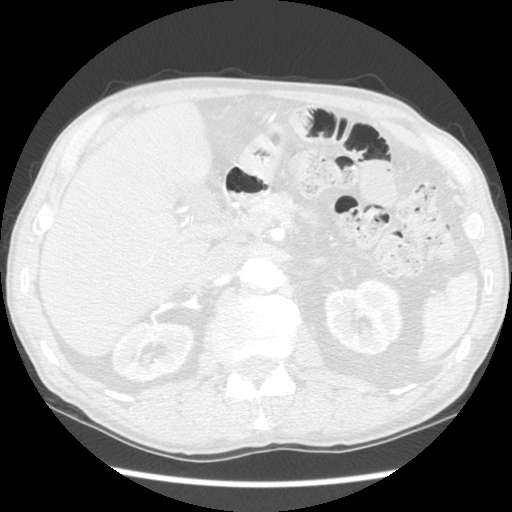
[im 22/141  mediastinal]
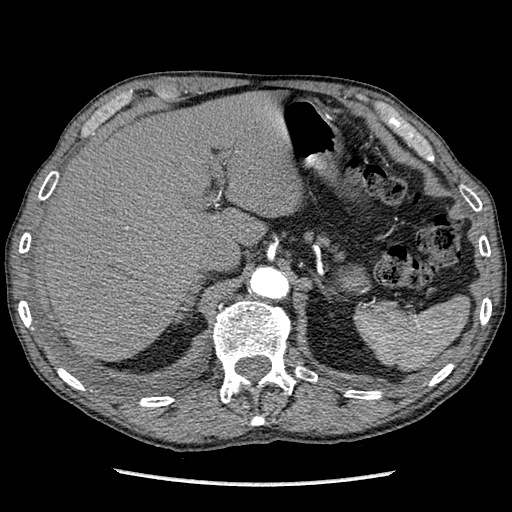
[im 33/141  lung]
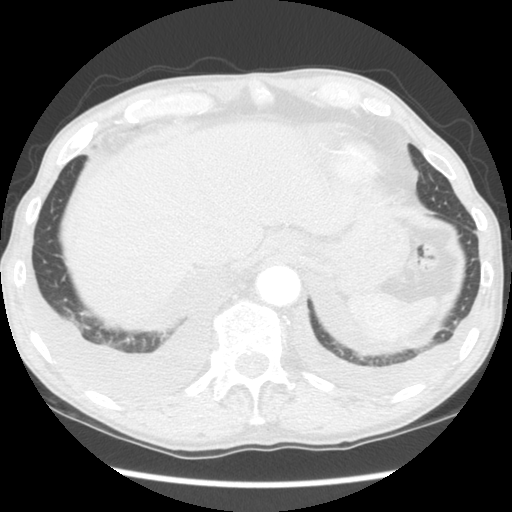
[im 44/141  mediastinal]
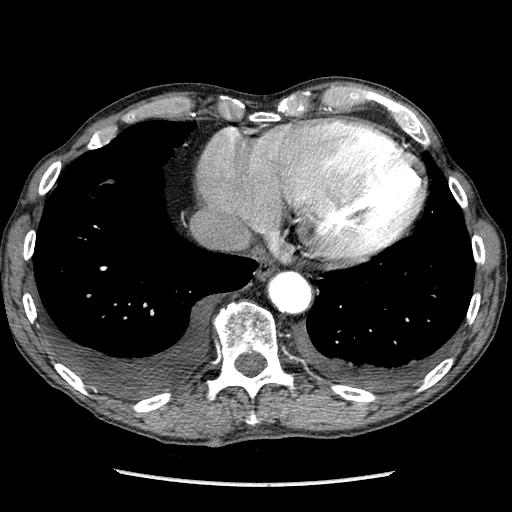
[im 54/141  lung]
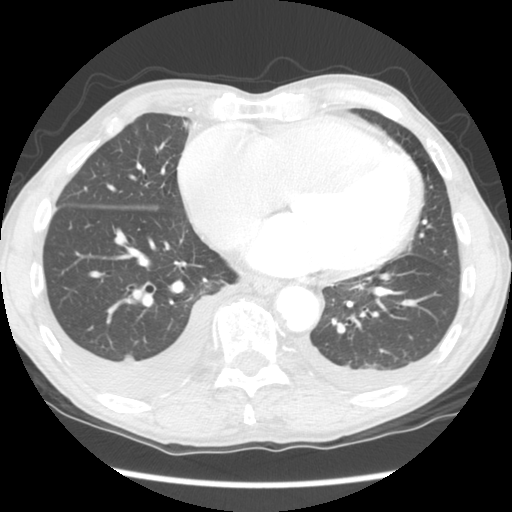
[im 65/141  mediastinal]
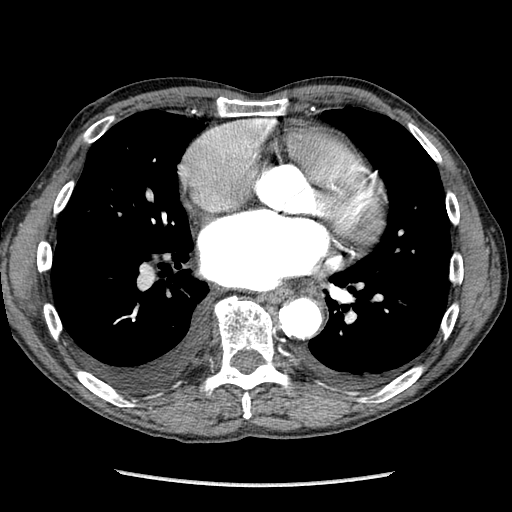
[im 76/141  lung]
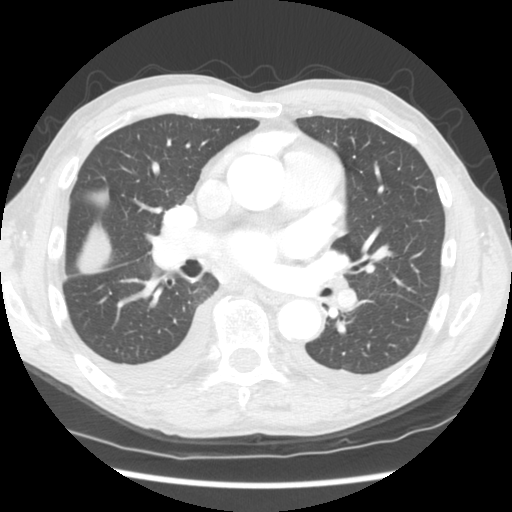
[im 87/141  mediastinal]
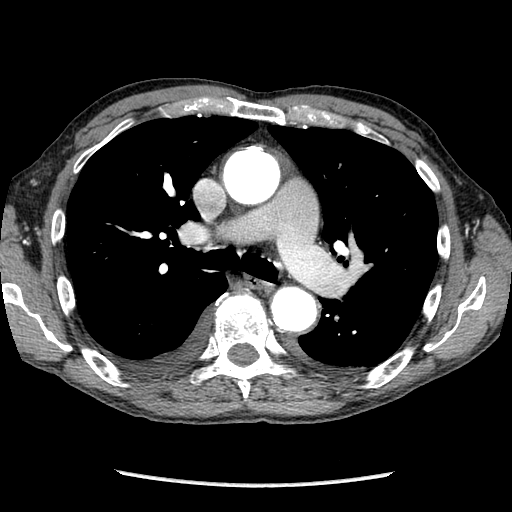
[im 97/141  lung]
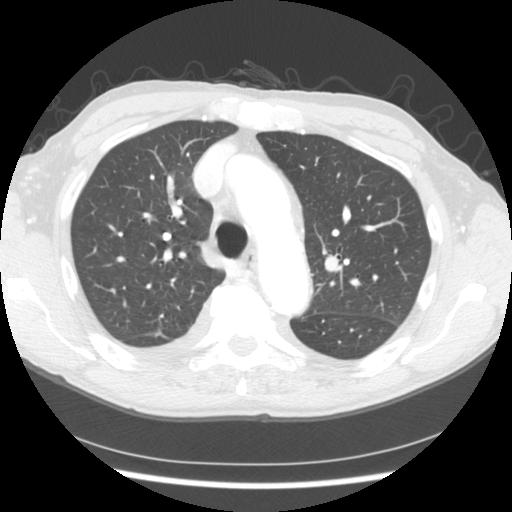
[im 108/141  mediastinal]
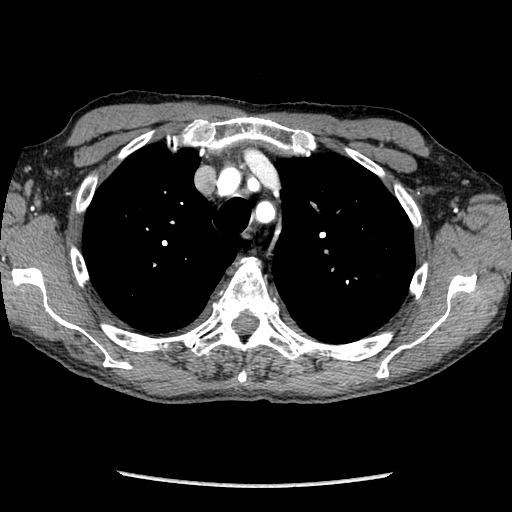
[im 119/141  lung]
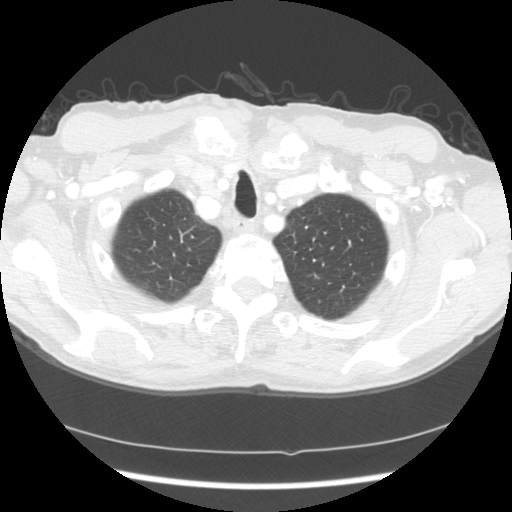
[im 130/141  mediastinal]
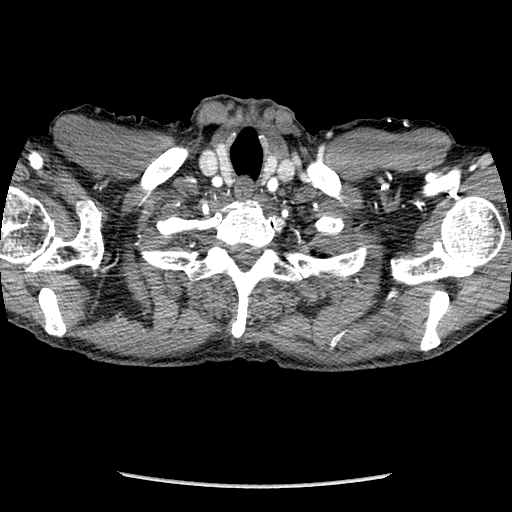

[12 of 36 positions shown; findings below may reference images not displayed]

FINDINGS: Mediastinum: Heart size is mildly enlarged with biatrial dilatation
and right ventricular dilatation.  There is no significant
pericardial fluid, thickening or pericardial calcification. There
is atherosclerosis of the thoracic aorta, the great vessels of the
mediastinum and the coronary arteries, including calcified
atherosclerotic plaque in the left main, left anterior descending
and right coronary arteries. Numerous reactive size mediastinal and
hilar lymph nodes.  No definite pathologic nodal enlargement.
Esophagus is unremarkable in appearance.

Lungs/Pleura: Moderate right and small left-sided pleural effusions
are simple in appearance and layer posteriorly.  This is associated
with minimal passive atelectasis in the lower lobes of the lungs
bilaterally.  Some fluid is also seen tracking in the minor
fissure.  No acute consolidative airspace disease.  No suspicious
appearing pulmonary nodules or masses.

Upper Abdomen: Multifocal parenchymal thinning in the kidneys
bilaterally.  Atherosclerosis.

Musculoskeletal: There are no aggressive appearing lytic or blastic
lesions noted in the visualized portions of the skeleton.
Multilevel degenerative disc disease, most pronounced at T5-T6
where there is a large disc osteophyte complex posteriorly which
significantly impinges upon the central spinal canal resulting in
an AP diameter of only 7 mm.
IMPRESSION: 1.  Cardiomegaly with right ventricular and biatrial dilatation.
2.  Moderate right and small left-sided pleural effusions are
simple in appearance of layering posteriorly.
3. Atherosclerosis, including left main and two-vessel coronary
artery disease. assessment for potential risk factor modification,
dietary therapy or pharmacologic therapy may be warranted, if
clinically indicated.
4.  Large disc/osteophyte complex extending posterior to T5-T6
resulting in narrowing of the central spinal canal to only 7 mm.
Clinical correlation for signs and symptoms of spinal cord
compression is recommended.  This could be better evaluated with a
non-emergent MRI of the thoracic spine if clinically indicated.

## 2015-01-07 ENCOUNTER — Encounter: Payer: Self-pay | Admitting: Cardiology

## 2015-01-24 ENCOUNTER — Ambulatory Visit (INDEPENDENT_AMBULATORY_CARE_PROVIDER_SITE_OTHER): Payer: Medicare Other

## 2015-01-24 DIAGNOSIS — Z7901 Long term (current) use of anticoagulants: Secondary | ICD-10-CM

## 2015-01-24 DIAGNOSIS — I4891 Unspecified atrial fibrillation: Secondary | ICD-10-CM

## 2015-01-24 DIAGNOSIS — Z5181 Encounter for therapeutic drug level monitoring: Secondary | ICD-10-CM

## 2015-01-24 LAB — POCT INR: INR: 3

## 2015-01-30 ENCOUNTER — Other Ambulatory Visit: Payer: Self-pay | Admitting: Dermatology

## 2015-03-05 ENCOUNTER — Other Ambulatory Visit: Payer: Self-pay | Admitting: Cardiology

## 2015-03-07 ENCOUNTER — Ambulatory Visit (INDEPENDENT_AMBULATORY_CARE_PROVIDER_SITE_OTHER): Payer: Medicare Other | Admitting: *Deleted

## 2015-03-07 DIAGNOSIS — Z5181 Encounter for therapeutic drug level monitoring: Secondary | ICD-10-CM | POA: Diagnosis not present

## 2015-03-07 DIAGNOSIS — I4891 Unspecified atrial fibrillation: Secondary | ICD-10-CM

## 2015-03-07 DIAGNOSIS — Z7901 Long term (current) use of anticoagulants: Secondary | ICD-10-CM | POA: Diagnosis not present

## 2015-03-07 LAB — POCT INR: INR: 2.7

## 2015-03-25 ENCOUNTER — Ambulatory Visit: Payer: Medicare Other | Admitting: Thoracic Surgery (Cardiothoracic Vascular Surgery)

## 2015-03-28 ENCOUNTER — Encounter: Payer: Self-pay | Admitting: Thoracic Surgery (Cardiothoracic Vascular Surgery)

## 2015-03-28 ENCOUNTER — Ambulatory Visit (INDEPENDENT_AMBULATORY_CARE_PROVIDER_SITE_OTHER): Payer: Medicare Other | Admitting: Thoracic Surgery (Cardiothoracic Vascular Surgery)

## 2015-03-28 VITALS — BP 139/77 | HR 83 | Resp 16 | Ht 70.0 in | Wt 170.0 lb

## 2015-03-28 DIAGNOSIS — I340 Nonrheumatic mitral (valve) insufficiency: Secondary | ICD-10-CM

## 2015-03-28 DIAGNOSIS — Z9889 Other specified postprocedural states: Secondary | ICD-10-CM

## 2015-03-28 DIAGNOSIS — I480 Paroxysmal atrial fibrillation: Secondary | ICD-10-CM

## 2015-03-28 DIAGNOSIS — Z8679 Personal history of other diseases of the circulatory system: Secondary | ICD-10-CM

## 2015-03-28 NOTE — Progress Notes (Signed)
      [address]       [name] [address]             [phone]     CARDIOTHORACIC SURGERY OFFICE NOTE  Referring Provider is Austin Margarita, MD PCP is Austin Shelling, MD   HPI:  Patient returns for routine followup and surveillance status post minimally invasive mitral valve repair and Maze procedure 03/15/2013. He was last seen here in our office on 03/19/2014.  Since then he has continued to do very well. He has been followed regular by Dr. Radford Pax. He remains chronically anticoagulated using Coumadin. He has not had any bleeding complications or other problems. He has maintained sinus rhythm. He denies any symptoms of exertional shortness of breath or chest discomfort. His physical activity is limited primarily by degenerative arthritis in his knees. Overall he feels quite well and reports no complaints.   Current Outpatient Prescriptions  Medication Sig Dispense Refill  . cetirizine (ZYRTEC) 10 MG tablet Take 10 mg by mouth as needed for allergies.    . cholecalciferol (VITAMIN D) 1000 UNITS tablet Take 1,000 Units by mouth every morning.     . colestipol (COLESTID) 1 G tablet Take 1 g by mouth 2 (two) times daily.    . furosemide (LASIX) 20 MG tablet Take 1 tablet (20 mg total) by mouth daily. 30 tablet 11  . Glucosamine-Chondroit-Vit C-Mn (GLUCOSAMINE CHONDR 500 COMPLEX) CAPS Take 2 capsules by mouth daily.     Marland Kitchen HYDROcodone-acetaminophen (NORCO/VICODIN) 5-325 MG per tablet Take 1 tablet by mouth every 6 (six) hours as needed for moderate pain. 30 tablet 0  . potassium chloride SA (K-DUR,KLOR-CON) 20 MEQ tablet Take 1 tablet (20 mEq total) by mouth daily. 30 tablet 10  . warfarin (COUMADIN) 5 MG tablet TAKE AS DIRECTED BY COUMADIN CLINIC 30 tablet 3   No current facility-administered medications for this visit.      Physical Exam:   BP 139/77 mmHg  Pulse 83  Resp 16  Ht 5\' 10"  (1.778 m)  Wt 170 lb (77.111 kg)  BMI 24.39 kg/m2  SpO2  98%  General:  Well-appearing  Chest:   clear  CV:   Regular rate and rhythm without murmur  Incisions:  n/a  Abdomen:  Soft and nontender  Extremities:  Warm and well-perfused  Diagnostic Tests:  2 channel telemetry rhythm strips demonstrates normal sinus rhythm   Impression:  Patient is doing very well approximately 2 years status post minimally invasive mitral valve repair and Maze procedure. He reports stable symptoms of exertional shortness of breath only with very strenuous physical activity. He has not had any cardiac related problems whatsoever. He has been maintaining sinus rhythm. He remains chronically anticoagulated using warfarin.  Plan:  The patient will call and return to see [place] as needed in the future. He has been referred to the atrial fibrillation clinic for long-term follow-up and surveillance. He will continue to follow-up with Dr. Radford Pax and Dr. Laurann Montana. He has been reminded regarding the lifelong need for antibiotic prophylaxis for all dental cleaning and related procedures.  I spent in excess of 15 minutes during the conduct of this office consultation and >50% of this time involved direct face-to-face encounter with the patient for counseling and/or coordination of their care.   Valentina Gu. Roxy Manns, MD 03/28/2015 2:30 PM

## 2015-03-28 NOTE — Patient Instructions (Signed)

## 2015-04-09 ENCOUNTER — Encounter: Payer: Self-pay | Admitting: Cardiology

## 2015-04-09 ENCOUNTER — Ambulatory Visit (INDEPENDENT_AMBULATORY_CARE_PROVIDER_SITE_OTHER): Payer: Medicare Other | Admitting: Cardiology

## 2015-04-09 ENCOUNTER — Other Ambulatory Visit: Payer: Self-pay | Admitting: *Deleted

## 2015-04-09 ENCOUNTER — Ambulatory Visit (INDEPENDENT_AMBULATORY_CARE_PROVIDER_SITE_OTHER): Payer: Medicare Other | Admitting: *Deleted

## 2015-04-09 VITALS — BP 110/60 | HR 65 | Ht 70.0 in | Wt 173.4 lb

## 2015-04-09 DIAGNOSIS — I341 Nonrheumatic mitral (valve) prolapse: Secondary | ICD-10-CM

## 2015-04-09 DIAGNOSIS — I480 Paroxysmal atrial fibrillation: Secondary | ICD-10-CM

## 2015-04-09 DIAGNOSIS — I5032 Chronic diastolic (congestive) heart failure: Secondary | ICD-10-CM | POA: Diagnosis not present

## 2015-04-09 DIAGNOSIS — I4891 Unspecified atrial fibrillation: Secondary | ICD-10-CM | POA: Diagnosis not present

## 2015-04-09 DIAGNOSIS — Z9889 Other specified postprocedural states: Secondary | ICD-10-CM

## 2015-04-09 DIAGNOSIS — I340 Nonrheumatic mitral (valve) insufficiency: Secondary | ICD-10-CM

## 2015-04-09 DIAGNOSIS — Z7901 Long term (current) use of anticoagulants: Secondary | ICD-10-CM

## 2015-04-09 DIAGNOSIS — Z8679 Personal history of other diseases of the circulatory system: Secondary | ICD-10-CM

## 2015-04-09 DIAGNOSIS — G4733 Obstructive sleep apnea (adult) (pediatric): Secondary | ICD-10-CM

## 2015-04-09 DIAGNOSIS — Z79899 Other long term (current) drug therapy: Secondary | ICD-10-CM

## 2015-04-09 DIAGNOSIS — Z5181 Encounter for therapeutic drug level monitoring: Secondary | ICD-10-CM

## 2015-04-09 DIAGNOSIS — I5022 Chronic systolic (congestive) heart failure: Secondary | ICD-10-CM

## 2015-04-09 DIAGNOSIS — Z9989 Dependence on other enabling machines and devices: Secondary | ICD-10-CM

## 2015-04-09 LAB — BASIC METABOLIC PANEL
BUN: 22 mg/dL (ref 6–23)
CO2: 31 mEq/L (ref 19–32)
Calcium: 9.8 mg/dL (ref 8.4–10.5)
Chloride: 101 mEq/L (ref 96–112)
Creatinine, Ser: 1.62 mg/dL — ABNORMAL HIGH (ref 0.40–1.50)
GFR: 43.33 mL/min — ABNORMAL LOW (ref >60.00)
GLUCOSE: 83 mg/dL (ref 70–99)
Potassium: 5.1 mEq/L (ref 3.5–5.1)
Sodium: 137 mEq/L (ref 135–145)

## 2015-04-09 LAB — POCT INR: INR: 2.4

## 2015-04-09 MED ORDER — FUROSEMIDE 20 MG PO TABS: 20.0000 mg | ORAL_TABLET | ORAL | Status: DC

## 2015-04-09 NOTE — Patient Instructions (Signed)

## 2015-04-09 NOTE — Progress Notes (Signed)
Cardiology Office Note   Date:  04/09/2015   ID:  [name], DOB Nov 29, 1928, MRN [id number]  PCP:  Irven Shelling, MD    Chief Complaint  Patient presents with  . Follow-up    Sleep Apnea      History of Present Illness: [name] is a 81 y.o. male with a history of MV repair for MVP/MR and MAZE procedure, atrial fibrillation, OSA on CPAP and chronic LE edema with chronic diastolic CHF who presents today for followup. He is doing well. He denies any chest pain, SOB, DOE, palpitations or syncope.He has chronic LLE edema which actually has resolved. He is tolerating his CPAP well. He uses a nasal pillow mask which he tolerates and feels the pressure is fine.he feels rested in the am and has no daytime sleepiness. Past Medical History  Diagnosis Date  . Heart palpitations     PT STATES NOT REALLY A BIG PROBLEM--DENIES CHEST PAINS  . Insomnia   . BPH (benign prostatic hypertrophy)   . Constipation   . History of torn meniscus of left knee     PAINFUL LEFT KNEE  . Hearing loss     BILATERAL - WEARS HEARING AIDS  . Mitral valve prolapse   . Mitral valve regurgitation   . OSA on CPAP   . Erectile dysfunction   . CHF (congestive heart failure)   . Dysrhythmia     a fib  . Cancer     SKIN CANCERS  . Arthritis     HANDS  . Atrial fibrillation 02/10/2013  . 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 AAA (abdominal aortic aneurysm) without rupture 03/03/2013    Small, partially thrombosed saccular aneurysm  . S/P Maze operation for atrial fibrillation 03/15/2013    Complete bilateral lesion set using cryothermy and bipolar radiofrequency ablation with oversewing of LA appendage via right mini thoracotomy  . Chronic diastolic congestive heart failure 03/20/2013  . Hyperlipidemia     statin intolerance  . Hyperlipidemia     Past Surgical History  Procedure Laterality Date  . Skin cancers removed from left forehead and left nose    . Skin cancers  removed from both legs    . Surgery left neck for removal of metastatic lymph node ( from cancer left forehead)  and left neck dissection-rest of lymph nodes negative for any cancer    . Carpal tunnel release      BILATERAL  . Right rotator cuff repair    . Tee without cardioversion N/A 01/31/2013    Procedure: TRANSESOPHAGEAL ECHOCARDIOGRAM (TEE);  Surgeon: Sueanne Margarita, MD;  Location: [facility];  Service: Cardiovascular;  Laterality: N/A;  . Hemorrhoid surgery      1973  . Mitral valve repair Right 03/15/2013    Procedure: MINIMALLY INVASIVE MITRAL VALVE REPAIR (MVR);  Surgeon: Rexene Alberts, MD;  Location: [facility];  Service: Open Heart Surgery;  Laterality: Right;  . Minimally invasive maze procedure Right 03/15/2013    Procedure: MINIMALLY INVASIVE MAZE PROCEDURE;  Surgeon: Rexene Alberts, MD;  Location: [facility];  Service: Open Heart Surgery;  Laterality: Right;  (R) AXILLARY CANNULATION  . Intraoperative transesophageal echocardiogram Right 03/15/2013    Procedure: INTRAOPERATIVE TRANSESOPHAGEAL ECHOCARDIOGRAM;  Surgeon: Rexene Alberts, MD;  Location: [facility];  Service: Open Heart Surgery;  Laterality: Right;     Current Outpatient Prescriptions  Medication Sig Dispense Refill  . cetirizine (ZYRTEC) 10 MG tablet Take 10 mg  by mouth as needed for allergies.    . cholecalciferol (VITAMIN D) 1000 UNITS tablet Take 1,000 Units by mouth every morning.     . colestipol (COLESTID) 1 G tablet Take 1 g by mouth 2 (two) times daily.    . furosemide (LASIX) 20 MG tablet Take 1 tablet (20 mg total) by mouth daily. 30 tablet 11  . Glucosamine-Chondroit-Vit C-Mn (GLUCOSAMINE CHONDR 500 COMPLEX) CAPS Take 2 capsules by mouth daily.     Marland Kitchen HYDROcodone-acetaminophen (NORCO/VICODIN) 5-325 MG per tablet Take 1 tablet by mouth every 6 (six) hours as needed for moderate pain. 30 tablet 0  . potassium chloride SA (K-DUR,KLOR-CON) 20 MEQ tablet Take 1 tablet (20 mEq total) by mouth daily. 30 tablet 10  .  triamcinolone cream (KENALOG) 0.1 % Apply 1 application topically daily.    Marland Kitchen warfarin (COUMADIN) 5 MG tablet TAKE AS DIRECTED BY COUMADIN CLINIC 30 tablet 3   No current facility-administered medications for this visit.    Allergies:   Statins and Latex    Social History:  The patient  reports that he has quit smoking. He started smoking about 54 years ago. He does not have any smokeless tobacco history on file. He reports that he drinks about 3.5 oz of alcohol per week. He reports that he does not use illicit drugs.   Family History:  The patient's family history includes Alzheimer's disease in his mother; Arthritis in his mother; Congestive Heart Failure in his mother; Coronary artery disease in his father; Diabetes in his mother; Heart attack in his father; Heart disease in his father.    ROS:  Please see the history of present illness.   Otherwise, review of systems are positive for none.   All other systems are reviewed and negative.    PHYSICAL EXAM: VS:  BP 110/60 mmHg  Pulse 65  Ht 5\' 10"  (1.778 m)  Wt 173 lb 6.4 oz (78.654 kg)  BMI 24.88 kg/m2  SpO2 95% , BMI Body mass index is 24.88 kg/(m^2). GEN: Well nourished, well developed, in no acute distress HEENT: normal Neck: no JVD, carotid bruits, or masses Cardiac: RRR; no murmurs, rubs, or gallops,no edema  Respiratory:  clear to auscultation bilaterally, normal work of breathing GI: soft, nontender, nondistended, + BS MS: no deformity or atrophy Skin: warm and dry, no rash Neuro:  Strength and sensation are intact Psych: euthymic mood, full affect   EKG:  EKG was ordered today. And showed NSR with PAC's   Recent Labs: 05/01/2014: Hemoglobin 12.9*; Platelets 215 10/02/2014: BUN 20; Creatinine 1.5; Potassium 4.7; Sodium 138    Lipid Panel No results found for: CHOL, TRIG, HDL, CHOLHDL, VLDL, LDLCALC, LDLDIRECT    Wt Readings from Last 3 Encounters:  04/09/15 173 lb 6.4 oz (78.654 kg)  03/28/15 170 lb (77.111 kg)   10/02/14 175 lb (79.379 kg)    ASSESSMENT AND PLAN:  1. MVP with severe MR s/p MV repair 2. Afib s/p MAZE - maintaining NSR - continue Warfarin 3. Chronic LE edema with chronic diastolic CHF - continue Lasix/potassium - check BMET  4. OSA on CPAP - tolerating well. I will get a d/l from his DME   Current medicines are reviewed at length with the patient today.  The patient does not have concerns regarding medicines.  The following changes have been made:  no change  Labs/ tests ordered today include: see above assessment and plan No orders of the defined types were placed in this encounter.  Disposition:   FU with me in 6 months   Signed, Sueanne Margarita, MD  04/09/2015 9:23 AM    [facility] [place], [place], [place], [place]  [address] Phone: [phone]; Fax: [phone]

## 2015-04-17 ENCOUNTER — Other Ambulatory Visit (INDEPENDENT_AMBULATORY_CARE_PROVIDER_SITE_OTHER): Payer: Medicare Other | Admitting: *Deleted

## 2015-04-17 DIAGNOSIS — Z7901 Long term (current) use of anticoagulants: Secondary | ICD-10-CM | POA: Diagnosis not present

## 2015-04-17 DIAGNOSIS — I4891 Unspecified atrial fibrillation: Secondary | ICD-10-CM

## 2015-04-17 DIAGNOSIS — Z79899 Other long term (current) drug therapy: Secondary | ICD-10-CM | POA: Diagnosis not present

## 2015-04-17 DIAGNOSIS — I5022 Chronic systolic (congestive) heart failure: Secondary | ICD-10-CM | POA: Diagnosis not present

## 2015-04-17 DIAGNOSIS — Z5181 Encounter for therapeutic drug level monitoring: Secondary | ICD-10-CM

## 2015-04-17 DIAGNOSIS — S81009A Unspecified open wound, unspecified knee, initial encounter: Secondary | ICD-10-CM

## 2015-04-17 LAB — BASIC METABOLIC PANEL
BUN: 24 mg/dL — ABNORMAL HIGH (ref 6–23)
CO2: 31 meq/L (ref 19–32)
Calcium: 9.3 mg/dL (ref 8.4–10.5)
Chloride: 102 mEq/L (ref 96–112)
Creatinine, Ser: 1.7 mg/dL — ABNORMAL HIGH (ref 0.40–1.50)
GFR: 40.98 mL/min — AB (ref >60.00)
Glucose, Bld: 136 mg/dL — ABNORMAL HIGH (ref 70–99)
Potassium: 4 mEq/L (ref 3.5–5.1)
Sodium: 138 mEq/L (ref 135–145)

## 2015-04-17 NOTE — Addendum Note (Signed)
Addended by: Eulis Foster on: 04/17/2015 02:38 PM   Modules accepted: Orders

## 2015-04-19 ENCOUNTER — Telehealth: Payer: Self-pay | Admitting: Cardiology

## 2015-04-19 NOTE — Telephone Encounter (Signed)
Informed patient's granddaughter that [name]'s labs have been sent to his PCP for review.

## 2015-04-19 NOTE — Telephone Encounter (Signed)
Follow Up    Requesting most recent test results for pt. Please call.

## 2015-05-21 ENCOUNTER — Ambulatory Visit (INDEPENDENT_AMBULATORY_CARE_PROVIDER_SITE_OTHER): Payer: Medicare Other

## 2015-05-21 DIAGNOSIS — I4891 Unspecified atrial fibrillation: Secondary | ICD-10-CM

## 2015-05-21 DIAGNOSIS — Z5181 Encounter for therapeutic drug level monitoring: Secondary | ICD-10-CM | POA: Diagnosis not present

## 2015-05-21 DIAGNOSIS — Z7901 Long term (current) use of anticoagulants: Secondary | ICD-10-CM | POA: Diagnosis not present

## 2015-05-21 LAB — POCT INR: INR: 2.1

## 2015-06-05 ENCOUNTER — Encounter: Payer: Self-pay | Admitting: Cardiology

## 2015-07-02 ENCOUNTER — Ambulatory Visit (INDEPENDENT_AMBULATORY_CARE_PROVIDER_SITE_OTHER): Payer: Medicare Other | Admitting: *Deleted

## 2015-07-02 DIAGNOSIS — I4891 Unspecified atrial fibrillation: Secondary | ICD-10-CM | POA: Diagnosis not present

## 2015-07-02 DIAGNOSIS — Z5181 Encounter for therapeutic drug level monitoring: Secondary | ICD-10-CM

## 2015-07-02 DIAGNOSIS — Z7901 Long term (current) use of anticoagulants: Secondary | ICD-10-CM

## 2015-07-02 LAB — POCT INR: INR: 2.5

## 2015-08-13 ENCOUNTER — Ambulatory Visit (INDEPENDENT_AMBULATORY_CARE_PROVIDER_SITE_OTHER): Payer: Medicare Other

## 2015-08-13 DIAGNOSIS — Z7901 Long term (current) use of anticoagulants: Secondary | ICD-10-CM | POA: Diagnosis not present

## 2015-08-13 DIAGNOSIS — Z5181 Encounter for therapeutic drug level monitoring: Secondary | ICD-10-CM | POA: Diagnosis not present

## 2015-08-13 DIAGNOSIS — I4891 Unspecified atrial fibrillation: Secondary | ICD-10-CM | POA: Diagnosis not present

## 2015-08-13 LAB — POCT INR: INR: 2.6

## 2015-08-22 ENCOUNTER — Other Ambulatory Visit: Payer: Self-pay | Admitting: Cardiology

## 2015-09-06 ENCOUNTER — Ambulatory Visit: Payer: Medicare Other | Attending: Internal Medicine

## 2015-09-06 DIAGNOSIS — R269 Unspecified abnormalities of gait and mobility: Secondary | ICD-10-CM | POA: Insufficient documentation

## 2015-09-06 DIAGNOSIS — R42 Dizziness and giddiness: Secondary | ICD-10-CM | POA: Insufficient documentation

## 2015-09-06 DIAGNOSIS — R2681 Unsteadiness on feet: Secondary | ICD-10-CM | POA: Diagnosis present

## 2015-09-06 NOTE — Therapy (Signed)
[facility] [address] [place], [place], [address] Phone: [phone]   Fax:  [phone]  Physical Therapy Evaluation  Patient Details  Name: [name] MRN: [id number] Date of Birth: 1929-09-14 Referring Provider: Dr. Lavone Orn  Encounter Date: 09/06/2015      PT End of Session - 09/06/15 1136    Visit Number 1   Number of Visits 9   Date for PT Re-Evaluation 10/06/15   Authorization Type G-code every10 visit.   PT Start Time 0848   PT Stop Time 0932   PT Time Calculation (min) 44 min   Equipment Utilized During Treatment Gait belt   Activity Tolerance Patient tolerated treatment well   Behavior During Therapy WFL for tasks assessed/performed      Past Medical History  Diagnosis Date  . Heart palpitations     PT STATES NOT REALLY A BIG PROBLEM--DENIES CHEST PAINS  . Insomnia   . BPH (benign prostatic hypertrophy)   . Constipation   . History of torn meniscus of left knee     PAINFUL LEFT KNEE  . Hearing loss     BILATERAL - WEARS HEARING AIDS  . Mitral valve prolapse   . Mitral valve regurgitation   . OSA on CPAP   . Erectile dysfunction   . CHF (congestive heart failure) ([facility])   . Dysrhythmia     a fib  . Cancer ([facility])     SKIN CANCERS  . Arthritis     HANDS  . Atrial fibrillation ([facility]) 02/10/2013  . 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 AAA (abdominal aortic aneurysm) without rupture ([facility]) 03/03/2013    Small, partially thrombosed saccular aneurysm  . S/P Maze operation for atrial fibrillation 03/15/2013    Complete bilateral lesion set using cryothermy and bipolar radiofrequency ablation with oversewing of LA appendage via right mini thoracotomy  . Chronic diastolic congestive heart failure ([facility]) 03/20/2013  . Hyperlipidemia     statin intolerance  . Hyperlipidemia     Past Surgical History  Procedure Laterality Date  . Skin cancers removed from left forehead and left  nose    . Skin cancers removed from both legs    . Surgery left neck for removal of metastatic lymph node ( from cancer left forehead)  and left neck dissection-rest of lymph nodes negative for any cancer    . Carpal tunnel release      BILATERAL  . Right rotator cuff repair    . Tee without cardioversion N/A 01/31/2013    Procedure: TRANSESOPHAGEAL ECHOCARDIOGRAM (TEE);  Surgeon: Sueanne Margarita, MD;  Location: [facility];  Service: Cardiovascular;  Laterality: N/A;  . Hemorrhoid surgery      1973  . Mitral valve repair Right 03/15/2013    Procedure: MINIMALLY INVASIVE MITRAL VALVE REPAIR (MVR);  Surgeon: Rexene Alberts, MD;  Location: [facility];  Service: Open Heart Surgery;  Laterality: Right;  . Minimally invasive maze procedure Right 03/15/2013    Procedure: MINIMALLY INVASIVE MAZE PROCEDURE;  Surgeon: Rexene Alberts, MD;  Location: [facility];  Service: Open Heart Surgery;  Laterality: Right;  (R) AXILLARY CANNULATION  . Intraoperative transesophageal echocardiogram Right 03/15/2013    Procedure: INTRAOPERATIVE TRANSESOPHAGEAL ECHOCARDIOGRAM;  Surgeon: Rexene Alberts, MD;  Location: [facility];  Service: Open Heart Surgery;  Laterality: Right;    There were no vitals filed for this visit.  Visit Diagnosis:  Dizziness and giddiness - Plan: PT plan of care  cert/re-cert  Abnormality of gait - Plan: PT plan of care cert/re-cert  Unsteadiness - Plan: PT plan of care cert/re-cert      Subjective Assessment - 09/06/15 0856    Subjective Pt's dtr, [name], present during session. Pt reports dizziness occurs while he's playing golf and feels off balance. Pt reports he's had to decr. amount of golfing. Pt feels a pressure in his head when he's dizzy and describes dizziness as fuzzy. Pt reports some spinning but not much. Pt reports dizziness can last for a few hours, but had difficulty describing how long it lasts but reports severe dizziness subside quickly but he still feels "heavy-headed" for a few  hours.    Patient is accompained by: Family member  [name]-dtr   Pertinent History AAA(per [facility] records, pt not aware of this so PT advised pt to check this with PCP), HTN, Hx of skin CA, CHF, a-fib, CHF, h/o mitral valve repair, pernicious anemia, CKD stage III, DJD of knee, HOH   Patient Stated Goals Get back to playing golf   Currently in Pain? No/denies            [facility] PT Assessment - 09/06/15 [id number]    Assessment   Medical Diagnosis Dizziness   Referring Provider Dr. Lavone Orn   Onset Date/Surgical Date 07/07/15   Prior Therapy none   Precautions   Precautions Fall   Precaution Comments based on DGI score   Restrictions   Weight Bearing Restrictions No   Balance Screen   Has the patient fallen in the past 6 months No   How many times? pt reports close calls when he gets dizzy   Has the patient had a decrease in activity level because of a fear of falling?  Yes   Is the patient reluctant to leave their home because of a fear of falling?  No   Home Environment   Living Environment Private residence   Living Arrangements Alone   Available Help at Discharge Family   Type of Ord to enter   Entrance Stairs-Number of Steps 2   Entrance Stairs-Rails None   Home Layout Two level   Alternate Level Stairs-Number of Steps 12   Alternate Level Stairs-Rails Right   Home Equipment [place] - single point  when going up/down stairs   Prior Function   Level of Independence Independent   Leisure yardwork, golfing   Ambulation/Gait   Ambulation/Gait Yes   Ambulation/Gait Assistance 5: Supervision;4: Min guard   Ambulation/Gait Assistance Details Min guard while pt performed head turns 2/2 narrow BOS and incr. postural sway.   Ambulation Distance (Feet) 300 Feet   Assistive device None   Gait Pattern Step-through pattern;Decreased stride length;Decreased trunk rotation;Narrow base of support  decr. B heel strike   Ambulation Surface Level;Indoor   Gait  velocity 3.24ft/sec  no AD   Standardized Balance Assessment   Standardized Balance Assessment Dynamic Gait Index   Dynamic Gait Index   Level Surface Normal   Change in Gait Speed Normal   Gait with Horizontal Head Turns Mild Impairment   Gait with Vertical Head Turns Mild Impairment   Gait and Pivot Turn Mild Impairment   Step Over Obstacle Mild Impairment   Step Around Obstacles Mild Impairment   Steps Mild Impairment   Total Score 18            Vestibular Assessment - 09/06/15 0001    Vestibular Assessment   General Observation Pt reports  dizziness at its best 0/10 and at its worst: unable to rate a number but reported mild dizziness.   Symptom Behavior   Type of Dizziness --  Fuzzy in the head and pressure in head, some spinning   Frequency of Dizziness "It comes and goes, not every day"   Duration of Dizziness Pt unable to state how long acute dizziness last but reports "fuzzy" feeling can last up to a few hours   Aggravating Factors --  golfing, looking up   Relieving Factors Rest;Slow movements   Occulomotor Exam   Occulomotor Alignment Normal   Spontaneous Absent   Gaze-induced Absent   Smooth Pursuits Intact   Saccades Poor trajectory  B overshooting target   Comment Pt denied dizziness during B head thrust but had difficulty focusing on object. No saccades noted.   Vestibulo-Occular Reflex   VOR 1 Head Only (x 1 viewing) WFL   Positional Testing   Dix-Hallpike Dix-Hallpike Right;Dix-Hallpike Left   Horizontal Canal Testing Horizontal Canal Right;Horizontal Canal Left   Dix-Hallpike Right   Dix-Hallpike Right Duration no symptoms, but pt had difficulty relaxing neck into ext.   Dix-Hallpike Right Symptoms No nystagmus   Dix-Hallpike Left   Dix-Hallpike Left Duration no symptoms   Dix-Hallpike Left Symptoms No nystagmus   Horizontal Canal Right   Horizontal Canal Right Duration No symptoms    Horizontal Canal Right Symptoms Normal   Horizontal Canal Left    Horizontal Canal Left Duration No symptoms.   Horizontal Canal Left Symptoms Normal   Positional Sensitivities   Sit to Supine No dizziness   Supine to Sitting No dizziness                       PT Education - 09/06/15 1135    Education provided Yes   Education Details PT discussed frequency/duration. Discussed DGI and risk for falls and eval consistent with hypofunctioning of vestibular system.   Person(s) Educated Patient;Child(ren)   Methods Explanation   Comprehension Verbalized understanding          PT Short Term Goals - 09/06/15 1140    PT SHORT TERM GOAL #1   Title same as LTGs           PT Long Term Goals - 09/06/15 1140    PT LONG TERM GOAL #1   Title Pt will be IND in HEP to improve strength, balance and safety. Target date: 10/04/15.   Status New   PT LONG TERM GOAL #2   Title Pt will improve DGI score to >/=20/24 to decr. falls risk. Target date: 10/04/15.   Status New   PT LONG TERM GOAL #3   Title Pt will report 0/10 dizziness while performing ADLs and golfing to improve safety. Target date: 10/04/15.   Status New   PT LONG TERM GOAL #4   Title Pt will ambulate 600' over even/uneven terrain with LRAD, at MOD I level, in order to improve functional mobility. Target date: 10/04/15.   Status New   PT LONG TERM GOAL #5   Title Write FOTO goal if appropriate. Target date: 10/04/15.   Status New               Plan - 09/06/15 1136    Clinical Impression Statement Pt is a pleasant 81y/o male presenting to OPPT neuro with dizziness. Pt presented with impaired balance, gait deviations, decreased strength, and risk for falls based on DGI score. PT could not provoke dizziness, but exam  findings are consistent with decr. input from vestibular system. B Dix-Hallpike and B roll testing negative but PT will continue to monitor dizzy symptoms and will treat prn.    Pt will benefit from skilled therapeutic intervention in order to improve on the  following deficits Abnormal gait;Decreased endurance;Decreased knowledge of use of DME;Decreased strength;Postural dysfunction;Decreased safety awareness;Decreased range of motion;Dizziness;Decreased balance;Decreased mobility   Rehab Potential Good   Clinical Impairments Affecting Rehab Potential Pt is not certain he needs PT for balance, despite PT providing extensive education    PT Frequency 2x / week   PT Duration 4 weeks   PT Treatment/Interventions ADLs/Self Care Home Management;Neuromuscular re-education;Biofeedback;Canalith Repostioning;DME Instruction;Patient/family education;Gait training;Stair training;Functional mobility training;Therapeutic activities;Therapeutic exercise;Balance training;Vestibular   PT Next Visit Plan Assess orthostatics and provide balance HEP.   Consulted and Agree with Plan of Care Patient;Family member/caregiver   Family Member Consulted dtr-[name]          G-Codes - 12-Sep-2015 1142    Functional Assessment Tool Used DGI: 18/24, dizziness: mild but unable to rate   Functional Limitation Mobility: Walking and moving around   Mobility: Walking and Moving Around Current Status [phone]) At least 40 percent but less than 60 percent impaired, limited or restricted   Mobility: Walking and Moving Around Goal Status [phone]) At least 1 percent but less than 20 percent impaired, limited or restricted       Problem List Patient Active Problem List   Diagnosis Date Noted  . S/P MVR (mitral valve repair) 03/28/2015  . Encounter for therapeutic drug monitoring 12/26/2013  . OSA on CPAP   . Chronic diastolic congestive heart failure ([facility]) 03/20/2013  . S/P Maze operation for atrial fibrillation 03/15/2013  . MR (mitral regurgitation) 03/03/2013  . PAF (paroxysmal atrial fibrillation) ([facility]) 03/03/2013  . Spinal stenosis, thoracic 03/03/2013  . AAA (abdominal aortic aneurysm) without rupture ([facility]) 03/03/2013  . Mitral valve prolapse, nonrheumatic 01/24/2013     Miller,Jennifer L Sep 12, 2015, 11:44 AM  [facility] [address] [place], [place], [address] Phone: [phone]   Fax:  [phone]  Name: [name] MRN: [id number] Date of Birth: 1929/03/18   Geoffry Paradise, PT,DPT 2015/09/12 11:44 AM Phone: [phone] Fax: [phone]

## 2015-09-12 ENCOUNTER — Ambulatory Visit: Payer: Medicare Other

## 2015-09-12 DIAGNOSIS — R42 Dizziness and giddiness: Secondary | ICD-10-CM | POA: Diagnosis not present

## 2015-09-12 DIAGNOSIS — R269 Unspecified abnormalities of gait and mobility: Secondary | ICD-10-CM

## 2015-09-12 NOTE — Therapy (Signed)
[facility] [address] [place], [place], [address] Phone: [phone]   Fax:  [phone]  Physical Therapy Treatment  Patient Details  Name: [name] MRN: [id number] Date of Birth: 1929-04-25 Referring Provider: Dr. Lavone Orn  Encounter Date: 09/12/2015      PT End of Session - 09/12/15 0932    Visit Number 2   Number of Visits 9   Date for PT Re-Evaluation 10/06/15   Authorization Type G-code every10 visit.   PT Start Time 0848   PT Stop Time 0928   PT Time Calculation (min) 40 min   Equipment Utilized During Treatment Gait belt   Activity Tolerance Patient tolerated treatment well   Behavior During Therapy WFL for tasks assessed/performed      Past Medical History  Diagnosis Date  . Heart palpitations     PT STATES NOT REALLY A BIG PROBLEM--DENIES CHEST PAINS  . Insomnia   . BPH (benign prostatic hypertrophy)   . Constipation   . History of torn meniscus of left knee     PAINFUL LEFT KNEE  . Hearing loss     BILATERAL - WEARS HEARING AIDS  . Mitral valve prolapse   . Mitral valve regurgitation   . OSA on CPAP   . Erectile dysfunction   . CHF (congestive heart failure) ([facility])   . Dysrhythmia     a fib  . Cancer ([facility])     SKIN CANCERS  . Arthritis     HANDS  . Atrial fibrillation ([place]) 02/10/2013  . 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 AAA (abdominal aortic aneurysm) without rupture ([facility]) 03/03/2013    Small, partially thrombosed saccular aneurysm  . S/P Maze operation for atrial fibrillation 03/15/2013    Complete bilateral lesion set using cryothermy and bipolar radiofrequency ablation with oversewing of LA appendage via right mini thoracotomy  . Chronic diastolic congestive heart failure ([facility]) 03/20/2013  . Hyperlipidemia     statin intolerance  . Hyperlipidemia     Past Surgical History  Procedure Laterality Date  . Skin cancers removed from left forehead and left nose     . Skin cancers removed from both legs    . Surgery left neck for removal of metastatic lymph node ( from cancer left forehead)  and left neck dissection-rest of lymph nodes negative for any cancer    . Carpal tunnel release      BILATERAL  . Right rotator cuff repair    . Tee without cardioversion N/A 01/31/2013    Procedure: TRANSESOPHAGEAL ECHOCARDIOGRAM (TEE);  Surgeon: Sueanne Margarita, MD;  Location: [facility];  Service: Cardiovascular;  Laterality: N/A;  . Hemorrhoid surgery      1973  . Mitral valve repair Right 03/15/2013    Procedure: MINIMALLY INVASIVE MITRAL VALVE REPAIR (MVR);  Surgeon: Rexene Alberts, MD;  Location: [facility];  Service: Open Heart Surgery;  Laterality: Right;  . Minimally invasive maze procedure Right 03/15/2013    Procedure: MINIMALLY INVASIVE MAZE PROCEDURE;  Surgeon: Rexene Alberts, MD;  Location: [facility];  Service: Open Heart Surgery;  Laterality: Right;  (R) AXILLARY CANNULATION  . Intraoperative transesophageal echocardiogram Right 03/15/2013    Procedure: INTRAOPERATIVE TRANSESOPHAGEAL ECHOCARDIOGRAM;  Surgeon: Rexene Alberts, MD;  Location: [facility];  Service: Open Heart Surgery;  Laterality: Right;    There were no vitals filed for this visit.  Visit Diagnosis:  Abnormality of gait      Subjective  Assessment - 09/12/15 0851    Subjective Pt denied falls or dizziness since last visit. Pt he has been playing golf without dizziness since last session.   Pertinent History AAA(per [facility] records, pt not aware of this so PT advised pt to check this with PCP), HTN, Hx of skin CA, CHF, a-fib, CHF, h/o mitral valve repair, pernicious anemia, CKD stage III, DJD of knee, HOH   Patient Stated Goals Get back to playing golf   Currently in Pain? Yes   Pain Score 1    Pain Location Knee   Pain Orientation Right;Left   Pain Descriptors / Indicators Aching   Pain Type Chronic pain   Pain Onset More than a month ago   Pain Frequency Intermittent   Aggravating Factors   sitting for prolonged periods of time, kneeling while performing yard work   Pain Relieving Factors walking and movement                Vestibular Assessment - 09/12/15 0001    Orthostatics   BP supine (x 5 minutes) 128/81 mmHg   HR supine (x 5 minutes) 76   BP sitting 128/77 mmHg  no c/o dizziness/lightheadedness   HR sitting 70   BP standing (after 1 minute) 130/73 mmHg   HR standing (after 1 minute) 73  no c/o dizziness/lightheadedness        Neuro re-ed: Pt performed balance HEP in //bars with 0-1 UE support and supervision-min guard but did require min A during 4 LOB episodes. Please see pt instructions for details.                 PT Education - 09/12/15 0931    Education provided Yes   Education Details Balance HEP and orthostatic hypotension results (negative). PT also discussed potentially discharging pt early, based on progress as he reports dizziness getting better.   Person(s) Educated Patient   Methods Explanation;Demonstration;Verbal cues;Handout;Tactile cues   Comprehension Returned demonstration;Verbalized understanding;Need further instruction          PT Short Term Goals - 09/06/15 1140    PT SHORT TERM GOAL #1   Title same as LTGs           PT Long Term Goals - 09/12/15 [id number]    PT LONG TERM GOAL #1   Title Pt will be IND in HEP to improve strength, balance and safety. Target date: 10/04/15.   Status On-going   PT LONG TERM GOAL #2   Title Pt will improve DGI score to >/=20/24 to decr. falls risk. Target date: 10/04/15.   Status On-going   PT LONG TERM GOAL #3   Title Pt will report 0/10 dizziness while performing ADLs and golfing to improve safety. Target date: 10/04/15.   Status On-going   PT LONG TERM GOAL #4   Title Pt will ambulate 600' over even/uneven terrain with LRAD, at MOD I level, in order to improve functional mobility. Target date: 10/04/15.   Status On-going   PT LONG TERM GOAL #5   Title Write FOTO goal if  appropriate. Target date: 10/04/15.   Status Achieved   Additional Long Term Goals   Additional Long Term Goals Yes   PT LONG TERM GOAL #6   Title Pt will improve score to </=12 on [place] in order to improve quality of life. Target date: 10/04/15.   Status New               Plan - 09/12/15 [id number]  Clinical Impression Statement Pt did not experience a significant decrease in BP during orthostatic testing, and he denied dizziness/lightheadedness indicating is likely does not have orthostatic hypotension. Pt did experience increase postural sway during dynamic gait activities and required one UE support on // bars or min A to maintain balance during 4 LOB episodes. Pt would continue to benefit from skilled PT to improve safety during functional mobility.    Pt will benefit from skilled therapeutic intervention in order to improve on the following deficits Abnormal gait;Decreased endurance;Decreased knowledge of use of DME;Decreased strength;Postural dysfunction;Decreased safety awareness;Decreased range of motion;Dizziness;Decreased balance;Decreased mobility   Rehab Potential Good   Clinical Impairments Affecting Rehab Potential Pt is not certain he needs PT for balance, despite PT providing extensive education    PT Frequency 2x / week   PT Duration 4 weeks   PT Treatment/Interventions ADLs/Self Care Home Management;Neuromuscular re-education;Biofeedback;Canalith Repostioning;DME Instruction;Patient/family education;Gait training;Stair training;Functional mobility training;Therapeutic activities;Therapeutic exercise;Balance training;Vestibular   PT Next Visit Plan High level balance activities over compliant surfaces and gait training outdoors (obstacles, uneven surfaces)   PT Home Exercise Plan Balance HEP   Consulted and Agree with Plan of Care Patient        Problem List Patient Active Problem List   Diagnosis Date Noted  . S/P MVR (mitral valve repair) 03/28/2015  . Encounter for  therapeutic drug monitoring 12/26/2013  . OSA on CPAP   . Chronic diastolic congestive heart failure ([facility] [place]) 03/20/2013  . S/P Maze operation for atrial fibrillation 03/15/2013  . MR (mitral regurgitation) 03/03/2013  . PAF (paroxysmal atrial fibrillation) ([facility]) 03/03/2013  . Spinal stenosis, thoracic 03/03/2013  . AAA (abdominal aortic aneurysm) without rupture ([facility]) 03/03/2013  . Mitral valve prolapse, nonrheumatic 01/24/2013    [place] Selke L 09/12/2015, 9:42 AM  [facility] [address] [place], [place], [address] Phone: [phone]   Fax:  [phone]  Name: KELI LOY MRN: [id number] Date of Birth: 1929-08-05    Geoffry Paradise, PT,DPT 09/12/2015 9:42 AM Phone: [phone] Fax: [phone]

## 2015-09-12 NOTE — Patient Instructions (Addendum)
Marching Along the Counter    Standing straight, alternate bringing knees toward trunk, walk 10 feet along counter while holding on with one hand. Do _4__ sets. Do _1__ times per day.  AMBULATION: Walk Backward    Walk 10 feet backwards, while holding on to counter with one hand. Take large steps, do not drag feet. _4__ reps per set, _1__ sets per day, __7_ days per week.  Copyright  VHI. All rights reserved.   Forward Walk    Walk 10 feet forward with one hand on the counter, with eyes closed. Session: Perform 4 times. Do __7__ sessions per week.   Copyright  VHI. All rights reserved.   Braiding    Move to side, while holding onto counter: 1) cross right leg in front of left, 2) bring back leg out to side, then 3) cross right leg behind left, 4) bring left leg out to side. Continue sequence in same direction. Reverse sequence, moving in opposite direction. Repeat sequence __4__ times per session. Do __1__ sessions per day.   Copyright  VHI. All rights reserved.   Walking Head Turn    Walking along counter with one hand holding onto counter, walk __10__ feet while turning to the right side for two steps and to the left for 2 steps. Repeat __4__ times. Do __1__ sessions per day. THEN: perform while looking up for two steps and then down for two steps, repeat 4 times per day.  [email or web address].exer.us/535   Copyright  VHI. All rights reserved.

## 2015-09-17 ENCOUNTER — Ambulatory Visit: Payer: Medicare Other

## 2015-09-17 DIAGNOSIS — R2681 Unsteadiness on feet: Secondary | ICD-10-CM

## 2015-09-17 DIAGNOSIS — R269 Unspecified abnormalities of gait and mobility: Secondary | ICD-10-CM

## 2015-09-17 DIAGNOSIS — R42 Dizziness and giddiness: Secondary | ICD-10-CM | POA: Diagnosis not present

## 2015-09-17 NOTE — Therapy (Signed)
[facility] [address] [place], [place], [address] Phone: [phone]   Fax:  [phone]  Physical Therapy Treatment  Patient Details  Name: [name] MRN: [id number] Date of Birth: [birth year] Referring Provider: Dr. Lavone Orn  Encounter Date: 09/17/2015      PT End of Session - 09/17/15 1246    Visit Number 3   Number of Visits 9   Date for PT Re-Evaluation 10/06/15   Authorization Type G-code every10 visit.   PT Start Time 1147   PT Stop Time 1229   PT Time Calculation (min) 42 min   Equipment Utilized During Treatment Gait belt   Activity Tolerance Patient tolerated treatment well   Behavior During Therapy WFL for tasks assessed/performed      Past Medical History  Diagnosis Date  . Heart palpitations     PT STATES NOT REALLY A BIG PROBLEM--DENIES CHEST PAINS  . Insomnia   . BPH (benign prostatic hypertrophy)   . Constipation   . History of torn meniscus of left knee     PAINFUL LEFT KNEE  . Hearing loss     BILATERAL - WEARS HEARING AIDS  . Mitral valve prolapse   . Mitral valve regurgitation   . OSA on CPAP   . Erectile dysfunction   . CHF (congestive heart failure) ([place])   . Dysrhythmia     a fib  . Cancer ([place])     SKIN CANCERS  . Arthritis     HANDS  . Atrial fibrillation ([place]) 02/10/2013  . 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 AAA (abdominal aortic aneurysm) without rupture ([place]) 03/03/2013    Small, partially thrombosed saccular aneurysm  . S/P Maze operation for atrial fibrillation 03/15/2013    Complete bilateral lesion set using cryothermy and bipolar radiofrequency ablation with oversewing of LA appendage via right mini thoracotomy  . Chronic diastolic congestive heart failure ([place]) 03/20/2013  . Hyperlipidemia     statin intolerance  . Hyperlipidemia     Past Surgical History  Procedure Laterality Date  . Skin cancers removed from left forehead and left nose     . Skin cancers removed from both legs    . Surgery left neck for removal of metastatic lymph node ( from cancer left forehead)  and left neck dissection-rest of lymph nodes negative for any cancer    . Carpal tunnel release      BILATERAL  . Right rotator cuff repair    . Tee without cardioversion N/A 01/31/2013    Procedure: TRANSESOPHAGEAL ECHOCARDIOGRAM (TEE);  Surgeon: Sueanne Margarita, MD;  Location: [facility];  Service: Cardiovascular;  Laterality: N/A;  . Hemorrhoid surgery      1973  . Mitral valve repair Right 03/15/2013    Procedure: MINIMALLY INVASIVE MITRAL VALVE REPAIR (MVR);  Surgeon: Rexene Alberts, MD;  Location: [facility];  Service: Open Heart Surgery;  Laterality: Right;  . Minimally invasive maze procedure Right 03/15/2013    Procedure: MINIMALLY INVASIVE MAZE PROCEDURE;  Surgeon: Rexene Alberts, MD;  Location: [facility];  Service: Open Heart Surgery;  Laterality: Right;  (R) AXILLARY CANNULATION  . Intraoperative transesophageal echocardiogram Right 03/15/2013    Procedure: INTRAOPERATIVE TRANSESOPHAGEAL ECHOCARDIOGRAM;  Surgeon: Rexene Alberts, MD;  Location: [facility];  Service: Open Heart Surgery;  Laterality: Right;    There were no vitals filed for this visit.  Visit Diagnosis:  Abnormality of gait  Unsteadiness  Subjective Assessment - 09/17/15 1149    Subjective Pt denied falls and denied dizziness since last session. Pt reported no dizziness while playing golf.    Pertinent History AAA(per [facility] records, pt not aware of this so PT advised pt to check this with PCP), HTN, Hx of skin CA, CHF, a-fib, CHF, h/o mitral valve repair, pernicious anemia, CKD stage III, DJD of knee, HOH   Patient Stated Goals Get back to playing golf   Currently in Pain? No/denies                         [facility] Adult PT Treatment/Exercise - 09/17/15 1242    Ambulation/Gait   Ambulation/Gait Yes   Ambulation/Gait Assistance 5: Supervision;4: Min guard   Ambulation/Gait  Assistance Details Supervision while ambulating over uneven terrain and performing head turns. Min guard while ambulating over grassy terrain while performing head turns. Cues to improve stride length and heel strike.   Ambulation Distance (Feet) 550 Feet   Assistive device None   Gait Pattern Step-through pattern;Decreased stride length;Decreased trunk rotation;Narrow base of support   Ambulation Surface Level;Unlevel;Indoor;Outdoor;Paved;Gravel;Grass   High Level Balance   High Level Balance Activities Braiding;Backward walking;Turns;Head turns;Marching forwards;Negotitating around obstacles;Negotiating over obstacles  Outdoors performed x4 (hurdles and cones)   High Level Balance Comments Outdoors: pt performed negotiating hurdles, cone obstacles, and cone taps on sidewalk and grass with min guard to min A to maintain balance. Pt performed high level balance HEP review in // bars with cues and demonstration for technique. Please see pt instructions for details.           Self Care:     PT Education - 09/17/15 1245    Education provided Yes   Education Details PT reviewed balance HEP, as pt reported he has not been performing at home very often. Pt requesting to cease PT early, as dizziness has subsided. PT reiterated the importance of performing HEP on the days pt does not golf and will assess goals next session and d/c per pt request.   Person(s) Educated Patient   Methods Demonstration;Explanation;Verbal cues;Handout   Comprehension Verbalized understanding;Returned demonstration          PT Short Term Goals - 09/06/15 1140    PT SHORT TERM GOAL #1   Title same as LTGs           PT Long Term Goals - 09/17/15 1249    PT LONG TERM GOAL #1   Title Pt will be IND in HEP to improve strength, balance and safety. Target date: 10/04/15.   Status On-going   PT LONG TERM GOAL #2   Title Pt will improve DGI score to >/=20/24 to decr. falls risk. Target date: 10/04/15.   Status  On-going   PT LONG TERM GOAL #3   Title Pt will report 0/10 dizziness while performing ADLs and golfing to improve safety. Target date: 10/04/15.   Status Achieved   PT LONG TERM GOAL #4   Title Pt will ambulate 600' over even/uneven terrain with LRAD, at MOD I level, in order to improve functional mobility. Target date: 10/04/15.   Status On-going   PT LONG TERM GOAL #5   Title Write FOTO goal if appropriate. Target date: 10/04/15.   Status Achieved   PT LONG TERM GOAL #6   Title Pt will improve score to </=12 on [place] in order to improve quality of life. Target date: 10/04/15.   Status New  Plan - 09/17/15 1247    Clinical Impression Statement Pt demonstrated progress as he was able to perform HEP with intermittent UE support on // bars and still maintain balance. However, pt did require UE support during LOB episodes while performing head turns and amb. with eyes closed. PT continues to experience incr. postural sway during amb. with head turns, indicating decr. vestibular input. PT will assess goals and d/c pt next session per pt request. Continue with POC.   Pt will benefit from skilled therapeutic intervention in order to improve on the following deficits Abnormal gait;Decreased endurance;Decreased knowledge of use of DME;Decreased strength;Postural dysfunction;Decreased safety awareness;Decreased range of motion;Dizziness;Decreased balance;Decreased mobility   Rehab Potential Good   Clinical Impairments Affecting Rehab Potential Pt is not certain he needs PT for balance, despite PT providing extensive education    PT Frequency 2x / week   PT Duration 4 weeks   PT Treatment/Interventions ADLs/Self Care Home Management;Neuromuscular re-education;Biofeedback;Canalith Repostioning;DME Instruction;Patient/family education;Gait training;Stair training;Functional mobility training;Therapeutic activities;Therapeutic exercise;Balance training;Vestibular   PT Next Visit Plan  G-CODE, check LTGs and d/c.   PT Home Exercise Plan Balance HEP   Consulted and Agree with Plan of Care Patient        Problem List Patient Active Problem List   Diagnosis Date Noted  . S/P MVR (mitral valve repair) 03/28/2015  . Encounter for therapeutic drug monitoring 12/26/2013  . OSA on CPAP   . Chronic diastolic congestive heart failure ([place]) 03/20/2013  . S/P Maze operation for atrial fibrillation 03/15/2013  . MR (mitral regurgitation) 03/03/2013  . PAF (paroxysmal atrial fibrillation) ([place]) 03/03/2013  . Spinal stenosis, thoracic 03/03/2013  . AAA (abdominal aortic aneurysm) without rupture ([place]) 03/03/2013  . Mitral valve prolapse, nonrheumatic 01/24/2013    [name] 09/17/2015, 12:50 PM  [facility] [address] [place], [place], [address] Phone: [phone]   Fax:  [phone]  Name: [name] MRN: [id number] Date of Birth: 1929/10/20    Geoffry Paradise, PT,DPT 09/17/2015 12:50 PM Phone: [phone] Fax: [phone]

## 2015-09-17 NOTE — Patient Instructions (Signed)
Perform exercises at counter, with hands on counter for support and safety. Perform exercises on the days that you DO NOT go golfing, to improve balance.  Marching Along the Counter    Standing straight, alternate bringing knees toward trunk (hips/knees at 90 degree angles), walk 10 feet along counter while holding on with one hand. Do _4__ sets. Do _1__ times per day.  AMBULATION: Walk Backward    Walk 10 feet backwards, while holding on to counter with one hand. Take large steps, do not drag feet. _4__ reps per set, _1__ sets per day, __7_ days per week.  Copyright  VHI. All rights reserved.   Forward Walk    Walk 10 feet forward with one hand on the counter, with eyes closed. Session: Perform 4 times. Do __7__ sessions per week.   Copyright  VHI. All rights reserved.   Braiding    Move to side, while holding onto counter: 1) cross right leg in front of left, 2) bring back leg out to side, then 3) cross right leg behind left, 4) bring left leg out to side. Continue sequence in same direction. Reverse sequence, moving in opposite direction. Repeat sequence __4__ times per session. Do __1__ sessions per day.   Copyright  VHI. All rights reserved.   Walking Head Turn    Walking along counter with one hand holding onto counter, walk __10__ feet while turning to the right side for two steps and to the left for 2 steps. Repeat __4__ times. Do __1__ sessions per day. THEN: perform while looking up for two steps and then down for two steps, repeat 4 times per day.  http://[email or web address].exer.us/535   Copyright  VHI. All rights reserved.

## 2015-09-19 ENCOUNTER — Ambulatory Visit: Payer: Medicare Other

## 2015-09-19 DIAGNOSIS — R42 Dizziness and giddiness: Secondary | ICD-10-CM

## 2015-09-19 DIAGNOSIS — R2681 Unsteadiness on feet: Secondary | ICD-10-CM

## 2015-09-19 DIAGNOSIS — R269 Unspecified abnormalities of gait and mobility: Secondary | ICD-10-CM

## 2015-09-19 NOTE — Therapy (Signed)
[facility] [address] [place], [place], [address] Phone: [phone]   Fax:  [phone]  Physical Therapy Treatment  Patient Details  Name: [name] MRN: [id number] Date of Birth: December 18, 1928 Referring Provider: Dr. Lavone Orn  Encounter Date: 09/19/2015      PT End of Session - 09/19/15 0914    Visit Number 4   Number of Visits 9   Date for PT Re-Evaluation 10/06/15   Authorization Type G-code every10 visit.   PT Start Time [phone]   PT Stop Time 0911  pt spent the rest of session completing FOTO   PT Time Calculation (min) 24 min   Equipment Utilized During Treatment Gait belt   Activity Tolerance Patient tolerated treatment well   Behavior During Therapy [facility] for tasks assessed/performed      Past Medical History  Diagnosis Date  . Heart palpitations     PT STATES NOT REALLY A BIG PROBLEM--DENIES CHEST PAINS  . Insomnia   . BPH (benign prostatic hypertrophy)   . Constipation   . History of torn meniscus of left knee     PAINFUL LEFT KNEE  . Hearing loss     BILATERAL - WEARS HEARING AIDS  . Mitral valve prolapse   . Mitral valve regurgitation   . OSA on CPAP   . Erectile dysfunction   . CHF (congestive heart failure) ([facility])   . Dysrhythmia     a fib  . Cancer ([facility] [place])     SKIN CANCERS  . Arthritis     HANDS  . Atrial fibrillation ([place]) 02/10/2013  . 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 AAA (abdominal aortic aneurysm) without rupture ([place]) 03/03/2013    Small, partially thrombosed saccular aneurysm  . S/P Maze operation for atrial fibrillation 03/15/2013    Complete bilateral lesion set using cryothermy and bipolar radiofrequency ablation with oversewing of LA appendage via right mini thoracotomy  . Chronic diastolic congestive heart failure ([place]) 03/20/2013  . Hyperlipidemia     statin intolerance  . Hyperlipidemia     Past Surgical History  Procedure Laterality Date  . Skin  cancers removed from left forehead and left nose    . Skin cancers removed from both legs    . Surgery left neck for removal of metastatic lymph node ( from cancer left forehead)  and left neck dissection-rest of lymph nodes negative for any cancer    . Carpal tunnel release      BILATERAL  . Right rotator cuff repair    . Tee without cardioversion N/A 01/31/2013    Procedure: TRANSESOPHAGEAL ECHOCARDIOGRAM (TEE);  Surgeon: Sueanne Margarita, MD;  Location: [facility];  Service: Cardiovascular;  Laterality: N/A;  . Hemorrhoid surgery      1973  . Mitral valve repair Right 03/15/2013    Procedure: MINIMALLY INVASIVE MITRAL VALVE REPAIR (MVR);  Surgeon: Rexene Alberts, MD;  Location: [facility];  Service: Open Heart Surgery;  Laterality: Right;  . Minimally invasive maze procedure Right 03/15/2013    Procedure: MINIMALLY INVASIVE MAZE PROCEDURE;  Surgeon: Rexene Alberts, MD;  Location: [facility];  Service: Open Heart Surgery;  Laterality: Right;  (R) AXILLARY CANNULATION  . Intraoperative transesophageal echocardiogram Right 03/15/2013    Procedure: INTRAOPERATIVE TRANSESOPHAGEAL ECHOCARDIOGRAM;  Surgeon: Rexene Alberts, MD;  Location: [facility];  Service: Open Heart Surgery;  Laterality: Right;    There were no vitals filed for this visit.  Visit Diagnosis:  Abnormality of gait  Unsteadiness  Dizziness and giddiness      Subjective Assessment - 09/19/15 0848    Subjective Pt denied falls or dizziness since last session.    Pertinent History AAA(per [facility] records, pt not aware of this so PT advised pt to check this with PCP), HTN, Hx of skin CA, CHF, a-fib, CHF, h/o mitral valve repair, pernicious anemia, CKD stage III, DJD of knee, HOH   Patient Stated Goals Get back to playing golf   Currently in Pain? No/denies                         [facility] Adult PT Treatment/Exercise - 09/19/15 0855    Ambulation/Gait   Ambulation/Gait Yes   Ambulation/Gait Assistance 6: Modified independent  (Device/Increase time)   Ambulation/Gait Assistance Details Pt ambulated over even/uneven terrain with SPC (pt uses SPC at home in the morning). No LOB noted when pt used SPC. Pt noted to experience improved stride length and heel strike when using SPC.   Ambulation Distance (Feet) 700 Feet   Assistive device None   Gait Pattern Decreased trunk rotation   Ambulation Surface Unlevel;Level;Indoor;Outdoor;Paved;Grass   Standardized Balance Assessment   Standardized Balance Assessment Dynamic Gait Index   Dynamic Gait Index   Level Surface Normal   Change in Gait Speed Normal   Gait with Horizontal Head Turns Normal   Gait with Vertical Head Turns Normal   Gait and Pivot Turn Mild Impairment   Step Over Obstacle Normal   Step Around Obstacles Normal   Steps Mild Impairment   Total Score 22           Self Care:     PT Education - 09/19/15 0912    Education provided Yes   Education Details PT reiterated the importance of performing HEP after d/c from PT, to maintain/improve gains amde during therapy. PT also encouraged pt to use SPC when amb. over grassy/uneven terrain and longer distances. Pt reported his knees give him trouble 2/2 arthritis and PT encouraged pt to inform MD and trial PT if MD feels that would be appropriate to reduce knee pain.   Person(s) Educated Patient   Methods Explanation   Comprehension Verbalized understanding          PT Short Term Goals - 09/06/15 1140    PT SHORT TERM GOAL #1   Title same as LTGs           PT Long Term Goals - 09/19/15 0915    PT LONG TERM GOAL #1   Title Pt will be IND in HEP to improve strength, balance and safety. Target date: 10/04/15.   Status Partially Met   PT LONG TERM GOAL #2   Title Pt will improve DGI score to >/=20/24 to decr. falls risk. Target date: 10/04/15.   Status Achieved   PT LONG TERM GOAL #3   Title Pt will report 0/10 dizziness while performing ADLs and golfing to improve safety. Target date:  10/04/15.   Status Achieved   PT LONG TERM GOAL #4   Title Pt will ambulate 600' over even/uneven terrain with LRAD, at MOD I level, in order to improve functional mobility. Target date: 10/04/15.   Status On-going   PT LONG TERM GOAL #5   Title Write FOTO goal if appropriate. Target date: 10/04/15.   Status Achieved   PT LONG TERM GOAL #6   Title Pt will improve score to </=12 on [place] in  order to improve quality of life. Target date: 10/04/15.   Status Not Met               Plan - 10-01-15 0914    Clinical Impression Statement Pt met LTGs 2, 3, 4. Pt partially met LTG 1, as he required cues for technique last visit. Pt did not meet LTG 6, this could be due to the fact that pt is now more aware of deficits, as he did not feel that his balance was an issue during initial eval. Pt requesting d/c, as he is no longer dizzy and is busy selling his house. Please see d/c summary for details.          G-Codes - 01-Oct-2015 4268    Functional Assessment Tool Used DGI: 22/24, dizziness: no dizziness (0/10)   Functional Limitation Mobility: Walking and moving around   Mobility: Walking and Moving Around Goal Status [phone]) At least 1 percent but less than 20 percent impaired, limited or restricted   Mobility: Walking and Moving Around Discharge Status [phone]) At least 1 percent but less than 20 percent impaired, limited or restricted      Problem List Patient Active Problem List   Diagnosis Date Noted  . S/P MVR (mitral valve repair) 03/28/2015  . Encounter for therapeutic drug monitoring 12/26/2013  . OSA on CPAP   . Chronic diastolic congestive heart failure ([place]) 03/20/2013  . S/P Maze operation for atrial fibrillation 03/15/2013  . MR (mitral regurgitation) 03/03/2013  . PAF (paroxysmal atrial fibrillation) ([place]) 03/03/2013  . Spinal stenosis, thoracic 03/03/2013  . AAA (abdominal aortic aneurysm) without rupture ([place]) 03/03/2013  . Mitral valve prolapse, nonrheumatic 01/24/2013     Dannika Hilgeman L 10/01/2015, 9:27 AM  [facility] [address] [place], [place], [address] Phone: [phone]   Fax:  [phone]  Name: [name] MRN: [id number] Date of Birth: [birth year]  PHYSICAL THERAPY DISCHARGE SUMMARY  Visits from Start of Care: 4  Current functional level related to goals / functional outcomes:     PT Long Term Goals - 2015-10-01 0915    PT LONG TERM GOAL #1   Title Pt will be IND in HEP to improve strength, balance and safety. Target date: 10/04/15.   Status Partially Met   PT LONG TERM GOAL #2   Title Pt will improve DGI score to >/=20/24 to decr. falls risk. Target date: 10/04/15.   Status Achieved   PT LONG TERM GOAL #3   Title Pt will report 0/10 dizziness while performing ADLs and golfing to improve safety. Target date: 10/04/15.   Status Achieved   PT LONG TERM GOAL #4   Title Pt will ambulate 600' over even/uneven terrain with LRAD, at MOD I level, in order to improve functional mobility. Target date: 10/04/15.   Status On-going   PT LONG TERM GOAL #5   Title Write FOTO goal if appropriate. Target date: 10/04/15.   Status Achieved   PT LONG TERM GOAL #6   Title Pt will improve score to </=12 on [place] in order to improve quality of life. Target date: 10/04/15.   Status Not Met        Remaining deficits: Impaired balance over grassy terrain when not using SPC.    Education / Equipment: HEP and safety  Plan: Patient agrees to discharge.  Patient goals were met. Patient is being discharged due to meeting the stated rehab goals.  ?????  Geoffry Paradise, PT,DPT 09/19/2015 9:27 AM Phone: [phone] Fax: [phone]

## 2015-09-24 ENCOUNTER — Ambulatory Visit (INDEPENDENT_AMBULATORY_CARE_PROVIDER_SITE_OTHER): Payer: Medicare Other | Admitting: Pharmacist

## 2015-09-24 DIAGNOSIS — Z5181 Encounter for therapeutic drug level monitoring: Secondary | ICD-10-CM | POA: Diagnosis not present

## 2015-09-24 DIAGNOSIS — Z7901 Long term (current) use of anticoagulants: Secondary | ICD-10-CM

## 2015-09-24 DIAGNOSIS — I4891 Unspecified atrial fibrillation: Secondary | ICD-10-CM | POA: Diagnosis not present

## 2015-09-24 LAB — POCT INR: INR: 2.7

## 2015-10-14 ENCOUNTER — Encounter: Payer: Self-pay | Admitting: Cardiology

## 2015-10-14 NOTE — Progress Notes (Signed)
Cardiology Office Note   Date:  10/15/2015   ID:  [name], DOB Feb 26, 1929, MRN [id number]  PCP:  Irven Shelling, MD    Chief Complaint  Patient presents with  . Mitral Valve Prolapse    no refills  . Atrial Fibrillation  . Congestive Heart Failure      History of Present Illness: [name] is a 81 y.o. male with a history of MV repair for MVP/MR and MAZE procedure, atrial fibrillation, OSA on CPAP and chronic LE edema with chronic diastolic CHF who presents today for followup. He is doing well. He denies any chest pain, SOB, DOE, palpitations or syncope.He has chronic LLE edema which actually has resolved. He is tolerating his CPAP well. He uses a nasal pillow mask which he tolerates and feels the pressure is fine.He feels rested in the am and has no daytime sleepiness.    Past Medical History  Diagnosis Date  . Insomnia   . BPH (benign prostatic hypertrophy)   . Constipation   . History of torn meniscus of left knee     PAINFUL LEFT KNEE  . Hearing loss     BILATERAL - WEARS HEARING AIDS  . Mitral valve prolapse   . Mitral valve regurgitation   . OSA on CPAP   . Erectile dysfunction   . Cancer ([place])     SKIN CANCERS  . Arthritis     HANDS  . Atrial fibrillation ([place]) 02/10/2013  . 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 AAA (abdominal aortic aneurysm) without rupture ([place]) 03/03/2013    Small, partially thrombosed saccular aneurysm  . S/P Maze operation for atrial fibrillation 03/15/2013    Complete bilateral lesion set using cryothermy and bipolar radiofrequency ablation with oversewing of LA appendage via right mini thoracotomy  . Chronic diastolic congestive heart failure ([place]) 03/20/2013  . Hyperlipidemia     statin intolerance    Past Surgical History  Procedure Laterality Date  . Skin cancers removed from left forehead and left nose    . Skin cancers removed from both legs    . Surgery left neck for  removal of metastatic lymph node ( from cancer left forehead)  and left neck dissection-rest of lymph nodes negative for any cancer    . Carpal tunnel release      BILATERAL  . Right rotator cuff repair    . Tee without cardioversion N/A 01/31/2013    Procedure: TRANSESOPHAGEAL ECHOCARDIOGRAM (TEE);  Surgeon: Sueanne Margarita, MD;  Location: [facility];  Service: Cardiovascular;  Laterality: N/A;  . Hemorrhoid surgery      1973  . Mitral valve repair Right 03/15/2013    Procedure: MINIMALLY INVASIVE MITRAL VALVE REPAIR (MVR);  Surgeon: Rexene Alberts, MD;  Location: [facility];  Service: Open Heart Surgery;  Laterality: Right;  . Minimally invasive maze procedure Right 03/15/2013    Procedure: MINIMALLY INVASIVE MAZE PROCEDURE;  Surgeon: Rexene Alberts, MD;  Location: [facility];  Service: Open Heart Surgery;  Laterality: Right;  (R) AXILLARY CANNULATION  . Intraoperative transesophageal echocardiogram Right 03/15/2013    Procedure: INTRAOPERATIVE TRANSESOPHAGEAL ECHOCARDIOGRAM;  Surgeon: Rexene Alberts, MD;  Location: [facility];  Service: Open Heart Surgery;  Laterality: Right;     Current Outpatient Prescriptions  Medication Sig Dispense Refill  . cetirizine (ZYRTEC) 10 MG tablet Take 10 mg by mouth daily.     Marland Kitchen  cholecalciferol (VITAMIN D) 1000 UNITS tablet Take 1,000 Units by mouth every morning.     . furosemide (LASIX) 20 MG tablet Take 1 tablet (20 mg total) by mouth every other day. 30 tablet 11  . Glucosamine-Chondroit-Vit C-Mn (GLUCOSAMINE CHONDR 500 COMPLEX) CAPS Take 2 capsules by mouth daily.     Marland Kitchen HYDROcodone-acetaminophen (NORCO/VICODIN) 5-325 MG per tablet Take 1 tablet by mouth every 6 (six) hours as needed for moderate pain. 30 tablet 0  . triamcinolone cream (KENALOG) 0.1 % Apply 1 application topically daily.    Marland Kitchen warfarin (COUMADIN) 5 MG tablet TAKE AS DIRECTED BY COUMADIN CLINIC 30 tablet 3  . colestipol (COLESTID) 1 G tablet Take 1 g by mouth 2 (two) times daily.     No current  facility-administered medications for this visit.    Allergies:   Statins and Latex    Social History:  The patient  reports that he has quit smoking. He started smoking about 54 years ago. He does not have any smokeless tobacco history on file. He reports that he drinks about 3.5 oz of alcohol per week. He reports that he does not use illicit drugs.   Family History:  The patient's family history includes Alzheimer's disease in his mother; Arthritis in his mother; Congestive Heart Failure in his mother; Coronary artery disease in his father; Diabetes in his mother; Heart attack in his father; Heart disease in his father.    ROS:  Please see the history of present illness.   Otherwise, review of systems are positive for none.   All other systems are reviewed and negative.    PHYSICAL EXAM: VS:  BP 114/70 mmHg  Pulse 68  Ht 5\' 10"  (1.778 m)  Wt 77.656 kg (171 lb 3.2 oz)  BMI 24.56 kg/m2 , BMI Body mass index is 24.56 kg/(m^2). GEN: Well nourished, well developed, in no acute distress HEENT: normal Neck: no JVD, carotid bruits, or masses Cardiac: RRR; no murmurs, rubs, or gallops,no edema  Respiratory:  clear to auscultation bilaterally, normal work of breathing GI: soft, nontender, nondistended, + BS MS: no deformity or atrophy Skin: warm and dry, no rash Neuro:  Strength and sensation are intact Psych: euthymic mood, full affect   EKG:  EKG was ordered today showing NSR with PACs and rSR' in V1    Recent Labs: 04/17/2015: BUN 24*; Creatinine, Ser 1.70*; Potassium 4.0; Sodium 138    Lipid Panel No results found for: CHOL, TRIG, HDL, CHOLHDL, VLDL, LDLCALC, LDLDIRECT    Wt Readings from Last 3 Encounters:  10/15/15 77.656 kg (171 lb 3.2 oz)  04/09/15 78.654 kg (173 lb 6.4 oz)  03/28/15 77.111 kg (170 lb)      ASSESSMENT AND PLAN:  1. MVP with severe MR s/p MV repair 2. Afib s/p MAZE - maintaining NSR - continue Warfarin 3. Chronic LE edema with  chronic diastolic CHF - continue Lasix/potassium - check BMET  4. OSA on CPAP - tolerating well. I will get a d/l from his DME   5.  Dyslipidemia - statin intolerant   6.  Small partially thrombosed infrarenal saccular aneurysm - repeat abd [place]    Current medicines are reviewed at length with the patient today.  The patient does not have concerns regarding medicines.  The following changes have been made:  no change  Labs/ tests ordered today: See above Assessment and Plan No orders of the defined types were placed in this encounter.     Disposition:   FU  with me in 1 year  Signed, Sueanne Margarita, MD  10/15/2015 10:19 AM    [address], [place], [place]  [address] Phone: [phone]; Fax: [phone]

## 2015-10-15 ENCOUNTER — Ambulatory Visit (INDEPENDENT_AMBULATORY_CARE_PROVIDER_SITE_OTHER): Payer: Medicare Other | Admitting: Cardiology

## 2015-10-15 ENCOUNTER — Encounter: Payer: Self-pay | Admitting: Cardiology

## 2015-10-15 VITALS — BP 114/70 | HR 68 | Ht 70.0 in | Wt 171.2 lb

## 2015-10-15 DIAGNOSIS — I5032 Chronic diastolic (congestive) heart failure: Secondary | ICD-10-CM | POA: Diagnosis not present

## 2015-10-15 DIAGNOSIS — I480 Paroxysmal atrial fibrillation: Secondary | ICD-10-CM

## 2015-10-15 DIAGNOSIS — G4733 Obstructive sleep apnea (adult) (pediatric): Secondary | ICD-10-CM

## 2015-10-15 DIAGNOSIS — I714 Abdominal aortic aneurysm, without rupture, unspecified: Secondary | ICD-10-CM

## 2015-10-15 DIAGNOSIS — I340 Nonrheumatic mitral (valve) insufficiency: Secondary | ICD-10-CM

## 2015-10-15 DIAGNOSIS — Z8679 Personal history of other diseases of the circulatory system: Secondary | ICD-10-CM

## 2015-10-15 DIAGNOSIS — Z9989 Dependence on other enabling machines and devices: Secondary | ICD-10-CM

## 2015-10-15 DIAGNOSIS — Z9889 Other specified postprocedural states: Secondary | ICD-10-CM

## 2015-10-15 LAB — BASIC METABOLIC PANEL
BUN: 25 mg/dL (ref 7–25)
CALCIUM: 9.2 mg/dL (ref 8.6–10.3)
CO2: 26 mmol/L (ref 20–31)
Chloride: 102 mmol/L (ref 98–110)
Creat: 1.71 mg/dL — ABNORMAL HIGH (ref 0.70–1.11)
GLUCOSE: 71 mg/dL (ref 65–99)
Potassium: 4.7 mmol/L (ref 3.5–5.3)
SODIUM: 137 mmol/L (ref 135–146)

## 2015-10-15 NOTE — Patient Instructions (Signed)
Medication Instructions:  Your physician recommends that you continue on your current medications as directed. Please refer to the Current Medication list given to you today.   Labwork: TODAY: BMET  Testing/Procedures: Dr. Radford Pax recommends you have an ABDOMINAL DUPLEX.  Follow-Up: Your physician wants you to follow-up in: 1 year with Dr. Radford Pax. You will receive a reminder letter in the mail two months in advance. If you don't receive a letter, please call our office to schedule the follow-up appointment.   Any Other Special Instructions Will Be Listed Below (If Applicable).     If you need a refill on your cardiac medications before your next appointment, please call your pharmacy.

## 2015-10-24 ENCOUNTER — Other Ambulatory Visit: Payer: Self-pay | Admitting: Internal Medicine

## 2015-10-24 ENCOUNTER — Ambulatory Visit
Admission: RE | Admit: 2015-10-24 | Discharge: 2015-10-24 | Disposition: A | Discharge status: Home / Self Care | Payer: Medicare Other | Source: Ambulatory Visit | Attending: Internal Medicine | Admitting: Internal Medicine

## 2015-10-24 DIAGNOSIS — M25562 Pain in left knee: Secondary | ICD-10-CM

## 2015-10-31 ENCOUNTER — Ambulatory Visit (HOSPITAL_COMMUNITY)
Admission: RE | Admit: 2015-10-31 | Discharge: 2015-10-31 | Disposition: A | Discharge status: Home / Self Care | Payer: Medicare Other | Source: Ambulatory Visit | Attending: Cardiology | Admitting: Cardiology

## 2015-10-31 DIAGNOSIS — I744 Embolism and thrombosis of arteries of extremities, unspecified: Secondary | ICD-10-CM | POA: Diagnosis not present

## 2015-10-31 DIAGNOSIS — I700 Atherosclerosis of aorta: Secondary | ICD-10-CM | POA: Diagnosis not present

## 2015-10-31 DIAGNOSIS — E785 Hyperlipidemia, unspecified: Secondary | ICD-10-CM | POA: Insufficient documentation

## 2015-10-31 DIAGNOSIS — I714 Abdominal aortic aneurysm, without rupture, unspecified: Secondary | ICD-10-CM

## 2015-11-05 ENCOUNTER — Telehealth: Payer: Self-pay

## 2015-11-05 ENCOUNTER — Ambulatory Visit (INDEPENDENT_AMBULATORY_CARE_PROVIDER_SITE_OTHER): Payer: Medicare Other | Admitting: *Deleted

## 2015-11-05 DIAGNOSIS — I714 Abdominal aortic aneurysm, without rupture, unspecified: Secondary | ICD-10-CM

## 2015-11-05 DIAGNOSIS — Z5181 Encounter for therapeutic drug level monitoring: Secondary | ICD-10-CM

## 2015-11-05 DIAGNOSIS — I4891 Unspecified atrial fibrillation: Secondary | ICD-10-CM

## 2015-11-05 DIAGNOSIS — Z7901 Long term (current) use of anticoagulants: Secondary | ICD-10-CM | POA: Diagnosis not present

## 2015-11-05 LAB — POCT INR: INR: 2.2

## 2015-11-05 NOTE — Telephone Encounter (Signed)
-----   Message from Sueanne Margarita, MD sent at 11/03/2015  9:22 PM EST ----- Increased size of infrarenal aneurysm that is partially thrombosed.  Please refer to vascular surgery and repeat study in 1 year

## 2015-11-05 NOTE — Telephone Encounter (Signed)
Informed patient of results and verbal understanding expressed.  Referral to VS placed. Repeat study ordered to be studied in one year. Patient agrees with treatment plan.

## 2015-11-15 ENCOUNTER — Other Ambulatory Visit: Payer: Self-pay | Admitting: Cardiology

## 2015-12-06 ENCOUNTER — Encounter: Payer: Self-pay | Admitting: Vascular Surgery

## 2015-12-06 ENCOUNTER — Telehealth: Payer: Self-pay | Admitting: Cardiology

## 2015-12-06 NOTE — Telephone Encounter (Signed)
New message      Daughter is calling to get information regarding patient's aneurysm.  He has an appt with the specialist soon and they want some understanding about it.

## 2015-12-06 NOTE — Telephone Encounter (Signed)
Spoke with daughter Adrian Prince that Dr Radford Pax was watching his abdominal aneurysm and there was a growth change. DR/nurse not in office.  I do not see any documentation as to exact number differences to inform her but did go over app date and time for app with Dr Donnetta Hutching. Daughter was dissatisfied with her fathers care but thankful for call back and current update.

## 2015-12-07 NOTE — Telephone Encounter (Signed)
Happy to see him.  Dr. Lemmie Evens

## 2015-12-08 NOTE — Telephone Encounter (Signed)
I am fine with him switching to Dr. Debara Pickett for his Cardiac issues but He does not follow sleep patients so will need to continue to follow with me for sleep or switch to Dr. Claiborne Billings in [place] office

## 2015-12-09 ENCOUNTER — Telehealth: Payer: Self-pay

## 2015-12-09 NOTE — Telephone Encounter (Addendum)
Received a message from triage on 12/06/15 stating patient would like to switch physicians from Dr. Radford Pax to Dr. Debara Pickett.  Dr. Radford Pax agreed to switch, however Dr Debara Pickett does not "manage sleep issues."  Dr Debara Pickett agreed to see patient. Call to patient's daughter "Lyn" who was unable to clearly verbalize whether or not her dad wanted to switch physicians.  Writer attempted to explain to patient's daughter that Dr Radford Pax was managing patient's OSA and CPAP.  Lyn seemed unaware of this. Lyn perceives a "lack of respect for her dad" due to long waiting room time, and Dr Radford Pax typing in the computer during most of her dad's visit. Writer apologized for any actions on our part contributing to this perception. Lyn will follow up with her dad to determine whether or not he actually wants to switch.  She will contact the office to let us know. Georgana Curio MHA RN CCM

## 2015-12-10 ENCOUNTER — Ambulatory Visit (INDEPENDENT_AMBULATORY_CARE_PROVIDER_SITE_OTHER): Payer: Medicare Other | Admitting: Vascular Surgery

## 2015-12-10 ENCOUNTER — Encounter: Payer: Self-pay | Admitting: Vascular Surgery

## 2015-12-10 VITALS — BP 140/86 | HR 72 | Temp 97.6°F | Resp 14 | Ht 70.0 in | Wt 176.0 lb

## 2015-12-10 DIAGNOSIS — I714 Abdominal aortic aneurysm, without rupture, unspecified: Secondary | ICD-10-CM

## 2015-12-10 DIAGNOSIS — R2243 Localized swelling, mass and lump, lower limb, bilateral: Secondary | ICD-10-CM | POA: Diagnosis not present

## 2015-12-10 NOTE — Progress Notes (Signed)
[facility]  Patient name: [name] MRN: [id number] DOB: 1929/03/10 Sex: male  REASON FOR CONSULT:  Abdominal aortic aneurysm  HPI: [name] is a 82 y.o. male, who is  Seen today or discussion of small abdominal aortic aneurysm. He had a recent ultrasound showing a maximal diameter of 3.5 cm infrarenal abdominal aortic aneurysm. He had a CT of his abdomen and 2014 showing a maximal diameter of 2.7 cm. He has no symptoms referable to this. He is here today with 2 daughters. He has no history of arterial insufficiency and no history of stroke. He has a extensive cardiac history as outlined below. He remains quite active at his age of 9.  Past Medical History  Diagnosis Date  . Insomnia   . BPH (benign prostatic hypertrophy)   . Constipation   . History of torn meniscus of left knee     PAINFUL LEFT KNEE  . Hearing loss     BILATERAL - WEARS HEARING AIDS  . Mitral valve prolapse   . Mitral valve regurgitation   . OSA on CPAP   . Erectile dysfunction   . Cancer ([place])     SKIN CANCERS  . Arthritis     HANDS  . Atrial fibrillation ([place]) 02/10/2013  . 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 AAA (abdominal aortic aneurysm) without rupture ([place]) 03/03/2013    Small, partially thrombosed saccular aneurysm  . S/P Maze operation for atrial fibrillation 03/15/2013    Complete bilateral lesion set using cryothermy and bipolar radiofrequency ablation with oversewing of LA appendage via right mini thoracotomy  . Chronic diastolic congestive heart failure ([place]) 03/20/2013  . Hyperlipidemia     statin intolerance    Family History  Problem Relation Age of Onset  . Coronary artery disease Father   . Heart disease Father   . Heart attack Father   . Diabetes Mother   . Arthritis Mother   . Congestive Heart Failure Mother   . Alzheimer's disease Mother     SOCIAL HISTORY: Social History   Social History  . Marital Status: Married   Spouse Name: N/A  . Number of Children: 2  . Years of Education: N/A   Occupational History  . retired Art gallery manager    Social History Main Topics  . Smoking status: Former Smoker -- 1.00 packs/day for 20 years    Start date: 11/23/1960  . Smokeless tobacco: Not on file     Comment: [place]  . Alcohol Use: 3.5 oz/week    7 drink(s) per week     Comment: COCKTAIL AND GLASS OF WINE WITH DINNER -DAILY  . Drug Use: No  . Sexual Activity: Not on file   Other Topics Concern  . Not on file   Social History Narrative   Wife has stage IV lung cancer      Patient has remained physically active and continues to drive, take care of yard work and chores, and enjoys playing golf    Allergies  Allergen Reactions  . Statins     Muscle pains  . Latex Rash    bandaids--no other latex problems    Current Outpatient Prescriptions  Medication Sig Dispense Refill  . cetirizine (ZYRTEC) 10 MG tablet Take 10 mg by mouth daily.     . cholecalciferol (VITAMIN D) 1000 UNITS tablet Take 1,000 Units by mouth every morning.     . colestipol (COLESTID) 1 G tablet  Take 1 g by mouth 2 (two) times daily.    . furosemide (LASIX) 20 MG tablet TAKE 1 TABLET BY MOUTH DAILY. 30 tablet 11  . Glucosamine-Chondroit-Vit C-Mn (GLUCOSAMINE CHONDR 500 COMPLEX) CAPS Take 2 capsules by mouth daily.     Marland Kitchen HYDROcodone-acetaminophen (NORCO/VICODIN) 5-325 MG per tablet Take 1 tablet by mouth every 6 (six) hours as needed for moderate pain. 30 tablet 0  . potassium chloride SA (K-DUR,KLOR-CON) 20 MEQ tablet Take 20 mEq by mouth 2 (two) times daily.    Marland Kitchen triamcinolone cream (KENALOG) 0.1 % Apply 1 application topically daily.    Marland Kitchen warfarin (COUMADIN) 5 MG tablet TAKE AS DIRECTED BY COUMADIN CLINIC 30 tablet 3   No current facility-administered medications for this visit.    REVIEW OF SYSTEMS:  [X]  denotes positive finding, [ ]  denotes negative finding Cardiac  Comments:  Chest pain or chest pressure:     Shortness of breath upon exertion:    Short of breath when lying flat:    Irregular heart rhythm:        Vascular    Pain in calf, thigh, or hip brought on by ambulation: x   Pain in feet at night that wakes you up from your sleep:     Blood clot in your veins:    Leg swelling:  x       Pulmonary    Oxygen at home:    Productive cough:     Wheezing:         Neurologic    Sudden weakness in arms or legs:     Sudden numbness in arms or legs:     Sudden onset of difficulty speaking or slurred speech:    Temporary loss of vision in one eye:     Problems with dizziness:         Gastrointestinal    Blood in stool:     Vomited blood:         Genitourinary    Burning when urinating:     Blood in urine:        Psychiatric    Major depression:         Hematologic    Bleeding problems:    Problems with blood clotting too easily:        Skin    Rashes or ulcers:        Constitutional    Fever or chills:      PHYSICAL EXAM: Filed Vitals:   12/10/15 1444  BP: 140/86  Pulse: 72  Temp: 97.6 F (36.4 C)  TempSrc: Oral  Resp: 14  Height: 5\' 10"  (1.778 m)  Weight: 176 lb (79.833 kg)  SpO2: 98%    GENERAL: The patient is a well-nourished male, in no acute distress. The vital signs are documented above. CARDIAC:  Irregular rate and rhythm  VASCULAR:  2+ radial 2+ dorsalis pedis pulses bilaterally. He does have prominent popliteal pulses bilaterally with no tenderness. PULMONARY: There is good air exchange bilaterally without wheezing or rales. ABDOMEN: Soft and non-tender with normal pitched bowel sounds.  I do not palpate an aneurysm MUSCULOSKELETAL: There are no major deformities or cyanosis. NEUROLOGIC: No focal weakness or paresthesias are detected. SKIN: There are no ulcers or rashes noted. PSYCHIATRIC: The patient has a normal affect.  DATA:   reviewed his since ultrasound showing 3.5 cm aneurysm  MEDICAL ISSUES:  had a very long discussion with patient and  his daughters present. Explained the significance of his  small aneurysm. Explained that would recommend annual yearly follow-up to rule out any progression in size. Explained that with his very small sizes extensively no risk for recurrent rupture. Also explained that his lifelong risk for expansion to the level of needing correction was small as well. I did review symptoms of aneurysm with him. He does have a prominent popliteal pulse patient as well. When he returns in one year for duplex of his aorta we will image his popliteal for documentation purposes. Explained that he does not need to have any limitations far as activity regarding his small aortic aneurysm. He was relieved with this discussion will see [place] again in one year   Verlinda Slotnick [facility] [employer]: [phone]

## 2015-12-11 NOTE — Addendum Note (Signed)
Addended by: Mena Goes on: 12/11/2015 04:39 PM   Modules accepted: Orders

## 2015-12-16 ENCOUNTER — Other Ambulatory Visit: Payer: Self-pay | Admitting: Orthopedic Surgery

## 2015-12-16 DIAGNOSIS — R609 Edema, unspecified: Secondary | ICD-10-CM

## 2015-12-16 DIAGNOSIS — R52 Pain, unspecified: Secondary | ICD-10-CM

## 2015-12-17 ENCOUNTER — Ambulatory Visit
Admission: RE | Admit: 2015-12-17 | Discharge: 2015-12-17 | Disposition: A | Discharge status: Home / Self Care | Payer: Medicare Other | Source: Ambulatory Visit | Attending: Orthopedic Surgery | Admitting: Orthopedic Surgery

## 2015-12-17 ENCOUNTER — Ambulatory Visit (INDEPENDENT_AMBULATORY_CARE_PROVIDER_SITE_OTHER): Payer: Medicare Other | Admitting: *Deleted

## 2015-12-17 DIAGNOSIS — Z7901 Long term (current) use of anticoagulants: Secondary | ICD-10-CM

## 2015-12-17 DIAGNOSIS — R52 Pain, unspecified: Secondary | ICD-10-CM

## 2015-12-17 DIAGNOSIS — R609 Edema, unspecified: Secondary | ICD-10-CM

## 2015-12-17 DIAGNOSIS — I4891 Unspecified atrial fibrillation: Secondary | ICD-10-CM

## 2015-12-17 DIAGNOSIS — Z5181 Encounter for therapeutic drug level monitoring: Secondary | ICD-10-CM | POA: Diagnosis not present

## 2015-12-17 LAB — POCT INR: INR: 1.8

## 2015-12-18 NOTE — Telephone Encounter (Signed)
Called DPR to discuss options for switching doctors so there is no delay in care.  She understands the patient will need a cardiologist and a doctor to manage sleep apnea.  She is visiting the patient tonight and will discuss. She will call tomorrow with a decision.  DPR grateful for call.

## 2015-12-19 NOTE — Telephone Encounter (Signed)
DPR returned call. She spoke with patient, and he wishes to switch to Dr. Claiborne Billings for his cardiology and CPAP needs instead of seeing two different doctors.   To Dr. Claiborne Billings and Dr. Radford Pax.

## 2015-12-22 NOTE — Telephone Encounter (Signed)
I am fine with that 

## 2015-12-25 NOTE — Telephone Encounter (Signed)
Printed and given to schedulers.

## 2015-12-25 NOTE — Telephone Encounter (Signed)
ok 

## 2015-12-31 ENCOUNTER — Ambulatory Visit (INDEPENDENT_AMBULATORY_CARE_PROVIDER_SITE_OTHER): Payer: Medicare Other

## 2015-12-31 DIAGNOSIS — Z5181 Encounter for therapeutic drug level monitoring: Secondary | ICD-10-CM | POA: Diagnosis not present

## 2015-12-31 DIAGNOSIS — I4891 Unspecified atrial fibrillation: Secondary | ICD-10-CM

## 2015-12-31 DIAGNOSIS — Z7901 Long term (current) use of anticoagulants: Secondary | ICD-10-CM | POA: Diagnosis not present

## 2015-12-31 LAB — POCT INR: INR: 2

## 2016-01-21 ENCOUNTER — Ambulatory Visit (INDEPENDENT_AMBULATORY_CARE_PROVIDER_SITE_OTHER): Payer: Medicare Other | Admitting: *Deleted

## 2016-01-21 DIAGNOSIS — Z5181 Encounter for therapeutic drug level monitoring: Secondary | ICD-10-CM | POA: Diagnosis not present

## 2016-01-21 DIAGNOSIS — Z7901 Long term (current) use of anticoagulants: Secondary | ICD-10-CM

## 2016-01-21 DIAGNOSIS — I4891 Unspecified atrial fibrillation: Secondary | ICD-10-CM

## 2016-01-21 LAB — POCT INR: INR: 2.3

## 2016-02-03 ENCOUNTER — Other Ambulatory Visit: Payer: Self-pay | Admitting: Cardiology

## 2016-02-18 ENCOUNTER — Ambulatory Visit (INDEPENDENT_AMBULATORY_CARE_PROVIDER_SITE_OTHER): Payer: Medicare Other | Admitting: *Deleted

## 2016-02-18 DIAGNOSIS — Z5181 Encounter for therapeutic drug level monitoring: Secondary | ICD-10-CM

## 2016-02-18 DIAGNOSIS — Z7901 Long term (current) use of anticoagulants: Secondary | ICD-10-CM | POA: Diagnosis not present

## 2016-02-18 DIAGNOSIS — I4891 Unspecified atrial fibrillation: Secondary | ICD-10-CM

## 2016-02-18 LAB — POCT INR: INR: 2.8

## 2016-03-18 ENCOUNTER — Encounter: Payer: Self-pay | Admitting: Internal Medicine

## 2016-03-18 DIAGNOSIS — N183 Chronic kidney disease, stage 3 unspecified: Secondary | ICD-10-CM | POA: Insufficient documentation

## 2016-03-19 ENCOUNTER — Encounter: Payer: Self-pay | Admitting: Internal Medicine

## 2016-03-19 ENCOUNTER — Non-Acute Institutional Stay: Payer: Medicare Other | Admitting: Internal Medicine

## 2016-03-19 VITALS — BP 125/70 | HR 72 | Temp 97.6°F | Resp 20

## 2016-03-19 DIAGNOSIS — I714 Abdominal aortic aneurysm, without rupture, unspecified: Secondary | ICD-10-CM

## 2016-03-19 DIAGNOSIS — I480 Paroxysmal atrial fibrillation: Secondary | ICD-10-CM

## 2016-03-19 DIAGNOSIS — I5032 Chronic diastolic (congestive) heart failure: Secondary | ICD-10-CM

## 2016-03-19 DIAGNOSIS — N183 Chronic kidney disease, stage 3 unspecified: Secondary | ICD-10-CM

## 2016-03-19 DIAGNOSIS — M170 Bilateral primary osteoarthritis of knee: Secondary | ICD-10-CM

## 2016-03-19 NOTE — Assessment & Plan Note (Signed)
Now seems to be in persistent atrial fib again Apparently cardiologist discussed repeat procedure--- he seems to be doing well with just rate control He will consider Is on the coumadin

## 2016-03-19 NOTE — Assessment & Plan Note (Signed)
Last creat 1.7 Will check again next month Not on ACEI--- will reconsider depending on stability

## 2016-03-19 NOTE — Assessment & Plan Note (Signed)
Currently doing okay without meds Will start tylenol if increased symptoms

## 2016-03-19 NOTE — Assessment & Plan Note (Signed)
Well compensated now with every other day furosemide Actually feels edema better with increased walking here

## 2016-03-19 NOTE — Assessment & Plan Note (Signed)
Will require regular monitoring Reviewed Dr Evelena Leyden note

## 2016-03-19 NOTE — Progress Notes (Signed)
Subjective:    Patient ID: [name], male    DOB: [birth year], 82 y.o.   MRN: [id number]  HPI Visit to establish care here Moved here from private residence in [place] status with Austin Ro RN  Has had atrial fibrillation for many year Had MAZE procedure--still has paroxysms but "not often" No palpitations No chest pain No SOB No edema since here Does try to walk and goes to fitness center  Known AAA Recent evaluation with [name] No action needed  Past MR Reports procedure in past to treat this  No pain issues now in back Chronic pain in knees  Chronic sleep issues Still getting used to the bed here, etc---but sleeping okay Sleeps with CPAP and finds this effective  Current Outpatient Prescriptions on File Prior to Visit  Medication Sig Dispense Refill  . cetirizine (ZYRTEC) 10 MG tablet Take 10 mg by mouth daily.     . colestipol (COLESTID) 1 G tablet Take 2 g by mouth daily.     . furosemide (LASIX) 20 MG tablet TAKE 1 TABLET BY MOUTH DAILY. (Patient taking differently: TAKE 1 TABLET BY MOUTH EVERY OTHER DAY) 30 tablet 11  . Glucosamine-Chondroit-Vit C-Mn (GLUCOSAMINE CHONDR 500 COMPLEX) CAPS Take 2 capsules by mouth daily.     Marland Kitchen warfarin (COUMADIN) 5 MG tablet TAKE AS DIRECTED BY COUMADIN CLINIC 30 tablet 3  . cholecalciferol (VITAMIN D) 1000 UNITS tablet Take 1,000 Units by mouth every morning. Reported on 03/19/2016     No current facility-administered medications on file prior to visit.    Allergies  Allergen Reactions  . Statins     Muscle pains  . Latex Rash    bandaids--no other latex problems    Past Medical History  Diagnosis Date  . Insomnia   . BPH (benign prostatic hypertrophy)   . Constipation   . History of torn meniscus of left knee     PAINFUL LEFT KNEE  . Hearing loss     BILATERAL - WEARS HEARING AIDS  . Mitral valve prolapse   . Mitral valve regurgitation   . OSA on CPAP   . Erectile dysfunction   . Cancer ([name])       SKIN CANCERS  . Arthritis     HANDS  . Atrial fibrillation ([place]) 02/10/2013  . 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 AAA (abdominal aortic aneurysm) without rupture ([place]) 03/03/2013    Small, partially thrombosed saccular aneurysm  . S/P Maze operation for atrial fibrillation 03/15/2013    Complete bilateral lesion set using cryothermy and bipolar radiofrequency ablation with oversewing of LA appendage via right mini thoracotomy  . Chronic diastolic congestive heart failure ([place]) 03/20/2013  . Hyperlipidemia     statin intolerance  . Chronic kidney disease, stage III (moderate)     Past Surgical History  Procedure Laterality Date  . Skin cancers removed from left forehead and left nose    . Skin cancers removed from both legs    . Surgery left neck for removal of metastatic lymph node ( from cancer left forehead)  and left neck dissection-rest of lymph nodes negative for any cancer    . Carpal tunnel release      BILATERAL  . Right rotator cuff repair    . Tee without cardioversion N/A 01/31/2013    Procedure: TRANSESOPHAGEAL ECHOCARDIOGRAM (TEE);  Surgeon: Austin Margarita, MD;  Location: [facility] ENDOSCOPY;  Service: Cardiovascular;  Laterality: N/A;  . Hemorrhoid surgery  1973  . Mitral valve repair Right 03/15/2013    Procedure: MINIMALLY INVASIVE MITRAL VALVE REPAIR (MVR);  Surgeon: Austin Alberts, MD;  Location: [place];  Service: Open Heart Surgery;  Laterality: Right;  . Minimally invasive maze procedure Right 03/15/2013    Procedure: MINIMALLY INVASIVE MAZE PROCEDURE;  Surgeon: Austin Alberts, MD;  Location: [place];  Service: Open Heart Surgery;  Laterality: Right;  (R) AXILLARY CANNULATION  . Intraoperative transesophageal echocardiogram Right 03/15/2013    Procedure: INTRAOPERATIVE TRANSESOPHAGEAL ECHOCARDIOGRAM;  Surgeon: Austin Alberts, MD;  Location: [place];  Service: Open Heart Surgery;  Laterality: Right;    Family History  Problem Relation Age of Onset  .  Coronary artery disease Father   . Heart disease Father   . Heart attack Father   . Diabetes Mother   . Arthritis Mother   . Congestive Heart Failure Mother   . Alzheimer's disease Mother   . Alzheimer's disease Sister     Social History   Social History  . Marital Status: Widowed    Spouse Name: N/A  . Number of Children: 2  . Years of Education: N/A   Occupational History  . retired [name]    Social History Main Topics  . Smoking status: Former Smoker -- 1.00 packs/day for 20 years    Start date: 11/23/1960  . Smokeless tobacco: Not on file     Comment: [place]  . Alcohol Use: 4.2 oz/week    7 Standard drinks or equivalent per week     Comment: Cocktail or glass of wine with dinner  . Drug Use: No  . Sexual Activity: Not on file   Other Topics Concern  . Not on file   Social History Narrative   Has living will   Daughter [name] is health care POA   Would accept resuscitation attempts but no prolonged ventilation   Not sure about tube feeds   Review of Systems  Constitutional: Negative for fatigue and unexpected weight change.       Appetite is good  HENT: Positive for hearing loss. Negative for dental problem and tinnitus.        Has hearing aides but they aren't helping. Keeps up with dentist  Eyes: Negative for visual disturbance.       No diplopia or unilateral vision loss  Respiratory: Negative for cough, chest tightness and shortness of breath.   Cardiovascular: Negative for chest pain, palpitations and leg swelling.  Gastrointestinal: Negative for nausea, vomiting, abdominal pain, constipation and blood in stool.       No heartburn  Endocrine: Negative for polydipsia and polyuria.  Genitourinary: Positive for frequency. Negative for urgency and difficulty urinating.       Nocturia x 1   Musculoskeletal: Positive for back pain and arthralgias. Negative for joint swelling.  Skin: Positive for rash.       Sees derm  regularly---occasionally has lesions to be treated Rash on chest--gets cream  Allergic/Immunologic: Positive for environmental allergies. Negative for immunocompromised state.  Neurological: Negative for dizziness, syncope, weakness, light-headedness and headaches.  Hematological: Negative for adenopathy. Bruises/bleeds easily.  Psychiatric/Behavioral: Negative for sleep disturbance and dysphoric mood. The patient is not nervous/anxious.        Objective:   Physical Exam  Constitutional: He appears well-developed and well-nourished. No distress.  Neck: Normal range of motion. Neck supple. No thyromegaly present.  Cardiovascular: Normal rate and normal heart sounds.  Exam reveals no gallop.   No murmur  heard. irregular  Pulmonary/Chest: Effort normal and breath sounds normal. No respiratory distress. He has no wheezes. He has no rales.  Abdominal: Soft. There is no tenderness.  Musculoskeletal: He exhibits no edema or tenderness.  Lymphadenopathy:    He has no cervical adenopathy.  Psychiatric: He has a normal mood and affect. His behavior is normal.          Assessment & Plan:

## 2016-04-16 LAB — POCT INR: INR: 2.4

## 2016-04-17 ENCOUNTER — Ambulatory Visit (INDEPENDENT_AMBULATORY_CARE_PROVIDER_SITE_OTHER): Payer: Medicare Other | Admitting: *Deleted

## 2016-04-17 DIAGNOSIS — I480 Paroxysmal atrial fibrillation: Secondary | ICD-10-CM

## 2016-04-17 DIAGNOSIS — Z5181 Encounter for therapeutic drug level monitoring: Secondary | ICD-10-CM

## 2016-04-17 NOTE — Progress Notes (Signed)
Pre visit review using our clinic review tool, if applicable. No additional management support is needed unless otherwise documented below in the visit note. 

## 2016-04-27 ENCOUNTER — Encounter: Payer: Self-pay | Admitting: Internal Medicine

## 2016-06-01 ENCOUNTER — Ambulatory Visit (INDEPENDENT_AMBULATORY_CARE_PROVIDER_SITE_OTHER): Payer: Medicare Other | Admitting: Family Medicine

## 2016-06-01 ENCOUNTER — Ambulatory Visit (INDEPENDENT_AMBULATORY_CARE_PROVIDER_SITE_OTHER)
Admission: RE | Admit: 2016-06-01 | Discharge: 2016-06-01 | Disposition: A | Discharge status: Home / Self Care | Payer: Medicare Other | Source: Ambulatory Visit | Attending: Family Medicine | Admitting: Family Medicine

## 2016-06-01 ENCOUNTER — Encounter: Payer: Self-pay | Admitting: Family Medicine

## 2016-06-01 VITALS — BP 120/68 | HR 69 | Temp 97.6°F | Ht 70.0 in | Wt 167.0 lb

## 2016-06-01 DIAGNOSIS — M545 Low back pain, unspecified: Secondary | ICD-10-CM

## 2016-06-01 DIAGNOSIS — M25552 Pain in left hip: Secondary | ICD-10-CM | POA: Diagnosis not present

## 2016-06-01 NOTE — Progress Notes (Signed)
Dr. Frederico Hamman T. Marquice Uddin, MD, [facility] [place], [address] Phone: [phone] Fax: [phone]  06/01/2016  [name], MRN: [id number], DOB: [birth year], 82 y.o.  Primary Physician:  Viviana Simpler, MD   Chief Complaint  Patient presents with  . Hip Pain    Left   Subjective:   [name] is a 82 y.o. very pleasant male patient who presents with the following: Back Pain  ongoing for approximately: 2-3 months ago The patient has had back pain before. The back pain is localized into the lumbar spine area. They also describe no radiculopathy.  Worried about his "left hip" Pain in his LEFT hip / back, started about 3 months ago and catching with putting on his socks and will get some catching in his socks and shoes. Recently, while at [facility], and while starting with bending over and got some pain on the side laterally.   Got worried that it was his hip and sore in the L lower back.   No numbness or tingling. No bowel or bladder incontinence. No focal weakness. Prior interventions: none Physical therapy: No Chiropractic manipulations: No Acupuncture: No Osteopathic manipulation: No Heat or cold: Minimal effect  Past Medical History, Surgical History, Family History, Medications, Allergies have been reviewed and updated if relevant.  Patient Active Problem List   Diagnosis Date Noted  . Osteoarthritis of both knees 03/19/2016  . Chronic kidney disease, stage III (moderate)   . S/P MVR (mitral valve repair) 03/28/2015  . Encounter for therapeutic drug monitoring 12/26/2013  . OSA on CPAP   . Chronic diastolic congestive heart failure ([place]) 03/20/2013  . S/P Maze operation for atrial fibrillation 03/15/2013  . MR (mitral regurgitation) 03/03/2013  . PAF (paroxysmal atrial fibrillation) ([place]) 03/03/2013  . Spinal stenosis, thoracic 03/03/2013  . AAA (abdominal aortic aneurysm) without  rupture ([place]) 03/03/2013  . Mitral valve prolapse, nonrheumatic 01/24/2013    Past Medical History  Diagnosis Date  . Insomnia   . BPH (benign prostatic hypertrophy)   . Constipation   . History of torn meniscus of left knee     PAINFUL LEFT KNEE  . Hearing loss     BILATERAL - WEARS HEARING AIDS  . Mitral valve prolapse   . Mitral valve regurgitation   . OSA on CPAP   . Erectile dysfunction   . Cancer ([place])     SKIN CANCERS  . Arthritis     HANDS  . Atrial fibrillation ([place]) 02/10/2013  . 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 AAA (abdominal aortic aneurysm) without rupture ([place]) 03/03/2013    Small, partially thrombosed saccular aneurysm  . S/P Maze operation for atrial fibrillation 03/15/2013    Complete bilateral lesion set using cryothermy and bipolar radiofrequency ablation with oversewing of LA appendage via right mini thoracotomy  . Chronic diastolic congestive heart failure ([place]) 03/20/2013  . Hyperlipidemia     statin intolerance  . Chronic kidney disease, stage III (moderate)     Past Surgical History  Procedure Laterality Date  . Skin cancers removed from left forehead and left nose    . Skin cancers removed from both legs    . Surgery left neck for removal of metastatic lymph node ( from cancer left forehead)  and left neck dissection-rest of lymph nodes negative for any cancer    . Carpal tunnel release      BILATERAL  .  Right rotator cuff repair    . Tee without cardioversion N/A 01/31/2013    Procedure: TRANSESOPHAGEAL ECHOCARDIOGRAM (TEE);  Surgeon: Sueanne Margarita, MD;  Location: [place];  Service: Cardiovascular;  Laterality: N/A;  . Hemorrhoid surgery      1973  . Mitral valve repair Right 03/15/2013    Procedure: MINIMALLY INVASIVE MITRAL VALVE REPAIR (MVR);  Surgeon: Rexene Alberts, MD;  Location: [place];  Service: Open Heart Surgery;  Laterality: Right;  . Minimally invasive maze procedure Right 03/15/2013    Procedure: MINIMALLY INVASIVE  MAZE PROCEDURE;  Surgeon: Rexene Alberts, MD;  Location: [facility];  Service: Open Heart Surgery;  Laterality: Right;  (R) AXILLARY CANNULATION  . Intraoperative transesophageal echocardiogram Right 03/15/2013    Procedure: INTRAOPERATIVE TRANSESOPHAGEAL ECHOCARDIOGRAM;  Surgeon: Rexene Alberts, MD;  Location: [place];  Service: Open Heart Surgery;  Laterality: Right;    Social History   Social History  . Marital Status: Widowed    Spouse Name: N/A  . Number of Children: 2  . Years of Education: N/A   Occupational History  . retired Art gallery manager    Social History Main Topics  . Smoking status: Former Smoker -- 1.00 packs/day for 20 years    Start date: 11/23/1960  . Smokeless tobacco: Never Used     Comment: [place]  . Alcohol Use: 4.2 oz/week    7 Standard drinks or equivalent per week     Comment: Cocktail or glass of wine with dinner  . Drug Use: No  . Sexual Activity: Not on file   Other Topics Concern  . Not on file   Social History Narrative   Has living will   Daughter [name] is health care POA   Would accept resuscitation attempts but no prolonged ventilation   Not sure about tube feeds    Family History  Problem Relation Age of Onset  . Coronary artery disease Father   . Heart disease Father   . Heart attack Father   . Diabetes Mother   . Arthritis Mother   . Congestive Heart Failure Mother   . Alzheimer's disease Mother   . Alzheimer's disease Sister     Allergies  Allergen Reactions  . Statins     Muscle pains  . Latex Rash    bandaids--no other latex problems    Medication list reviewed and updated in full in [place].  GEN: No fevers, chills. Nontoxic. Primarily MSK c/o today. MSK: Detailed in the HPI GI: tolerating PO intake without difficulty Neuro: As above  Otherwise the pertinent positives of the ROS are noted above.    Objective:   Blood pressure 120/68, pulse 69, temperature 97.6 F (36.4 C), temperature source  Oral, height _0  (1.778 m), weight 167 lb (75.751 kg).  Gen: Well-developed,well-nourished,in no acute distress; alert,appropriate and cooperative throughout examination HEENT: Normocephalic and atraumatic without obvious abnormalities.  Ears, externally no deformities Pulm: Breathing comfortably in no respiratory distress Range of motion at  the waist: Flexion, rotation and lateral bending: 4 flexion relatively normal, almost to toes.  Normal extension, lateral bending and rotation.  HIP EXAM: SIDE: left ROM: Abduction, Flexion, Internal and External range of motion: full Pain with terminal IROM and EROM: no GTB: NT SLR: NEG Knees: No effusion FABER: NT REVERSE FABER: NT, neg Piriformis: NT at direct palpation Str: flexion: 5/5 abduction: 5/5 adduction: 5/5 Strength testing non-tender  No echymosis or edema Rises to examination table with no difficulty  Gait: minimally antalgic  Inspection/Deformity: No abnormality Paraspinus T:  Mild tenderness L3-S1 bilaterally  B Ankle Dorsiflexion (L5,4): 5/5 B Great Toe Dorsiflexion (L5,4): 5/5 Heel Walk (L5): WNL Toe Walk (S1): WNL Rise/Squat (L4): WNL, mild pain  SENSORY B Medial Foot (L4): WNL B Dorsum (L5): WNL B Lateral (S1): WNL Light Touch: WNL Pinprick: WNL  REFLEXES Knee (L4): 2+ Ankle (S1): 2+  B SLR, seated: neg B SLR, supine: neg B FABER: neg B Reverse FABER: neg B Greater Troch: NT B Log Roll: neg B Stork: NT B Sciatic Notch: NT  Radiology: Dg Lumbar Spine Complete  06/02/2016  CLINICAL DATA:  Left hip and back pain. EXAM: LUMBAR SPINE - COMPLETE 4+ VIEW COMPARISON:  CT 08/08/2013 FINDINGS: Mild degenerative spurring throughout the lumbar spine. Degenerative disc disease with disc space narrowing, vacuum disc and endplate sclerosis at Z6-1. Degenerative facet disease in the mid and lower lumbar spine. SI joints are symmetric and unremarkable. No fracture. Calcifications in the aorta and iliac vessels. No  visible aneurysm. IMPRESSION: Spondylosis.  No acute bony abnormality. Aortic atherosclerosis. Electronically Signed   By: Rolm Baptise M.D.   On: 06/02/2016 08:40   Dg Hip Unilat With Pelvis 2-3 Views Left  06/02/2016  CLINICAL DATA:  Left hip and back pain EXAM: DG HIP (WITH OR WITHOUT PELVIS) 2-3V LEFT COMPARISON:  None. FINDINGS: Mild symmetric degenerative changes in the hips bilaterally with joint space narrowing and early spurring. SI joints are symmetric and unremarkable. No acute bony abnormality. Specifically, no fracture, subluxation, or dislocation. Soft tissues are intact. IMPRESSION: No acute bony abnormality. Electronically Signed   By: Rolm Baptise M.D.   On: 06/02/2016 08:38   The radiological images were independently reviewed by myself in the office and results were reviewed with the patient. My independent interpretation of images:  lleft hip films are grossly unremarkable for age.  The patient has got some minor  To mild joint space narrowing,, but they're grossly unremarkable otherwise. Electronically Signed  By: Owens Loffler, MD On: 06/02/2016 9:07 AM   The radiological images were independently reviewed by myself in the office and results were reviewed with the patient. My independent interpretation of images:  Lumbar spine films are significant  For a dramatic amount of stool in the entire intra-abdominal region with evidence of  Significant constipation.  There is also  Relatively diffuse degenerative disc disease in the lumbar region as well as in the lower thoracic spine region.  Agree that there is also some facet arthropathy as well. Electronically Signed  By: Owens Loffler, MD On: 06/02/2016 9:07 AM   Assessment and Plan:   Left hip pain - Plan: DG HIP UNILAT WITH PELVIS 2-3 VIEWS LEFT  Left-sided low back pain without sciatica - Plan: DG Lumbar Spine Complete  I reassured him that this is not coming from his intra-articular hip.   This is not a hip problem.  He is  having some referred pain from his back probably.  He has multiple medical comorbidities including congestive heart failure, chronic Coumadin use ffor A. Fib, chronic kidney disease, as well as a AAA.  I would proceed  Quite cautiously and conservatively here.   I would continue with Tylenol alone and I recommended that he  Work out at the fitness center  3 or 4 times a week.  I also recommended that he try some of the yoga, tai chi, or Pilates classes.  I wonder if some of the stool could be playing a  role, so I recommended a basic regimen of adding some Colace  And slightly increasing his MiraLAX  To help clean out over the next few days.  Follow-up: prn  Orders Placed This Encounter  Procedures  . DG Lumbar Spine Complete  . DG HIP UNILAT WITH PELVIS 2-3 VIEWS LEFT    Signed,  Emeri Estill T. Nashawn Hillock, MD   Patient's Medications  New Prescriptions   No medications on file  Previous Medications   CETIRIZINE (ZYRTEC) 10 MG TABLET    Take 10 mg by mouth daily.    CHOLECALCIFEROL (VITAMIN D) 1000 UNITS TABLET    Take 1,000 Units by mouth every morning. Reported on 03/19/2016   COLESTIPOL (COLESTID) 1 G TABLET    Take 2 g by mouth daily.    CYANOCOBALAMIN (,VITAMIN B-12,) 1000 MCG/ML INJECTION    Inject 1,000 mcg into the muscle every 30 (thirty) days.   FUROSEMIDE (LASIX) 20 MG TABLET    TAKE 1 TABLET BY MOUTH DAILY.   GLUCOSAMINE-CHONDROIT-VIT C-MN (GLUCOSAMINE CHONDR 500 COMPLEX) CAPS    Take 2 capsules by mouth daily.    POLYETHYLENE GLYCOL (MIRALAX / GLYCOLAX) PACKET    Take 17 g by mouth daily.   SODIUM CHLORIDE (OCEAN) 0.65 % SOLN NASAL SPRAY    Place 1 spray into both nostrils as needed for congestion.   WARFARIN (COUMADIN) 5 MG TABLET    TAKE AS DIRECTED BY COUMADIN CLINIC  Modified Medications   No medications on file  Discontinued Medications   No medications on file

## 2016-06-01 NOTE — Patient Instructions (Signed)
Docusate sodium capsules, 1 in the morning and 1 at night.   If not going more after one week, then   Increase Miralax dosing to twice a day for a few days until you have more bowel movements, and then back down to once a day again.

## 2016-06-01 NOTE — Progress Notes (Signed)
Pre visit review using our clinic review tool, if applicable. No additional management support is needed unless otherwise documented below in the visit note. 

## 2016-06-11 ENCOUNTER — Encounter: Payer: Self-pay | Admitting: Internal Medicine

## 2016-06-11 ENCOUNTER — Non-Acute Institutional Stay: Payer: Medicare Other | Admitting: Internal Medicine

## 2016-06-11 VITALS — BP 118/70 | HR 68 | Temp 98.8°F | Resp 16 | Wt 167.0 lb

## 2016-06-11 DIAGNOSIS — N183 Chronic kidney disease, stage 3 unspecified: Secondary | ICD-10-CM

## 2016-06-11 DIAGNOSIS — I5032 Chronic diastolic (congestive) heart failure: Secondary | ICD-10-CM | POA: Diagnosis not present

## 2016-06-11 DIAGNOSIS — K5909 Other constipation: Secondary | ICD-10-CM | POA: Diagnosis not present

## 2016-06-11 DIAGNOSIS — I480 Paroxysmal atrial fibrillation: Secondary | ICD-10-CM | POA: Diagnosis not present

## 2016-06-11 DIAGNOSIS — K5900 Constipation, unspecified: Secondary | ICD-10-CM | POA: Insufficient documentation

## 2016-06-11 DIAGNOSIS — Z9889 Other specified postprocedural states: Secondary | ICD-10-CM

## 2016-06-11 NOTE — Assessment & Plan Note (Signed)
Rate is controlled Continues on the coumadin

## 2016-06-11 NOTE — Assessment & Plan Note (Signed)
Causing problems with left sided pain Better with bid miralax Will stop the colestipol--unlikely to be doing much for his cholesterol

## 2016-06-11 NOTE — Assessment & Plan Note (Signed)
Compensated No changes needed Stable exercise tolerance

## 2016-06-11 NOTE — Progress Notes (Signed)
Subjective:    Patient ID: [name], male    DOB: 13-Jun-1929, 82 y.o.   MRN: [id number]  HPI Visit for review of chronic health conditions Reviewed status with Leafy Ro RN Some concern about alcohol intake---no clear signs of a problem though  He feels he is adjusting to living here No sig depression Not really anxious  Some problems with left hip---affecting his golf game. Getting evaluation by Dr Lorelei Pont Some concern for constipation--x-ray showed stool buildup. On miralax--discussed the colestid. Going better now with this. Less hip soreness  No chest pain No palpitations Breathing is okay No edema No dizziness or syncope  Appetite is good Occasionally misses evening meal  Current Outpatient Prescriptions on File Prior to Visit  Medication Sig Dispense Refill  . cetirizine (ZYRTEC) 10 MG tablet Take 10 mg by mouth daily.     . cholecalciferol (VITAMIN D) 1000 UNITS tablet Take 1,000 Units by mouth every morning. Reported on 03/19/2016    . colestipol (COLESTID) 1 G tablet Take 2 g by mouth daily.     . cyanocobalamin (,VITAMIN B-12,) 1000 MCG/ML injection Inject 1,000 mcg into the muscle every 30 (thirty) days.    . furosemide (LASIX) 20 MG tablet TAKE 1 TABLET BY MOUTH DAILY. (Patient taking differently: TAKE 1 TABLET BY MOUTH EVERY OTHER DAY) 30 tablet 11  . Glucosamine-Chondroit-Vit C-Mn (GLUCOSAMINE CHONDR 500 COMPLEX) CAPS Take 2 capsules by mouth daily.     . polyethylene glycol (MIRALAX / GLYCOLAX) packet Take 17 g by mouth daily.    . sodium chloride (OCEAN) 0.65 % SOLN nasal spray Place 1 spray into both nostrils as needed for congestion.    Marland Kitchen warfarin (COUMADIN) 5 MG tablet TAKE AS DIRECTED BY COUMADIN CLINIC 30 tablet 3   No current facility-administered medications on file prior to visit.    Allergies  Allergen Reactions  . Statins     Muscle pains  . Latex Rash    bandaids--no other latex problems    Past Medical History  Diagnosis Date  .  Insomnia   . BPH (benign prostatic hypertrophy)   . Constipation   . History of torn meniscus of left knee     PAINFUL LEFT KNEE  . Hearing loss     BILATERAL - WEARS HEARING AIDS  . Mitral valve prolapse   . Mitral valve regurgitation   . OSA on CPAP   . Erectile dysfunction   . Cancer ([place])     SKIN CANCERS  . Arthritis     HANDS  . Atrial fibrillation ([place]) 02/10/2013  . 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 AAA (abdominal aortic aneurysm) without rupture ([place]) 03/03/2013    Small, partially thrombosed saccular aneurysm  . S/P Maze operation for atrial fibrillation 03/15/2013    Complete bilateral lesion set using cryothermy and bipolar radiofrequency ablation with oversewing of LA appendage via right mini thoracotomy  . Chronic diastolic congestive heart failure ([place]) 03/20/2013  . Hyperlipidemia     statin intolerance  . Chronic kidney disease, stage III (moderate)     Past Surgical History  Procedure Laterality Date  . Skin cancers removed from left forehead and left nose    . Skin cancers removed from both legs    . Surgery left neck for removal of metastatic lymph node ( from cancer left forehead)  and left neck dissection-rest of lymph nodes negative for any cancer    . Carpal tunnel release  BILATERAL  . Right rotator cuff repair    . Tee without cardioversion N/A 01/31/2013    Procedure: TRANSESOPHAGEAL ECHOCARDIOGRAM (TEE);  Surgeon: Sueanne Margarita, MD;  Location: [facility];  Service: Cardiovascular;  Laterality: N/A;  . Hemorrhoid surgery      1973  . Mitral valve repair Right 03/15/2013    Procedure: MINIMALLY INVASIVE MITRAL VALVE REPAIR (MVR);  Surgeon: Rexene Alberts, MD;  Location: [facility];  Service: Open Heart Surgery;  Laterality: Right;  . Minimally invasive maze procedure Right 03/15/2013    Procedure: MINIMALLY INVASIVE MAZE PROCEDURE;  Surgeon: Rexene Alberts, MD;  Location: [facility];  Service: Open Heart Surgery;  Laterality: Right;  (R)  AXILLARY CANNULATION  . Intraoperative transesophageal echocardiogram Right 03/15/2013    Procedure: INTRAOPERATIVE TRANSESOPHAGEAL ECHOCARDIOGRAM;  Surgeon: Rexene Alberts, MD;  Location: [facility];  Service: Open Heart Surgery;  Laterality: Right;    Family History  Problem Relation Age of Onset  . Coronary artery disease Father   . Heart disease Father   . Heart attack Father   . Diabetes Mother   . Arthritis Mother   . Congestive Heart Failure Mother   . Alzheimer's disease Mother   . Alzheimer's disease Sister     Social History   Social History  . Marital Status: Widowed    Spouse Name: N/A  . Number of Children: 2  . Years of Education: N/A   Occupational History  . retired Art gallery manager    Social History Main Topics  . Smoking status: Former Smoker -- 1.00 packs/day for 20 years    Start date: 11/23/1960  . Smokeless tobacco: Never Used     Comment: [place]  . Alcohol Use: 4.2 oz/week    7 Standard drinks or equivalent per week     Comment: Cocktail or glass of wine with dinner  . Drug Use: No  . Sexual Activity: Not on file   Other Topics Concern  . Not on file   Social History Narrative   Has living will   Daughter [name] is health care POA   Would accept resuscitation attempts but no prolonged ventilation   Not sure about tube feeds   Review of Systems  No easy bruising or other bleeding with the warfarin (just mild) Still goes to the fitness center and tries to walk Sleeps well Voids okay. Flow is acceptable. Nocturia 1-2 per night.    Objective:   Physical Exam  Constitutional: He appears well-developed and well-nourished. No distress.  Neck: Normal range of motion. Neck supple. No thyromegaly present.  Cardiovascular: Normal rate and normal heart sounds.  Exam reveals no gallop.   No murmur heard. Irregular   Pulmonary/Chest: Effort normal and breath sounds normal. No respiratory distress. He has no wheezes. He has no rales.    Abdominal: Soft. There is no tenderness.  Musculoskeletal: He exhibits no edema or tenderness.  Lymphadenopathy:    He has no cervical adenopathy.  Psychiatric: He has a normal mood and affect. His behavior is normal.          Assessment & Plan:

## 2016-06-11 NOTE — Assessment & Plan Note (Signed)
Will monitor yearly labs

## 2016-06-11 NOTE — Assessment & Plan Note (Signed)
No sig murmur.  

## 2016-07-09 ENCOUNTER — Encounter: Payer: Self-pay | Admitting: Internal Medicine

## 2016-07-09 LAB — PROTIME-INR

## 2016-08-06 ENCOUNTER — Encounter: Payer: Self-pay | Admitting: Internal Medicine

## 2016-08-06 LAB — PROTIME-INR: INR: 2.3 — AB (ref 0.9–1.1)

## 2016-09-03 ENCOUNTER — Encounter: Payer: Self-pay | Admitting: Internal Medicine

## 2016-09-03 LAB — PROTIME-INR: INR: 2.4 — AB (ref 0.9–1.1)

## 2016-09-10 ENCOUNTER — Non-Acute Institutional Stay: Payer: Medicare Other | Admitting: Internal Medicine

## 2016-09-10 ENCOUNTER — Encounter: Payer: Self-pay | Admitting: Internal Medicine

## 2016-09-10 VITALS — BP 150/80 | HR 66 | Wt 169.8 lb

## 2016-09-10 DIAGNOSIS — I714 Abdominal aortic aneurysm, without rupture, unspecified: Secondary | ICD-10-CM

## 2016-09-10 DIAGNOSIS — K5900 Constipation, unspecified: Secondary | ICD-10-CM

## 2016-09-10 DIAGNOSIS — R29898 Other symptoms and signs involving the musculoskeletal system: Secondary | ICD-10-CM | POA: Diagnosis not present

## 2016-09-10 DIAGNOSIS — M150 Primary generalized (osteo)arthritis: Secondary | ICD-10-CM

## 2016-09-10 DIAGNOSIS — M159 Polyosteoarthritis, unspecified: Secondary | ICD-10-CM | POA: Insufficient documentation

## 2016-09-10 DIAGNOSIS — I480 Paroxysmal atrial fibrillation: Secondary | ICD-10-CM

## 2016-09-10 DIAGNOSIS — I5032 Chronic diastolic (congestive) heart failure: Secondary | ICD-10-CM | POA: Diagnosis not present

## 2016-09-10 MED ORDER — SENNOSIDES-DOCUSATE SODIUM 8.6-50 MG PO TABS: 2.0000 | ORAL_TABLET | Freq: Every day | ORAL | 11 refills | Status: AC

## 2016-09-10 NOTE — Assessment & Plan Note (Signed)
No action needed at this point

## 2016-09-10 NOTE — Progress Notes (Signed)
Subjective:    Patient ID: [name], male    DOB: 1929/06/28, 82 y.o.   MRN: [id number]  HPI Visit for review of chronic medical conditions Reviewed status with Luellen Pucker RN  He feels he is doing okay Still having trouble with bowels--- feels "blocked up all the time" He is inconsistent with miralax and colace though He has also used suppositories  Still has trouble with legs giving out Not major pain--hip seems better Has played some golf, but not recently Doesn't use walker or cane  No chest pain No palpitations Breathing is okay No edema No dizziness or syncope  Reviewed advanced directives Is willing to try resuscitation--but no prolonged support if cognitively unaware  Current Outpatient Prescriptions on File Prior to Visit  Medication Sig Dispense Refill  . cetirizine (ZYRTEC) 10 MG tablet Take 10 mg by mouth daily.     . cholecalciferol (VITAMIN D) 1000 UNITS tablet Take 1,000 Units by mouth every morning. Reported on 03/19/2016    . cyanocobalamin (,VITAMIN B-12,) 1000 MCG/ML injection Inject 1,000 mcg into the muscle every 30 (thirty) days.    . furosemide (LASIX) 20 MG tablet TAKE 1 TABLET BY MOUTH DAILY. (Patient taking differently: TAKE 1 TABLET BY MOUTH EVERY OTHER DAY) 30 tablet 11  . Glucosamine-Chondroit-Vit C-Mn (GLUCOSAMINE CHONDR 500 COMPLEX) CAPS Take 2 capsules by mouth daily.     . polyethylene glycol (MIRALAX / GLYCOLAX) packet Take 17 g by mouth daily.    . sodium chloride (OCEAN) 0.65 % SOLN nasal spray Place 1 spray into both nostrils as needed for congestion.    Marland Kitchen warfarin (COUMADIN) 5 MG tablet TAKE AS DIRECTED BY COUMADIN CLINIC 30 tablet 3   No current facility-administered medications on file prior to visit.     Allergies  Allergen Reactions  . Statins     Muscle pains  . Latex Rash    bandaids--no other latex problems    Past Medical History:  Diagnosis Date  . AAA (abdominal aortic aneurysm) without rupture ([place]) 03/03/2013   Small, partially thrombosed saccular aneurysm  . Arthritis    HANDS  . Atrial fibrillation ([place]) 02/10/2013  . BPH (benign prostatic hypertrophy)   . Cancer ([place])    SKIN CANCERS  . Chronic diastolic congestive heart failure ([place]) 03/20/2013  . Chronic kidney disease, stage III (moderate)   . Constipation   . Erectile dysfunction   . Hearing loss    BILATERAL - WEARS HEARING AIDS  . History of torn meniscus of left knee    PAINFUL LEFT KNEE  . Hyperlipidemia    statin intolerance  . Insomnia   . Mitral valve prolapse   . Mitral valve regurgitation   . OSA on CPAP   . S/P Maze operation for atrial fibrillation 03/15/2013   Complete bilateral lesion set using cryothermy and bipolar radiofrequency ablation with oversewing of LA appendage via right mini thoracotomy  . Spinal stenosis, thoracic 03/03/2013   T5-6    Past Surgical History:  Procedure Laterality Date  . CARPAL TUNNEL RELEASE     BILATERAL  . [place]  . INTRAOPERATIVE TRANSESOPHAGEAL ECHOCARDIOGRAM Right 03/15/2013   Procedure: INTRAOPERATIVE TRANSESOPHAGEAL ECHOCARDIOGRAM;  Surgeon: Rexene Alberts, MD;  Location: [facility];  Service: Open Heart Surgery;  Laterality: Right;  . MINIMALLY INVASIVE MAZE PROCEDURE Right 03/15/2013   Procedure: MINIMALLY INVASIVE MAZE PROCEDURE;  Surgeon: Rexene Alberts, MD;  Location: [facility];  Service: Open Heart Surgery;  Laterality:  Right;  (R) AXILLARY CANNULATION  . MITRAL VALVE REPAIR Right 03/15/2013   Procedure: MINIMALLY INVASIVE MITRAL VALVE REPAIR (MVR);  Surgeon: Rexene Alberts, MD;  Location: [facility];  Service: Open Heart Surgery;  Laterality: Right;  . RIGHT ROTATOR CUFF REPAIR    . SKIN CANCERS REMOVED FROM BOTH LEGS    . SKIN CANCERS REMOVED FROM LEFT FOREHEAD AND LEFT NOSE    . SURGERY LEFT NECK FOR REMOVAL OF METASTATIC LYMPH NODE ( FROM CANCER LEFT FOREHEAD)  AND LEFT NECK DISSECTION-REST OF LYMPH NODES NEGATIVE FOR ANY CANCER    . TEE WITHOUT CARDIOVERSION  N/A 01/31/2013   Procedure: TRANSESOPHAGEAL ECHOCARDIOGRAM (TEE);  Surgeon: Sueanne Margarita, MD;  [facility];  Service: Cardiovascular;  Laterality: N/A;    Family History  Problem Relation Age of Onset  . Coronary artery disease Father   . Heart disease Father   . Heart attack Father   . Diabetes Mother   . Arthritis Mother   . Congestive Heart Failure Mother   . Alzheimer's disease Mother   . Alzheimer's disease Sister     Social History   Social History  . Marital status: Widowed    Spouse name: N/A  . Number of children: 2  . Years of education: N/A   Occupational History  . retired Art gallery manager    Social History Main Topics  . Smoking status: Former Smoker    Packs/day: 1.00    Years: 20.00    Start date: 11/23/1960  . Smokeless tobacco: Never Used     Comment: [place]  . Alcohol use 4.2 oz/week    7 Standard drinks or equivalent per week     Comment: Cocktail or glass of wine with dinner  . Drug use: No  . Sexual activity: Not on file   Other Topics Concern  . Not on file   Social History Narrative   Has living will   Daughter [name] is health care POA   Would accept resuscitation attempts but no prolonged ventilation   Not sure about tube feeds   Review of Systems Appetite is good Weight up 2# Sleeps okay in general--nocturia x 1 No daytime voiding problems--- stream is fine.    Objective:   Physical Exam  Constitutional: He appears well-developed and well-nourished. No distress.  Neck: Normal range of motion. Neck supple. No thyromegaly present.  Cardiovascular: Normal rate, regular rhythm and normal heart sounds.  Exam reveals no gallop.   No murmur heard. Faint pedal pulse on left, 1+ on right  Pulmonary/Chest: Effort normal and breath sounds normal. No respiratory distress. He has no wheezes. He has no rales.  Abdominal: Soft. There is no tenderness.  Musculoskeletal: He exhibits no edema or tenderness.    Lymphadenopathy:    He has no cervical adenopathy.  Skin:  Mild stasis changes in calves  Psychiatric: He has a normal mood and affect. His behavior is normal.          Assessment & Plan:

## 2016-09-10 NOTE — Assessment & Plan Note (Signed)
Seems regular today (though 1 extra beat) On the coumadin

## 2016-09-10 NOTE — Assessment & Plan Note (Signed)
May be combination of spinal stenosis, deconditioning, ?vascular Doubt he can golf anymore

## 2016-09-10 NOTE — Assessment & Plan Note (Signed)
Mostly stiff in knees after prolonged sitting If worsens, would try tid tylenol arthritis

## 2016-09-10 NOTE — Assessment & Plan Note (Signed)
Compensated  No changes needed Neutral fluid status

## 2016-09-10 NOTE — Assessment & Plan Note (Signed)
Will change to senna and ask the staff to be sure he takes this and miralax every day

## 2016-10-01 LAB — PROTIME-INR

## 2016-10-02 ENCOUNTER — Encounter: Payer: Self-pay | Admitting: Internal Medicine

## 2016-10-26 ENCOUNTER — Encounter: Payer: Self-pay | Admitting: Internal Medicine

## 2016-10-29 LAB — POCT INR: INR: 2.7

## 2016-11-03 ENCOUNTER — Ambulatory Visit (INDEPENDENT_AMBULATORY_CARE_PROVIDER_SITE_OTHER): Payer: Medicare Other

## 2016-11-03 DIAGNOSIS — Z5181 Encounter for therapeutic drug level monitoring: Secondary | ICD-10-CM

## 2016-11-03 DIAGNOSIS — I480 Paroxysmal atrial fibrillation: Secondary | ICD-10-CM

## 2016-11-03 NOTE — Patient Instructions (Signed)
Pre visit review using our clinic review tool, if applicable. No additional management support is needed unless otherwise documented below in the visit note. 

## 2016-11-11 ENCOUNTER — Non-Acute Institutional Stay: Payer: Medicare Other | Admitting: Internal Medicine

## 2016-11-11 ENCOUNTER — Encounter: Payer: Self-pay | Admitting: Internal Medicine

## 2016-11-11 VITALS — BP 128/72 | HR 73 | Wt 173.0 lb

## 2016-11-11 DIAGNOSIS — M25512 Pain in left shoulder: Secondary | ICD-10-CM

## 2016-11-11 NOTE — Patient Instructions (Signed)
Shoulder Exercises Ask your health care provider which exercises are safe for you. Do exercises exactly as told by your health care provider and adjust them as directed. It is normal to feel mild stretching, pulling, tightness, or discomfort as you do these exercises, but you should stop right away if you feel sudden pain or your pain gets worse.Do not begin these exercises until told by your health care provider. RANGE OF MOTION EXERCISES  These exercises warm up your muscles and joints and improve the movement and flexibility of your shoulder. These exercises also help to relieve pain, numbness, and tingling. These exercises involve stretching your injured shoulder directly. Exercise A: Pendulum   1. Stand near a wall or a surface that you can hold onto for balance. 2. Bend at the waist and let your left / right arm hang straight down. Use your other arm to support you. Keep your back straight and do not lock your knees. 3. Relax your left / right arm and shoulder muscles, and move your hips and your trunk so your left / right arm swings freely. Your arm should swing because of the motion of your body, not because you are using your arm or shoulder muscles. 4. Keep moving your body so your arm swings in the following directions, as told by your health care provider:  Side to side.  Forward and backward.  In clockwise and counterclockwise circles. 5. Continue each motion for __________ seconds, or for as long as told by your health care provider. 6. Slowly return to the starting position. Repeat __________ times. Complete this exercise __________ times a day. Exercise B:Flexion, Standing   1. Stand and hold a broomstick, a cane, or a similar object. Place your hands a little more than shoulder-width apart on the object. Your left / right hand should be palm-up, and your other hand should be palm-down. 2. Keep your elbow straight and keep your shoulder muscles relaxed. Push the stick down  with your healthy arm to raise your left / right arm in front of your body, and then over your head until you feel a stretch in your shoulder.  Avoid shrugging your shoulder while you raise your arm. Keep your shoulder blade tucked down toward the middle of your back. 3. Hold for __________ seconds. 4. Slowly return to the starting position. Repeat __________ times. Complete this exercise __________ times a day. Exercise C: Abduction, Standing  1. Stand and hold a broomstick, a cane, or a similar object. Place your hands a little more than shoulder-width apart on the object. Your left / right hand should be palm-up, and your other hand should be palm-down. 2. While keeping your elbow straight and your shoulder muscles relaxed, push the stick across your body toward your left / right side. Raise your left / right arm to the side of your body and then over your head until you feel a stretch in your shoulder.  Do not raise your arm above shoulder height, unless your health care provider tells you to do that.  Avoid shrugging your shoulder while you raise your arm. Keep your shoulder blade tucked down toward the middle of your back. 3. Hold for __________ seconds. 4. Slowly return to the starting position. Repeat __________ times. Complete this exercise __________ times a day. Exercise D:Internal Rotation   1. Place your left / right hand behind your back, palm-up. 2. Use your other hand to dangle an exercise band, a towel, or a similar object over your   shoulder. Grasp the band with your left / right hand so you are holding onto both ends. 3. Gently pull up on the band until you feel a stretch in the front of your left / right shoulder.  Avoid shrugging your shoulder while you raise your arm. Keep your shoulder blade tucked down toward the middle of your back. 4. Hold for __________ seconds. 5. Release the stretch by letting go of the band and lowering your hands. Repeat __________ times.  Complete this exercise __________ times a day. STRETCHING EXERCISES  These exercises warm up your muscles and joints and improve the movement and flexibility of your shoulder. These exercises also help to relieve pain, numbness, and tingling. These exercises are done using your healthy shoulder to help stretch the muscles of your injured shoulder. Exercise E: Corner Stretch (External Rotation and Abduction)   1. Stand in a doorway with one of your feet slightly in front of the other. This is called a staggered stance. If you cannot reach your forearms to the door frame, stand facing a corner of a room. 2. Choose one of the following positions as told by your health care provider:  Place your hands and forearms on the door frame above your head.  Place your hands and forearms on the door frame at the height of your head.  Place your hands on the door frame at the height of your elbows. 3. Slowly move your weight onto your front foot until you feel a stretch across your chest and in the front of your shoulders. Keep your head and chest upright and keep your abdominal muscles tight. 4. Hold for __________ seconds. 5. To release the stretch, shift your weight to your back foot. Repeat __________ times. Complete this stretch __________ times a day. Exercise F:Extension, Standing  1. Stand and hold a broomstick, a cane, or a similar object behind your back.  Your hands should be a little wider than shoulder-width apart.  Your palms should face away from your back. 2. Keeping your elbows straight and keeping your shoulder muscles relaxed, move the stick away from your body until you feel a stretch in your shoulder.  Avoid shrugging your shoulders while you move the stick. Keep your shoulder blade tucked down toward the middle of your back. 3. Hold for __________ seconds. 4. Slowly return to the starting position. Repeat __________ times. Complete this exercise __________ times a  day. STRENGTHENING EXERCISES  These exercises build strength and endurance in your shoulder. Endurance is the ability to use your muscles for a long time, even after they get tired. Exercise G:External Rotation   1. Sit in a stable chair without armrests. 2. Secure an exercise band at elbow height on your left / right side. 3. Place a soft object, such as a folded towel or a small pillow, between your left / right upper arm and your body to move your elbow a few inches away (about 10 cm) from your side. 4. Hold the end of the band so it is tight and there is no slack. 5. Keeping your elbow pressed against the soft object, move your left / right forearm out, away from your abdomen. Keep your body steady so only your forearm moves. 6. Hold for __________ seconds. 7. Slowly return to the starting position. Repeat __________ times. Complete this exercise __________ times a day. Exercise H:Shoulder Abduction   1. Sit in a stable chair without armrests, or stand. 2. Hold a __________ weight in your left /   right hand, or hold an exercise band with both hands. 3. Start with your arms straight down and your left / right palm facing in, toward your body. 4. Slowly lift your left / right hand out to your side. Do not lift your hand above shoulder height unless your health care provider tells you that this is safe.  Keep your arms straight.  Avoid shrugging your shoulder while you do this movement. Keep your shoulder blade tucked down toward the middle of your back. 5. Hold for __________ seconds. 6. Slowly lower your arm, and return to the starting position. Repeat __________ times. Complete this exercise __________ times a day. Exercise I:Shoulder Extension  1. Sit in a stable chair without armrests, or stand. 2. Secure an exercise band to a stable object in front of you where it is at shoulder height. 3. Hold one end of the exercise band in each hand. Your palms should face each  other. 4. Straighten your elbows and lift your hands up to shoulder height. 5. Step back, away from the secured end of the exercise band, until the band is tight and there is no slack. 6. Squeeze your shoulder blades together as you pull your hands down to the sides of your thighs. Stop when your hands are straight down by your sides. Do not let your hands go behind your body. 7. Hold for __________ seconds. 8. Slowly return to the starting position. Repeat __________ times. Complete this exercise __________ times a day. Exercise J:Standing Shoulder Row  1. Sit in a stable chair without armrests, or stand. 2. Secure an exercise band to a stable object in front of you so it is at waist height. 3. Hold one end of the exercise band in each hand. Your palms should be in a thumbs-up position. 4. Bend each of your elbows to an "L" shape (about 90 degrees) and keep your upper arms at your sides. 5. Step back until the band is tight and there is no slack. 6. Slowly pull your elbows back behind you. 7. Hold for __________ seconds. 8. Slowly return to the starting position. Repeat __________ times. Complete this exercise __________ times a day. Exercise K:Shoulder Press-Ups   1. Sit in a stable chair that has armrests. Sit upright, with your feet flat on the floor. 2. Put your hands on the armrests so your elbows are bent and your fingers are pointing forward. Your hands should be about even with the sides of your body. 3. Push down on the armrests and use your arms to lift yourself off of the chair. Straighten your elbows and lift yourself up as much as you comfortably can.  Move your shoulder blades down, and avoid letting your shoulders move up toward your ears.  Keep your feet on the ground. As you get stronger, your feet should support less of your body weight as you lift yourself up. 4. Hold for __________ seconds. 5. Slowly lower yourself back into the chair. Repeat __________ times.  Complete this exercise __________ times a day. Exercise L: Wall Push-Ups   1. Stand so you are facing a stable wall. Your feet should be about one arm-length away from the wall. 2. Lean forward and place your palms on the wall at shoulder height. 3. Keep your feet flat on the floor as you bend your elbows and lean forward toward the wall. 4. Hold for __________ seconds. 5. Straighten your elbows to push yourself back to the starting position. Repeat __________ times. Complete   this exercise __________ times a day. This information is not intended to replace advice given to you by your health care provider. Make sure you discuss any questions you have with your health care provider. Document Released: 09/23/2005 Document Revised: 08/03/2016 Document Reviewed: 07/21/2015 [employer] Interactive Patient Education  2017 [employer]  

## 2016-11-11 NOTE — Progress Notes (Signed)
Subjective:    Patient ID: [name], male    DOB: [birth year], 82 y.o.   MRN: [id number]  HPI  Asked to assess resident in [place] left shoulder pain No injury, but about 1-2 months ago, noticed sharp pain in left shoulder while bathing Improved, but intermittently gets sharp stabbing pain in the left shoulder that radiates down the left arm No numbness, tingling or weakness Has taken Tylenol with some relief  Review of Systems      Past Medical History:  Diagnosis Date  . AAA (abdominal aortic aneurysm) without rupture ([place]) 03/03/2013   Small, partially thrombosed saccular aneurysm  . Arthritis    HANDS  . Atrial fibrillation ([place]) 02/10/2013  . BPH (benign prostatic hypertrophy)   . Cancer ([place])    SKIN CANCERS  . Chronic diastolic congestive heart failure ([place]) 03/20/2013  . Chronic kidney disease, stage III (moderate)   . Constipation   . Erectile dysfunction   . Hearing loss    BILATERAL - WEARS HEARING AIDS  . History of torn meniscus of left knee    PAINFUL LEFT KNEE  . Hyperlipidemia    statin intolerance  . Insomnia   . Mitral valve prolapse   . Mitral valve regurgitation   . OSA on CPAP   . S/P Maze operation for atrial fibrillation 03/15/2013   Complete bilateral lesion set using cryothermy and bipolar radiofrequency ablation with oversewing of LA appendage via right mini thoracotomy  . Spinal stenosis, thoracic 03/03/2013   T5-6    Current Outpatient Prescriptions  Medication Sig Dispense Refill  . cetirizine (ZYRTEC) 10 MG tablet Take 10 mg by mouth daily.     . cholecalciferol (VITAMIN D) 1000 UNITS tablet Take 1,000 Units by mouth every morning. Reported on 03/19/2016    . cyanocobalamin (,VITAMIN B-12,) 1000 MCG/ML injection Inject 1,000 mcg into the muscle every 30 (thirty) days.    . furosemide (LASIX) 20 MG tablet TAKE 1 TABLET BY MOUTH DAILY. (Patient taking differently: TAKE 1 TABLET BY MOUTH EVERY OTHER DAY) 30 tablet 11  .  Glucosamine-Chondroit-Vit C-Mn (GLUCOSAMINE CHONDR 500 COMPLEX) CAPS Take 2 capsules by mouth daily.     . polyethylene glycol (MIRALAX / GLYCOLAX) packet Take 17 g by mouth daily.    Marland Kitchen senna-docusate (SENOKOT-S) 8.6-50 MG tablet Take 2 tablets by mouth daily. 60 tablet 11  . sodium chloride (OCEAN) 0.65 % SOLN nasal spray Place 1 spray into both nostrils as needed for congestion.    Marland Kitchen warfarin (COUMADIN) 5 MG tablet TAKE AS DIRECTED BY COUMADIN CLINIC 30 tablet 3   No current facility-administered medications for this visit.     Allergies  Allergen Reactions  . Statins     Muscle pains  . Latex Rash    bandaids--no other latex problems    Family History  Problem Relation Age of Onset  . Coronary artery disease Father   . Heart disease Father   . Heart attack Father   . Diabetes Mother   . Arthritis Mother   . Congestive Heart Failure Mother   . Alzheimer's disease Mother   . Alzheimer's disease Sister     Social History   Social History  . Marital status: Widowed    Spouse name: N/A  . Number of children: 2  . Years of education: N/A   Occupational History  . retired Art gallery manager    Social History Main Topics  . Smoking status: Former Smoker    Packs/day:  1.00    Years: 20.00    Start date: 11/23/1960  . Smokeless tobacco: Never Used     Comment: [place]  . Alcohol use 4.2 oz/week    7 Standard drinks or equivalent per week     Comment: Cocktail or glass of wine with dinner  . Drug use: No  . Sexual activity: Not on file   Other Topics Concern  . Not on file   Social History Narrative   Has living will   Daughter [name] is health care POA   Would accept resuscitation attempts but no prolonged ventilation   Not sure about tube feeds     Constitutional: Denies fever, malaise, fatigue, headache or abrupt weight changes.  Musculoskeletal: Pt reports left shoulder pain. Denies decrease in range of motion, difficulty with gait, muscle pain or  joint swelling.    No other specific complaints in a complete review of systems (except as listed in HPI above).  Objective:   Physical Exam   BP 128/72   Pulse 73   Wt 173 lb (78.5 kg)   BMI 24.82 kg/m  Wt Readings from Last 3 Encounters:  11/11/16 173 lb (78.5 kg)  09/10/16 169 lb 12.8 oz (77 kg)  06/11/16 167 lb (75.8 kg)    General: Appears his stated age, well developed, well nourished in NAD. Musculoskeletal: Normal internal and external rotation of the left shoulder. Normal abduction and adduction. No pain with palpation over the [facility] joint, or subacromial bursa. Pain with palpation over the proximal biceps tendon. Negative drop can test. Strength 5/5 BUE. Neurological: Sensation intact to BUE.  BMET    Component Value Date/Time   NA 137 10/15/2015 1050   K 4.7 10/15/2015 1050   CL 102 10/15/2015 1050   CO2 26 10/15/2015 1050   GLUCOSE 71 10/15/2015 1050   BUN 25 10/15/2015 1050   CREATININE 1.71 (H) 10/15/2015 1050   CALCIUM 9.2 10/15/2015 1050   GFRNONAA 44 (L) 05/01/2014 1822   GFRAA 52 (L) 05/01/2014 1822    Lipid Panel  No results found for: CHOL, TRIG, HDL, CHOLHDL, VLDL, LDLCALC  CBC    Component Value Date/Time   WBC 9.2 05/01/2014 1822   RBC 4.24 05/01/2014 1822   HGB 12.9 (L) 05/01/2014 1822   HCT 39.2 05/01/2014 1822   PLT 215 05/01/2014 1822   MCV 92.5 05/01/2014 1822   MCH 30.4 05/01/2014 1822   MCHC 32.9 05/01/2014 1822   RDW 12.6 05/01/2014 1822    Hgb A1C Lab Results  Component Value Date   HGBA1C 5.7 (H) 03/13/2013           Assessment & Plan:   Left shoulder pain:  Secondary to biceps tendonitis He insist on xray, requesting joint injection today Advised him I do not do joint injections, but will check with Dr. Silvio Pate after xray is back Ice may be helpful.  Continue Tylenol for now May benefit from PT in the future if pain persist  Will follow up after xray is back Webb Silversmith, NP

## 2016-11-19 ENCOUNTER — Encounter: Payer: Self-pay | Admitting: Internal Medicine

## 2016-11-19 ENCOUNTER — Non-Acute Institutional Stay (INDEPENDENT_AMBULATORY_CARE_PROVIDER_SITE_OTHER): Payer: Medicare Other | Admitting: Internal Medicine

## 2016-11-19 VITALS — BP 138/70 | HR 68 | Temp 97.8°F | Resp 16 | Wt 173.0 lb

## 2016-11-19 DIAGNOSIS — M19012 Primary osteoarthritis, left shoulder: Secondary | ICD-10-CM | POA: Diagnosis not present

## 2016-11-19 MED ORDER — METHYLPREDNISOLONE ACETATE 40 MG/ML IJ SUSP
40.0000 mg | Freq: Once | INTRAMUSCULAR | Status: AC
  Administered 2016-11-19: 40 mg via INTRAMUSCULAR

## 2016-11-19 NOTE — Assessment & Plan Note (Signed)
PROCEDURE Sterile prep posterior approach Ethyl chloride then 2cc lain 2% lidocaine 40mg  depomedrol and 5cc 2% lidocaine instilled in left shoulder (significant osteophytes noted) Tolerated well Discussed care with him and Laurence Aly

## 2016-11-19 NOTE — Progress Notes (Signed)
Subjective:    Patient ID: [name], male    DOB: 21-Mar-1929, 82 y.o.   MRN: [id number]  HPI Visit for injection in left shoulder Has had increasing pain X-ray shows just moderate osteoarthritis  Will have shooting pains down arm at times as well Especially bad with tasks with more abduction  Current Outpatient Prescriptions on File Prior to Visit  Medication Sig Dispense Refill  . cetirizine (ZYRTEC) 10 MG tablet Take 10 mg by mouth daily.     . cholecalciferol (VITAMIN D) 1000 UNITS tablet Take 1,000 Units by mouth every morning. Reported on 03/19/2016    . cyanocobalamin (,VITAMIN B-12,) 1000 MCG/ML injection Inject 1,000 mcg into the muscle every 30 (thirty) days.    . furosemide (LASIX) 20 MG tablet TAKE 1 TABLET BY MOUTH DAILY. (Patient taking differently: TAKE 1 TABLET BY MOUTH EVERY OTHER DAY) 30 tablet 11  . Glucosamine-Chondroit-Vit C-Mn (GLUCOSAMINE CHONDR 500 COMPLEX) CAPS Take 2 capsules by mouth daily.     . polyethylene glycol (MIRALAX / GLYCOLAX) packet Take 17 g by mouth daily.    Marland Kitchen senna-docusate (SENOKOT-S) 8.6-50 MG tablet Take 2 tablets by mouth daily. 60 tablet 11  . sodium chloride (OCEAN) 0.65 % SOLN nasal spray Place 1 spray into both nostrils as needed for congestion.    Marland Kitchen warfarin (COUMADIN) 5 MG tablet TAKE AS DIRECTED BY COUMADIN CLINIC 30 tablet 3   No current facility-administered medications on file prior to visit.     Allergies  Allergen Reactions  . Statins     Muscle pains  . Latex Rash    bandaids--no other latex problems    Past Medical History:  Diagnosis Date  . AAA (abdominal aortic aneurysm) without rupture ([place]) 03/03/2013   Small, partially thrombosed saccular aneurysm  . Arthritis    HANDS  . Atrial fibrillation ([place]) 02/10/2013  . BPH (benign prostatic hypertrophy)   . Cancer ([place])    SKIN CANCERS  . Chronic diastolic congestive heart failure ([place]) 03/20/2013  . Chronic kidney disease, stage III (moderate)   .  Constipation   . Erectile dysfunction   . Hearing loss    BILATERAL - WEARS HEARING AIDS  . History of torn meniscus of left knee    PAINFUL LEFT KNEE  . Hyperlipidemia    statin intolerance  . Insomnia   . Mitral valve prolapse   . Mitral valve regurgitation   . OSA on CPAP   . S/P Maze operation for atrial fibrillation 03/15/2013   Complete bilateral lesion set using cryothermy and bipolar radiofrequency ablation with oversewing of LA appendage via right mini thoracotomy  . Spinal stenosis, thoracic 03/03/2013   T5-6    Past Surgical History:  Procedure Laterality Date  . CARPAL TUNNEL RELEASE     BILATERAL  . [place]  . INTRAOPERATIVE TRANSESOPHAGEAL ECHOCARDIOGRAM Right 03/15/2013   Procedure: INTRAOPERATIVE TRANSESOPHAGEAL ECHOCARDIOGRAM;  Surgeon: Rexene Alberts, MD;  Location: [place];  Service: Open Heart Surgery;  Laterality: Right;  . MINIMALLY INVASIVE MAZE PROCEDURE Right 03/15/2013   Procedure: MINIMALLY INVASIVE MAZE PROCEDURE;  Surgeon: Rexene Alberts, MD;  Location: [place];  Service: Open Heart Surgery;  Laterality: Right;  (R) AXILLARY CANNULATION  . MITRAL VALVE REPAIR Right 03/15/2013   Procedure: MINIMALLY INVASIVE MITRAL VALVE REPAIR (MVR);  Surgeon: Rexene Alberts, MD;  Location: [place];  Service: Open Heart Surgery;  Laterality: Right;  . RIGHT ROTATOR CUFF REPAIR    .  SKIN CANCERS REMOVED FROM BOTH LEGS    . SKIN CANCERS REMOVED FROM LEFT FOREHEAD AND LEFT NOSE    . SURGERY LEFT NECK FOR REMOVAL OF METASTATIC LYMPH NODE ( FROM CANCER LEFT FOREHEAD)  AND LEFT NECK DISSECTION-REST OF LYMPH NODES NEGATIVE FOR ANY CANCER    . TEE WITHOUT CARDIOVERSION N/A 01/31/2013   Procedure: TRANSESOPHAGEAL ECHOCARDIOGRAM (TEE);  Surgeon: Sueanne Margarita, MD;  [facility];  Service: Cardiovascular;  Laterality: N/A;    Family History  Problem Relation Age of Onset  . Coronary artery disease Father   . Heart disease Father   . Heart attack Father    . Diabetes Mother   . Arthritis Mother   . Congestive Heart Failure Mother   . Alzheimer's disease Mother   . Alzheimer's disease Sister     Social History   Social History  . Marital status: Widowed    Spouse name: N/A  . Number of children: 2  . Years of education: N/A   Occupational History  . retired Art gallery manager    Social History Main Topics  . Smoking status: Former Smoker    Packs/day: 1.00    Years: 20.00    Start date: 11/23/1960  . Smokeless tobacco: Never Used     Comment: [place]  . Alcohol use 4.2 oz/week    7 Standard drinks or equivalent per week     Comment: Cocktail or glass of wine with dinner  . Drug use: No  . Sexual activity: Not on file   Other Topics Concern  . Not on file   Social History Narrative   Has living will   Daughter [name] is health care POA   Would accept resuscitation attempts but no prolonged ventilation   Not sure about tube feeds   Review of Systems     Objective:   Physical Exam  Musculoskeletal:  No swelling in left shoulder No tenderness at bursa or tendons Does have clunk with ROM and some crepitus Pain with internal rotation and abduction          Assessment & Plan:

## 2016-11-19 NOTE — Addendum Note (Signed)
Addended by: Pilar Grammes on: 11/19/2016 12:01 PM   Modules accepted: Orders

## 2016-11-26 ENCOUNTER — Ambulatory Visit (INDEPENDENT_AMBULATORY_CARE_PROVIDER_SITE_OTHER): Payer: Medicare Other

## 2016-11-26 DIAGNOSIS — I480 Paroxysmal atrial fibrillation: Secondary | ICD-10-CM

## 2016-11-26 DIAGNOSIS — Z5181 Encounter for therapeutic drug level monitoring: Secondary | ICD-10-CM

## 2016-11-26 LAB — POCT INR: INR: 2.9

## 2016-11-26 NOTE — Patient Instructions (Signed)
Pre visit review using our clinic review tool, if applicable. No additional management support is needed unless otherwise documented below in the visit note. 

## 2016-12-10 ENCOUNTER — Encounter: Payer: Self-pay | Admitting: Vascular Surgery

## 2016-12-15 ENCOUNTER — Encounter: Payer: Self-pay | Admitting: Vascular Surgery

## 2016-12-15 ENCOUNTER — Ambulatory Visit (INDEPENDENT_AMBULATORY_CARE_PROVIDER_SITE_OTHER)
Admission: RE | Admit: 2016-12-15 | Discharge: 2016-12-15 | Disposition: A | Discharge status: Home / Self Care | Payer: Medicare Other | Source: Ambulatory Visit | Attending: Vascular Surgery | Admitting: Vascular Surgery

## 2016-12-15 ENCOUNTER — Ambulatory Visit (INDEPENDENT_AMBULATORY_CARE_PROVIDER_SITE_OTHER): Payer: Medicare Other | Admitting: Vascular Surgery

## 2016-12-15 ENCOUNTER — Ambulatory Visit (HOSPITAL_COMMUNITY)
Admission: RE | Admit: 2016-12-15 | Discharge: 2016-12-15 | Disposition: A | Discharge status: Home / Self Care | Payer: Medicare Other | Source: Ambulatory Visit | Attending: Vascular Surgery | Admitting: Vascular Surgery

## 2016-12-15 VITALS — BP 144/76 | HR 69 | Temp 97.0°F | Resp 18 | Ht 69.0 in | Wt 174.6 lb

## 2016-12-15 DIAGNOSIS — R2243 Localized swelling, mass and lump, lower limb, bilateral: Secondary | ICD-10-CM | POA: Insufficient documentation

## 2016-12-15 DIAGNOSIS — I714 Abdominal aortic aneurysm, without rupture, unspecified: Secondary | ICD-10-CM

## 2016-12-15 NOTE — Progress Notes (Signed)
[facility] [place]  Patient name: [name] MRN: [id number] DOB: 02/22/1929 Sex: male  REASON FOR VISIT: Follow-up abdominal aortic aneurysm  HPI: [name] is a 83 y.o. male here today for follow-up with ultrasound of his abdominal aortic aneurysm. Also for imaging of his popliteal arteries. He reports that he continues to do well with no new major difficulties. He reports some shoulder discomfort but is been told that the can treat this with physical therapy. No symptoms referable to his aneurysm  Past Medical History:  Diagnosis Date  . AAA (abdominal aortic aneurysm) without rupture ([place]) 03/03/2013   Small, partially thrombosed saccular aneurysm  . Arthritis    HANDS  . Atrial fibrillation ([place]) 02/10/2013  . BPH (benign prostatic hypertrophy)   . Cancer ([place])    SKIN CANCERS  . Chronic diastolic congestive heart failure ([place]) 03/20/2013  . Chronic kidney disease, stage III (moderate)   . Constipation   . Erectile dysfunction   . Hearing loss    BILATERAL - WEARS HEARING AIDS  . History of torn meniscus of left knee    PAINFUL LEFT KNEE  . Hyperlipidemia    statin intolerance  . Insomnia   . Mitral valve prolapse   . Mitral valve regurgitation   . OSA on CPAP   . S/P Maze operation for atrial fibrillation 03/15/2013   Complete bilateral lesion set using cryothermy and bipolar radiofrequency ablation with oversewing of LA appendage via right mini thoracotomy  . Spinal stenosis, thoracic 03/03/2013   T5-6    Family History  Problem Relation Age of Onset  . Coronary artery disease Father   . Heart disease Father   . Heart attack Father   . Diabetes Mother   . Arthritis Mother   . Congestive Heart Failure Mother   . Alzheimer's disease Mother   . Alzheimer's disease Sister     SOCIAL HISTORY: Social History  Substance Use Topics  . Smoking status: Former Smoker    Packs/day: 1.00    Years:  20.00    Start date: 11/23/1960  . Smokeless tobacco: Never Used     Comment: [place]  . Alcohol use 4.2 oz/week    7 Standard drinks or equivalent per week     Comment: Cocktail or glass of wine with dinner    Allergies  Allergen Reactions  . Statins     Muscle pains  . Latex Rash    bandaids--no other latex problems    Current Outpatient Prescriptions  Medication Sig Dispense Refill  . cetirizine (ZYRTEC) 10 MG tablet Take 10 mg by mouth daily.     . cholecalciferol (VITAMIN D) 1000 UNITS tablet Take 1,000 Units by mouth every morning. Reported on 03/19/2016    . cyanocobalamin (,VITAMIN B-12,) 1000 MCG/ML injection Inject 1,000 mcg into the muscle every 14 (fourteen) days.     . furosemide (LASIX) 20 MG tablet TAKE 1 TABLET BY MOUTH DAILY. (Patient taking differently: TAKE 1 TABLET BY MOUTH EVERY OTHER DAY) 30 tablet 11  . Glucosamine-Chondroit-Vit C-Mn (GLUCOSAMINE CHONDR 500 COMPLEX) CAPS Take 2 capsules by mouth daily.     . polyethylene glycol (MIRALAX / GLYCOLAX) packet Take 17 g by mouth daily.    Marland Kitchen senna-docusate (SENOKOT-S) 8.6-50 MG tablet Take 2 tablets by mouth daily. 60 tablet 11  . sodium chloride (OCEAN) 0.65 % SOLN nasal spray Place 1 spray into both nostrils as needed for congestion.    Marland Kitchen warfarin (  COUMADIN) 5 MG tablet TAKE AS DIRECTED BY COUMADIN CLINIC 30 tablet 3   No current facility-administered medications for this visit.     REVIEW OF SYSTEMS:  [X]  denotes positive finding, [ ]  denotes negative finding Cardiac  Comments:  Chest pain or chest pressure:    Shortness of breath upon exertion:    Short of breath when lying flat:    Irregular heart rhythm:        Vascular    Pain in calf, thigh, or hip brought on by ambulation:    Pain in feet at night that wakes you up from your sleep:     Blood clot in your veins:    Leg swelling:           PHYSICAL EXAM: Vitals:   12/15/16 1018 12/15/16 1023  BP: (!) 155/80 (!) 144/76  Pulse: 69     Resp: 18   Temp: 97 F (36.1 C)   TempSrc: Oral   SpO2: 97%   Weight: 174 lb 9.6 oz (79.2 kg)   Height: 5\' 9"  (1.753 m)     GENERAL: The patient is a well-nourished male, in no acute distress. The vital signs are documented above. CARDIOVASCULAR: 2+ radial 2+ popliteal pulses. Some prominence in his popliteal arteries. No abdominal aortic aneurysm palpable PULMONARY: There is good air exchange  MUSCULOSKELETAL: There are no major deformities or cyanosis. NEUROLOGIC: No focal weakness or paresthesias are detected. SKIN: There are no ulcers or rashes noted. PSYCHIATRIC: The patient has a normal affect.  DATA:  Ultrasound today reveals no change with maximal diameter of his infrarenal aorta of 3.4 cm prior study showed maximal diameter of 3.5 cm.  Popliteal arteries 1 cm bilaterally  MEDICAL ISSUES: Discussed this at length with the patient and his daughter present. I recommend two-year follow-up with his a small aneurysm. Explained that he currently has essentially 0 risk for rupture related to this small aneurysm and that would predict small growth over time at his current size.    Rosetta Posner, MD FACS [facility] Tel [phone] Pager [phone]

## 2016-12-24 ENCOUNTER — Ambulatory Visit (INDEPENDENT_AMBULATORY_CARE_PROVIDER_SITE_OTHER): Payer: Medicare Other

## 2016-12-24 DIAGNOSIS — I480 Paroxysmal atrial fibrillation: Secondary | ICD-10-CM

## 2016-12-24 DIAGNOSIS — Z5181 Encounter for therapeutic drug level monitoring: Secondary | ICD-10-CM

## 2016-12-24 LAB — POCT INR: INR: 2.8

## 2016-12-24 NOTE — Patient Instructions (Signed)
Pre visit review using our clinic review tool, if applicable. No additional management support is needed unless otherwise documented below in the visit note. 

## 2017-01-14 ENCOUNTER — Ambulatory Visit: Payer: Medicare Other | Admitting: Physical Therapy

## 2017-01-14 ENCOUNTER — Encounter: Payer: Self-pay | Admitting: Internal Medicine

## 2017-01-14 ENCOUNTER — Non-Acute Institutional Stay: Payer: Medicare Other | Admitting: Internal Medicine

## 2017-01-14 VITALS — BP 140/60 | HR 64 | Temp 97.3°F | Resp 16 | Wt 175.0 lb

## 2017-01-14 DIAGNOSIS — I714 Abdominal aortic aneurysm, without rupture, unspecified: Secondary | ICD-10-CM

## 2017-01-14 DIAGNOSIS — M150 Primary generalized (osteo)arthritis: Secondary | ICD-10-CM

## 2017-01-14 DIAGNOSIS — M4804 Spinal stenosis, thoracic region: Secondary | ICD-10-CM

## 2017-01-14 DIAGNOSIS — M159 Polyosteoarthritis, unspecified: Secondary | ICD-10-CM

## 2017-01-14 DIAGNOSIS — I480 Paroxysmal atrial fibrillation: Secondary | ICD-10-CM

## 2017-01-14 DIAGNOSIS — I5032 Chronic diastolic (congestive) heart failure: Secondary | ICD-10-CM

## 2017-01-14 NOTE — Assessment & Plan Note (Signed)
Knees and shoulders Did get some help from cortisone in shoulder--would consider again as time goes by Worst is AM stiffness---discussed heat rub at night

## 2017-01-14 NOTE — Assessment & Plan Note (Signed)
Regular today On coumadin

## 2017-01-14 NOTE — Progress Notes (Signed)
Subjective:    Patient ID: [name], male    DOB: 1929-08-02, 83 y.o.   MRN: [id number]  HPI Assisted living visit for review of chronic health conditions Reviewed status with Luellen Pucker RN  He did note some improvement in shoulder after the cortisone shot Went back to his orthopedic---no new diagnosis and didn't find much arthritis on x-ray Got some exercises to do to strengthen his arm, etc Notices pain mostly in the morning--better as he moves around Discussed trying heat before bedtime  Recent visit with Dr Kellie Simmering about his  Still small ---no intervention needed No sig leg pain--no change in some degree of leg weakness Also notes some knee arthritis pain  No apparent heart problems No chest pain No palpitations or dizziness Works out in fitness center regularly---stamina is fair (?related to his leg symptoms) Slight edema at times--nothing significant Sleeps flat-- no PND  Current Outpatient Prescriptions on File Prior to Visit  Medication Sig Dispense Refill  . cetirizine (ZYRTEC) 10 MG tablet Take 10 mg by mouth daily.     . cholecalciferol (VITAMIN D) 1000 UNITS tablet Take 1,000 Units by mouth every morning. Reported on 03/19/2016    . cyanocobalamin (,VITAMIN B-12,) 1000 MCG/ML injection Inject 1,000 mcg into the muscle every 14 (fourteen) days.     . furosemide (LASIX) 20 MG tablet TAKE 1 TABLET BY MOUTH DAILY. (Patient taking differently: TAKE 1 TABLET BY MOUTH EVERY OTHER DAY) 30 tablet 11  . Glucosamine-Chondroit-Vit C-Mn (GLUCOSAMINE CHONDR 500 COMPLEX) CAPS Take 2 capsules by mouth daily.     . polyethylene glycol (MIRALAX / GLYCOLAX) packet Take 17 g by mouth daily.    Marland Kitchen senna-docusate (SENOKOT-S) 8.6-50 MG tablet Take 2 tablets by mouth daily. 60 tablet 11  . sodium chloride (OCEAN) 0.65 % SOLN nasal spray Place 1 spray into both nostrils as needed for congestion.    Marland Kitchen warfarin (COUMADIN) 5 MG tablet TAKE AS DIRECTED BY COUMADIN CLINIC 30 tablet 3   No  current facility-administered medications on file prior to visit.     Allergies  Allergen Reactions  . Statins     Muscle pains  . Latex Rash    bandaids--no other latex problems    Past Medical History:  Diagnosis Date  . AAA (abdominal aortic aneurysm) without rupture ([place]) 03/03/2013   Small, partially thrombosed saccular aneurysm  . Arthritis    HANDS  . Atrial fibrillation ([place]) 02/10/2013  . BPH (benign prostatic hypertrophy)   . Cancer ([place])    SKIN CANCERS  . Chronic diastolic congestive heart failure ([place]) 03/20/2013  . Chronic kidney disease, stage III (moderate)   . Constipation   . Erectile dysfunction   . Hearing loss    BILATERAL - WEARS HEARING AIDS  . History of torn meniscus of left knee    PAINFUL LEFT KNEE  . Hyperlipidemia    statin intolerance  . Insomnia   . Mitral valve prolapse   . Mitral valve regurgitation   . OSA on CPAP   . S/P Maze operation for atrial fibrillation 03/15/2013   Complete bilateral lesion set using cryothermy and bipolar radiofrequency ablation with oversewing of LA appendage via right mini thoracotomy  . Spinal stenosis, thoracic 03/03/2013   T5-6    Past Surgical History:  Procedure Laterality Date  . CARPAL TUNNEL RELEASE     BILATERAL  . [place]  . INTRAOPERATIVE TRANSESOPHAGEAL ECHOCARDIOGRAM Right 03/15/2013   Procedure: INTRAOPERATIVE TRANSESOPHAGEAL ECHOCARDIOGRAM;  Surgeon: Rexene Alberts, MD;  Location: [place];  Service: Open Heart Surgery;  Laterality: Right;  . MINIMALLY INVASIVE MAZE PROCEDURE Right 03/15/2013   Procedure: MINIMALLY INVASIVE MAZE PROCEDURE;  Surgeon: Rexene Alberts, MD;  Location: [place];  Service: Open Heart Surgery;  Laterality: Right;  (R) AXILLARY CANNULATION  . MITRAL VALVE REPAIR Right 03/15/2013   Procedure: MINIMALLY INVASIVE MITRAL VALVE REPAIR (MVR);  Surgeon: Rexene Alberts, MD;  Location: [place];  Service: Open Heart Surgery;  Laterality: Right;  . RIGHT ROTATOR CUFF  REPAIR    . SKIN CANCERS REMOVED FROM BOTH LEGS    . SKIN CANCERS REMOVED FROM LEFT FOREHEAD AND LEFT NOSE    . SURGERY LEFT NECK FOR REMOVAL OF METASTATIC LYMPH NODE ( FROM CANCER LEFT FOREHEAD)  AND LEFT NECK DISSECTION-REST OF LYMPH NODES NEGATIVE FOR ANY CANCER    . TEE WITHOUT CARDIOVERSION N/A 01/31/2013   Procedure: TRANSESOPHAGEAL ECHOCARDIOGRAM (TEE);  Surgeon: Sueanne Margarita, MD;  Location: [facility] ENDOSCOPY;  Service: Cardiovascular;  Laterality: N/A;    Family History  Problem Relation Age of Onset  . Coronary artery disease Father   . Heart disease Father   . Heart attack Father   . Diabetes Mother   . Arthritis Mother   . Congestive Heart Failure Mother   . Alzheimer's disease Mother   . Alzheimer's disease Sister     Social History   Social History  . Marital status: Widowed    Spouse name: N/A  . Number of children: 2  . Years of education: N/A   Occupational History  . retired Art gallery manager    Social History Main Topics  . Smoking status: Former Smoker    Packs/day: 1.00    Years: 20.00    Start date: 11/23/1960  . Smokeless tobacco: Never Used     Comment: [place]  . Alcohol use 4.2 oz/week    7 Standard drinks or equivalent per week     Comment: Cocktail or glass of wine with dinner  . Drug use: No  . Sexual activity: Not on file   Other Topics Concern  . Not on file   Social History Narrative   Has living will   Daughter [name] is health care POA   Would accept resuscitation attempts but no prolonged ventilation   Not sure about tube feeds   Review of Systems  Sleeps okay in general Nocturia x 1 generally. No daytime problems. Appetite is good Weight is stable Bowels are still slow--mushy now though and not as constipated     Objective:   Physical Exam  Constitutional: He appears well-nourished. No distress.  Neck: No thyromegaly present.  Cardiovascular: Normal rate, regular rhythm and normal heart sounds.   No murmur  heard. Pulmonary/Chest: Effort normal and breath sounds normal. No respiratory distress. He has no wheezes. He has no rales.  Abdominal: Soft. There is no tenderness.  Musculoskeletal:  1+ ankle edema  Lymphadenopathy:    He has no cervical adenopathy.  Skin:  Stasis changes in calves without ulcers Ecchymoses on arms and legs  Psychiatric: He has a normal mood and affect.          Assessment & Plan:

## 2017-01-14 NOTE — Assessment & Plan Note (Signed)
Recent eval by Dr Merdis Delay and no action needed

## 2017-01-14 NOTE — Assessment & Plan Note (Signed)
Mild edema persists but no respiratory problems Continues to work out [employer] with every other day furosemide still

## 2017-01-14 NOTE — Assessment & Plan Note (Signed)
No major pain now Likely part of his chronic leg weakness

## 2017-01-19 ENCOUNTER — Encounter: Payer: Medicare Other | Admitting: Physical Therapy

## 2017-01-21 ENCOUNTER — Encounter: Payer: Medicare Other | Admitting: Physical Therapy

## 2017-01-21 LAB — POCT INR: INR: 3.4

## 2017-01-22 ENCOUNTER — Ambulatory Visit (INDEPENDENT_AMBULATORY_CARE_PROVIDER_SITE_OTHER): Payer: Medicare Other

## 2017-01-22 DIAGNOSIS — Z5181 Encounter for therapeutic drug level monitoring: Secondary | ICD-10-CM

## 2017-01-22 DIAGNOSIS — I480 Paroxysmal atrial fibrillation: Secondary | ICD-10-CM

## 2017-01-22 NOTE — Patient Instructions (Signed)
Pre visit review using our clinic review tool, if applicable. No additional management support is needed unless otherwise documented below in the visit note. 

## 2017-02-04 ENCOUNTER — Ambulatory Visit (INDEPENDENT_AMBULATORY_CARE_PROVIDER_SITE_OTHER): Payer: Medicare Other

## 2017-02-04 DIAGNOSIS — Z5181 Encounter for therapeutic drug level monitoring: Secondary | ICD-10-CM

## 2017-02-04 DIAGNOSIS — I480 Paroxysmal atrial fibrillation: Secondary | ICD-10-CM

## 2017-02-04 LAB — POCT INR: INR: 2.7

## 2017-02-04 NOTE — Patient Instructions (Signed)
Pre visit review using our clinic review tool, if applicable. No additional management support is needed unless otherwise documented below in the visit note. 

## 2017-03-04 LAB — POCT INR: INR: 3.1

## 2017-03-05 ENCOUNTER — Ambulatory Visit (INDEPENDENT_AMBULATORY_CARE_PROVIDER_SITE_OTHER): Payer: Medicare Other

## 2017-03-05 DIAGNOSIS — Z5181 Encounter for therapeutic drug level monitoring: Secondary | ICD-10-CM

## 2017-03-05 DIAGNOSIS — I480 Paroxysmal atrial fibrillation: Secondary | ICD-10-CM

## 2017-03-05 NOTE — Patient Instructions (Signed)
Pre visit review using our clinic review tool, if applicable. No additional management support is needed unless otherwise documented below in the visit note. 

## 2017-03-25 LAB — POCT INR: INR: 3

## 2017-03-26 ENCOUNTER — Ambulatory Visit (INDEPENDENT_AMBULATORY_CARE_PROVIDER_SITE_OTHER): Payer: Medicare Other

## 2017-03-26 DIAGNOSIS — Z5181 Encounter for therapeutic drug level monitoring: Secondary | ICD-10-CM

## 2017-03-26 DIAGNOSIS — I480 Paroxysmal atrial fibrillation: Secondary | ICD-10-CM

## 2017-03-26 NOTE — Patient Instructions (Signed)
Pre visit review using our clinic review tool, if applicable. No additional management support is needed unless otherwise documented below in the visit note. 

## 2017-03-29 ENCOUNTER — Encounter: Payer: Self-pay | Admitting: Internal Medicine

## 2017-03-29 LAB — PROTIME-INR

## 2017-04-22 ENCOUNTER — Encounter: Payer: Self-pay | Admitting: Internal Medicine

## 2017-04-22 ENCOUNTER — Non-Acute Institutional Stay: Payer: Medicare Other | Admitting: Internal Medicine

## 2017-04-22 VITALS — BP 112/64 | HR 68 | Wt 171.8 lb

## 2017-04-22 DIAGNOSIS — K5900 Constipation, unspecified: Secondary | ICD-10-CM | POA: Diagnosis not present

## 2017-04-22 DIAGNOSIS — M159 Polyosteoarthritis, unspecified: Secondary | ICD-10-CM

## 2017-04-22 DIAGNOSIS — N183 Chronic kidney disease, stage 3 unspecified: Secondary | ICD-10-CM

## 2017-04-22 DIAGNOSIS — I5032 Chronic diastolic (congestive) heart failure: Secondary | ICD-10-CM | POA: Diagnosis not present

## 2017-04-22 DIAGNOSIS — M150 Primary generalized (osteo)arthritis: Secondary | ICD-10-CM

## 2017-04-22 DIAGNOSIS — I480 Paroxysmal atrial fibrillation: Secondary | ICD-10-CM

## 2017-04-22 NOTE — Progress Notes (Signed)
Subjective:    Patient ID: [name], male    DOB: 08-Jun-1929, 83 y.o.   MRN: [id number]  HPI Visit for follow up of chronic health conditions Reviewed status with Luellen Pucker RN---no new concerns  No palpitations No chest pain No SOB Does do some exercise--fitness center and here No edema Some knee pain with activity--but no leg pain  Ongoing constipation miralax and colace daily. Senna prn Is able to manage No blood or pain with stools  Left shoulder is better Some pain in the morning--but it works out No meds for his arthritis  Current Outpatient Prescriptions on File Prior to Visit  Medication Sig Dispense Refill  . cetirizine (ZYRTEC) 10 MG tablet Take 10 mg by mouth daily.     . cholecalciferol (VITAMIN D) 1000 UNITS tablet Take 1,000 Units by mouth every morning. Reported on 03/19/2016    . cyanocobalamin (,VITAMIN B-12,) 1000 MCG/ML injection Inject 1,000 mcg into the muscle every 14 (fourteen) days.     . furosemide (LASIX) 20 MG tablet TAKE 1 TABLET BY MOUTH DAILY. (Patient taking differently: TAKE 1 TABLET BY MOUTH EVERY OTHER DAY) 30 tablet 11  . Glucosamine-Chondroit-Vit C-Mn (GLUCOSAMINE CHONDR 500 COMPLEX) CAPS Take 2 capsules by mouth daily.     . polyethylene glycol (MIRALAX / GLYCOLAX) packet Take 17 g by mouth daily.    Marland Kitchen senna-docusate (SENOKOT-S) 8.6-50 MG tablet Take 2 tablets by mouth daily. (Patient taking differently: Take 2 tablets by mouth daily as needed. ) 60 tablet 11  . sodium chloride (OCEAN) 0.65 % SOLN nasal spray Place 1 spray into both nostrils as needed for congestion.    Marland Kitchen warfarin (COUMADIN) 5 MG tablet TAKE AS DIRECTED BY COUMADIN CLINIC 30 tablet 3   No current facility-administered medications on file prior to visit.     Allergies  Allergen Reactions  . Statins     Muscle pains  . Latex Rash    bandaids--no other latex problems    Past Medical History:  Diagnosis Date  . AAA (abdominal aortic aneurysm) without rupture ([place])  03/03/2013   Small, partially thrombosed saccular aneurysm  . Arthritis    HANDS  . Atrial fibrillation ([place]) 02/10/2013  . BPH (benign prostatic hypertrophy)   . Cancer ([place])    SKIN CANCERS  . Chronic diastolic congestive heart failure ([place]) 03/20/2013  . Chronic kidney disease, stage III (moderate)   . Constipation   . Erectile dysfunction   . Hearing loss    BILATERAL - WEARS HEARING AIDS  . History of torn meniscus of left knee    PAINFUL LEFT KNEE  . Hyperlipidemia    statin intolerance  . Insomnia   . Mitral valve prolapse   . Mitral valve regurgitation   . OSA on CPAP   . S/P Maze operation for atrial fibrillation 03/15/2013   Complete bilateral lesion set using cryothermy and bipolar radiofrequency ablation with oversewing of LA appendage via right mini thoracotomy  . Spinal stenosis, thoracic 03/03/2013   T5-6    Past Surgical History:  Procedure Laterality Date  . CARPAL TUNNEL RELEASE     BILATERAL  . [place]  . INTRAOPERATIVE TRANSESOPHAGEAL ECHOCARDIOGRAM Right 03/15/2013   Procedure: INTRAOPERATIVE TRANSESOPHAGEAL ECHOCARDIOGRAM;  Surgeon: Rexene Alberts, MD;  Location: [facility];  Service: Open Heart Surgery;  Laterality: Right;  . MINIMALLY INVASIVE MAZE PROCEDURE Right 03/15/2013   Procedure: MINIMALLY INVASIVE MAZE PROCEDURE;  Surgeon: Rexene Alberts, MD;  Location:  [facility];  Service: Open Heart Surgery;  Laterality: Right;  (R) AXILLARY CANNULATION  . MITRAL VALVE REPAIR Right 03/15/2013   Procedure: MINIMALLY INVASIVE MITRAL VALVE REPAIR (MVR);  Surgeon: Rexene Alberts, MD;  Location: [facility];  Service: Open Heart Surgery;  Laterality: Right;  . RIGHT ROTATOR CUFF REPAIR    . SKIN CANCERS REMOVED FROM BOTH LEGS    . SKIN CANCERS REMOVED FROM LEFT FOREHEAD AND LEFT NOSE    . SURGERY LEFT NECK FOR REMOVAL OF METASTATIC LYMPH NODE ( FROM CANCER LEFT FOREHEAD)  AND LEFT NECK DISSECTION-REST OF LYMPH NODES NEGATIVE FOR ANY CANCER    . TEE WITHOUT  CARDIOVERSION N/A 01/31/2013   Procedure: TRANSESOPHAGEAL ECHOCARDIOGRAM (TEE);  Surgeon: Sueanne Margarita, MD;  Location: [facility] ENDOSCOPY;  Service: Cardiovascular;  Laterality: N/A;    Family History  Problem Relation Age of Onset  . Coronary artery disease Father   . Heart disease Father   . Heart attack Father   . Diabetes Mother   . Arthritis Mother   . Congestive Heart Failure Mother   . Alzheimer's disease Mother   . Alzheimer's disease Sister     Social History   Social History  . Marital status: Widowed    Spouse name: N/A  . Number of children: 2  . Years of education: N/A   Occupational History  . retired Art gallery manager    Social History Main Topics  . Smoking status: Former Smoker    Packs/day: 1.00    Years: 20.00    Start date: 11/23/1960  . Smokeless tobacco: Never Used     Comment: [place]  . Alcohol use 4.2 oz/week    7 Standard drinks or equivalent per week     Comment: Cocktail or glass of wine with dinner  . Drug use: No  . Sexual activity: Not on file   Other Topics Concern  . Not on file   Social History Narrative   Has living will   Daughter [name] is health care POA   Would accept resuscitation attempts but no prolonged ventilation   Not sure about tube feeds   Review of Systems No regular back problems Sleeps well in general---still uses CPAP every night Allergies seem to be controlled on the cetirizine Weight down slightly Voids okay Easy bruising--- continues on the coumadin. Bruises occur without known trauma on skin (superficial though)     Objective:   Physical Exam  Constitutional: He appears well-nourished. No distress.  Neck: No thyromegaly present.  Cardiovascular: Normal rate, regular rhythm and normal heart sounds.  Exam reveals no gallop.   No murmur heard. Pulmonary/Chest: Effort normal and breath sounds normal. No respiratory distress. He has no wheezes. He has no rales.  Abdominal: Soft. There is no  tenderness.  Musculoskeletal:  Trace ankle edema  Lymphadenopathy:    He has no cervical adenopathy.  Skin:  Multiple ecchymoses on arms/legs  Psychiatric: He has a normal mood and affect. His behavior is normal.          Assessment & Plan:

## 2017-04-22 NOTE — Assessment & Plan Note (Signed)
Regular today On coumadin

## 2017-04-22 NOTE — Assessment & Plan Note (Signed)
Stable creatinine on last check

## 2017-04-22 NOTE — Assessment & Plan Note (Signed)
Compensated Stays active and no change in exercise tolerance

## 2017-04-22 NOTE — Assessment & Plan Note (Signed)
Ongoing symptoms but he manages it with the meds

## 2017-04-22 NOTE — Assessment & Plan Note (Signed)
Mild persistent symptoms No regular meds

## 2017-04-26 ENCOUNTER — Ambulatory Visit (INDEPENDENT_AMBULATORY_CARE_PROVIDER_SITE_OTHER): Payer: Medicare Other

## 2017-04-26 DIAGNOSIS — I480 Paroxysmal atrial fibrillation: Secondary | ICD-10-CM

## 2017-04-26 DIAGNOSIS — Z5181 Encounter for therapeutic drug level monitoring: Secondary | ICD-10-CM

## 2017-04-26 LAB — POCT INR: INR: 3

## 2017-04-26 NOTE — Patient Instructions (Signed)
Pre visit review using our clinic review tool, if applicable. No additional management support is needed unless otherwise documented below in the visit note. 

## 2017-05-12 ENCOUNTER — Non-Acute Institutional Stay: Payer: Medicare Other | Admitting: Internal Medicine

## 2017-05-12 ENCOUNTER — Encounter: Payer: Self-pay | Admitting: Internal Medicine

## 2017-05-12 VITALS — BP 122/72 | HR 68

## 2017-05-12 DIAGNOSIS — L989 Disorder of the skin and subcutaneous tissue, unspecified: Secondary | ICD-10-CM

## 2017-05-12 NOTE — Progress Notes (Signed)
Subjective:    Patient ID: [name], male    DOB: July 27, 1929, 83 y.o.   MRN: [id number]  HPI  Asked to see pt in ALF Resident c/o skin lesion on left upper chest He noticed this 3 weeks ago Lesion is growing in size No drainage from the area Tender only when pressure applied to the area He has not tried anything OTC for this He has a derm appt 06/08/17  Review of Systems      Past Medical History:  Diagnosis Date  . AAA (abdominal aortic aneurysm) without rupture ([place]) 03/03/2013   Small, partially thrombosed saccular aneurysm  . Arthritis    HANDS  . Atrial fibrillation ([place]) 02/10/2013  . BPH (benign prostatic hypertrophy)   . Cancer ([place])    SKIN CANCERS  . Chronic diastolic congestive heart failure ([place]) 03/20/2013  . Chronic kidney disease, stage III (moderate)   . Constipation   . Erectile dysfunction   . Hearing loss    BILATERAL - WEARS HEARING AIDS  . History of torn meniscus of left knee    PAINFUL LEFT KNEE  . Hyperlipidemia    statin intolerance  . Insomnia   . Mitral valve prolapse   . Mitral valve regurgitation   . OSA on CPAP   . S/P Maze operation for atrial fibrillation 03/15/2013   Complete bilateral lesion set using cryothermy and bipolar radiofrequency ablation with oversewing of LA appendage via right mini thoracotomy  . Spinal stenosis, thoracic 03/03/2013   T5-6    Current Outpatient Prescriptions  Medication Sig Dispense Refill  . cetirizine (ZYRTEC) 10 MG tablet Take 10 mg by mouth daily.     . cholecalciferol (VITAMIN D) 1000 UNITS tablet Take 1,000 Units by mouth every morning. Reported on 03/19/2016    . cyanocobalamin (,VITAMIN B-12,) 1000 MCG/ML injection Inject 1,000 mcg into the muscle every 14 (fourteen) days.     Marland Kitchen docusate sodium (COLACE) 100 MG capsule Take 100 mg by mouth 2 (two) times daily.    . furosemide (LASIX) 20 MG tablet TAKE 1 TABLET BY MOUTH DAILY. (Patient taking differently: TAKE 1 TABLET BY MOUTH EVERY OTHER  DAY) 30 tablet 11  . Glucosamine-Chondroit-Vit C-Mn (GLUCOSAMINE CHONDR 500 COMPLEX) CAPS Take 2 capsules by mouth daily.     . polyethylene glycol (MIRALAX / GLYCOLAX) packet Take 17 g by mouth daily.    Marland Kitchen senna-docusate (SENOKOT-S) 8.6-50 MG tablet Take 2 tablets by mouth daily. (Patient taking differently: Take 2 tablets by mouth daily as needed. ) 60 tablet 11  . sodium chloride (OCEAN) 0.65 % SOLN nasal spray Place 1 spray into both nostrils as needed for congestion.    Marland Kitchen warfarin (COUMADIN) 5 MG tablet TAKE AS DIRECTED BY COUMADIN CLINIC 30 tablet 3   No current facility-administered medications for this visit.     Allergies  Allergen Reactions  . Statins     Muscle pains  . Latex Rash    bandaids--no other latex problems    Family History  Problem Relation Age of Onset  . Coronary artery disease Father   . Heart disease Father   . Heart attack Father   . Diabetes Mother   . Arthritis Mother   . Congestive Heart Failure Mother   . Alzheimer's disease Mother   . Alzheimer's disease Sister     Social History   Social History  . Marital status: Widowed    Spouse name: N/A  . Number of children: 2  .  Years of education: N/A   Occupational History  . retired Art gallery manager    Social History Main Topics  . Smoking status: Former Smoker    Packs/day: 1.00    Years: 20.00    Start date: 11/23/1960  . Smokeless tobacco: Never Used     Comment: [place]  . Alcohol use 4.2 oz/week    7 Standard drinks or equivalent per week     Comment: Cocktail or glass of wine with dinner  . Drug use: No  . Sexual activity: Not on file   Other Topics Concern  . Not on file   Social History Narrative   Has living will   Daughter [name] is health care POA   Would accept resuscitation attempts but no prolonged ventilation   Not sure about tube feeds     Constitutional: Denies fever, malaise, fatigue, headache or abrupt weight changes.  Skin: Pt reports skin lesion  of chest. Denies redness, rashes, lesions or ulcercations.    No other specific complaints in a complete review of systems (except as listed in HPI above).  Objective:   Physical Exam    BP 122/72   Pulse 68  Wt Readings from Last 3 Encounters:  04/22/17 171 lb 12.8 oz (77.9 kg)  01/14/17 175 lb (79.4 kg)  12/15/16 174 lb 9.6 oz (79.2 kg)    General: Appears his stated age, well developed, well nourished in NAD. Skin: 0.5 cm round, raised with central scaling, lesion noted of left upper chest.  BMET    Component Value Date/Time   NA 137 10/15/2015 1050   K 4.7 10/15/2015 1050   CL 102 10/15/2015 1050   CO2 26 10/15/2015 1050   GLUCOSE 71 10/15/2015 1050   BUN 25 10/15/2015 1050   CREATININE 1.71 (H) 10/15/2015 1050   CALCIUM 9.2 10/15/2015 1050   GFRNONAA 44 (L) 05/01/2014 1822   GFRAA 52 (L) 05/01/2014 1822    Lipid Panel  No results found for: CHOL, TRIG, HDL, CHOLHDL, VLDL, LDLCALC  CBC    Component Value Date/Time   WBC 9.2 05/01/2014 1822   RBC 4.24 05/01/2014 1822   HGB 12.9 (L) 05/01/2014 1822   HCT 39.2 05/01/2014 1822   PLT 215 05/01/2014 1822   MCV 92.5 05/01/2014 1822   MCH 30.4 05/01/2014 1822   MCHC 32.9 05/01/2014 1822   RDW 12.6 05/01/2014 1822    Hgb A1C Lab Results  Component Value Date   HGBA1C 5.7 (H) 03/13/2013          Assessment & Plan:   Skin Lesion of Chest Wall:  Concerning for a basal cell carcinoma Will have dermatology appt moved up, hopefully next week  Will reassess as needed Webb Silversmith, NP

## 2017-05-12 NOTE — Patient Instructions (Signed)
Basal Cell Carcinoma Basal cell carcinoma is the most common form of skin cancer. It begins in the basal cells, which are at the bottom of the outer skin layer (epidermis). It occurs most often on parts of the body that are frequently exposed to the sun, such as:  Parts of the head, including the scalp or face.  Ears.  Neck.  Arms or legs.  Backs of the hands.  Basal cell carcinoma can almost always be cured. It rarely spreads to other areas of the body (metastasizes). Basal cell carcinoma may come back at the same location (recur), but it can be treated again if this occurs. What are the causes? This condition is usually caused by exposure to ultraviolet (UV) light. UV light may come from the sun or from tanning beds. Other causes include:  Exposure to arsenic.  Exposure to radiation.  Exposure to toxic tars and oils.  Certain genetic conditions, such as xeroderma pigmentosum.  What increases the risk? This condition is more likely to develop in:  People who are older than 81 years of age.  People who have fair skin (light complexion).  People who have blonde or red hair.  People who have blue, green, or gray eyes.  People who have childhood freckling.  People who have had sun exposure over long periods of time, especially during childhood.  People who have had repeated sunburns.  People who have a weakened immune system.  People who have been exposed to certain chemicals, such as tar, soot, and arsenic.  People who have chronic inflammatory conditions.  People who have chronic infections.  People who use tanning beds.  What are the signs or symptoms? The main symptom of this condition is a growth or lesion on the skin. The shape and color of the growth or lesion may vary. The five main types include:  An open sore that may remain open for 3 weeks or longer. The sore may bleed or crust. This type of lesion can be an early sign of basal cell carcinoma. Basal  cell carcinoma often shows up as a sore that does not heal.  A reddish area that may crust, itch, or cause discomfort. This may occur on areas that are exposed to the sun. These patches might be easier to feel than to see.  A shiny or clear bump that is red, white, or pink. In people who have dark hair, the bump is often tan, black, or brown. These bumps can look like moles.  A pink growth with a raised border. The growth will have a crusted and indented area in the center. Small blood vessels may appear on the surface of the growth as it gets bigger.  A scarlike area that looks like shiny, stretched skin. The area may be white, yellow, or waxy. It often has irregular borders. This may be a sign of more aggressive basal cell carcinoma.  How is this diagnosed? This condition may be diagnosed with:  A physical exam.  Removal of a tissue sample to be examined under a microscope (biopsy).  How is this treated? Treatment for this condition involves removing the cancerous tissue. The method that is used for this depends on the type, size, location, and number of tumors. Possible treatments include:  Mohs surgery. In this procedure, the cancerous skin cells are removed layer by layer until all of the tumor has been removed.  Surgical removal (excision) of the tumor. This involves removing the entire tumor and a small amount of  normal skin that surrounds it.  Cryosurgery. This involves freezing the tumor with liquid nitrogen.  Plastic surgery. The tumor is removed, and healthy skin from another part of the body is used to cover the wound. This may be done for large tumors that are in areas where it is not possible to stretch the nearby skin to sew the edges of the wound together.  Radiation. This may be used for tumors on the face.  Photodynamic therapy. A chemical cream is applied to the skin, and light exposure is used to activate the chemical.  Electrodesiccation and curettage. This  involves alternately scraping and burning the tumor while using an electric current to control bleeding.  Chemical treatments, such as imiquimod cream and interferon injections. These may be used to remove superficial tumors with minimal scarring.  Follow these instructions at home:  Avoid unprotected sun exposure.  Do self-exams as told by your health care provider. Look for new spots or changes in your skin.  Keep all follow-up visits as told by your health care provider. This is important. How is this prevented?  Avoid the sun when it is the strongest. This is usually between 10:00 a.m. and 4:00 p.m.  When you are out in the sun, use a sunscreen that has a sun protection factor (SPF) of at least 31.  Apply sunscreen at least 30 minutes before exposure to the sun.  Reapply sunscreen every 2-4 hours while you are outside. Also reapply it after swimming and after excessive sweating.  Always wear hats, protective clothing, and UV-blocking sunglasses when you are outdoors.  Do not use tanning beds. Contact a health care provider if:  You notice any new spots or any changes in your skin.  You have had a basal cell carcinoma tumor removed and you notice a new growth in the same location. This information is not intended to replace advice given to you by your health care provider. Make sure you discuss any questions you have with your health care provider. Document Released: 05/16/2003 Document Revised: 04/16/2016 Document Reviewed: 03/04/2015 Elsevier Interactive Patient Education  [employer].

## 2017-05-24 LAB — POCT INR: INR: 2.7

## 2017-05-31 ENCOUNTER — Ambulatory Visit (INDEPENDENT_AMBULATORY_CARE_PROVIDER_SITE_OTHER): Payer: Medicare Other

## 2017-05-31 DIAGNOSIS — I480 Paroxysmal atrial fibrillation: Secondary | ICD-10-CM

## 2017-05-31 DIAGNOSIS — Z5181 Encounter for therapeutic drug level monitoring: Secondary | ICD-10-CM

## 2017-05-31 NOTE — Patient Instructions (Signed)
Pre visit review using our clinic review tool, if applicable. No additional management support is needed unless otherwise documented below in the visit note. 

## 2017-06-21 ENCOUNTER — Ambulatory Visit: Payer: Self-pay

## 2017-06-21 DIAGNOSIS — I480 Paroxysmal atrial fibrillation: Secondary | ICD-10-CM

## 2017-06-21 DIAGNOSIS — Z5181 Encounter for therapeutic drug level monitoring: Secondary | ICD-10-CM

## 2017-06-21 LAB — POCT INR: INR: 3

## 2017-07-09 ENCOUNTER — Emergency Department: Payer: Medicare Other

## 2017-07-09 ENCOUNTER — Emergency Department
Admission: EM | Admit: 2017-07-09 | Discharge: 2017-07-10 | Disposition: A | Discharge status: Home / Self Care | Payer: Medicare Other | Source: Home / Self Care | Attending: Student in an Organized Health Care Education/Training Program | Admitting: Student in an Organized Health Care Education/Training Program

## 2017-07-09 ENCOUNTER — Non-Acute Institutional Stay: Payer: Medicare Other | Admitting: Internal Medicine

## 2017-07-09 ENCOUNTER — Encounter: Payer: Self-pay | Admitting: Internal Medicine

## 2017-07-09 ENCOUNTER — Encounter: Payer: Self-pay | Admitting: Emergency Medicine

## 2017-07-09 VITALS — BP 126/64 | HR 62 | Temp 97.9°F | Resp 18 | Wt 175.0 lb

## 2017-07-09 DIAGNOSIS — Z7901 Long term (current) use of anticoagulants: Secondary | ICD-10-CM

## 2017-07-09 DIAGNOSIS — Z91040 Latex allergy status: Secondary | ICD-10-CM

## 2017-07-09 DIAGNOSIS — W1830XA Fall on same level, unspecified, initial encounter: Secondary | ICD-10-CM | POA: Insufficient documentation

## 2017-07-09 DIAGNOSIS — S51012A Laceration without foreign body of left elbow, initial encounter: Secondary | ICD-10-CM

## 2017-07-09 DIAGNOSIS — S41112A Laceration without foreign body of left upper arm, initial encounter: Secondary | ICD-10-CM | POA: Diagnosis not present

## 2017-07-09 DIAGNOSIS — Y999 Unspecified external cause status: Secondary | ICD-10-CM

## 2017-07-09 DIAGNOSIS — L03114 Cellulitis of left upper limb: Secondary | ICD-10-CM | POA: Insufficient documentation

## 2017-07-09 DIAGNOSIS — I714 Abdominal aortic aneurysm, without rupture, unspecified: Secondary | ICD-10-CM

## 2017-07-09 DIAGNOSIS — Y9239 Other specified sports and athletic area as the place of occurrence of the external cause: Secondary | ICD-10-CM

## 2017-07-09 DIAGNOSIS — M150 Primary generalized (osteo)arthritis: Secondary | ICD-10-CM | POA: Diagnosis not present

## 2017-07-09 DIAGNOSIS — M159 Polyosteoarthritis, unspecified: Secondary | ICD-10-CM

## 2017-07-09 DIAGNOSIS — I480 Paroxysmal atrial fibrillation: Secondary | ICD-10-CM

## 2017-07-09 DIAGNOSIS — I5032 Chronic diastolic (congestive) heart failure: Secondary | ICD-10-CM

## 2017-07-09 DIAGNOSIS — S50312A Abrasion of left elbow, initial encounter: Secondary | ICD-10-CM

## 2017-07-09 DIAGNOSIS — Z87891 Personal history of nicotine dependence: Secondary | ICD-10-CM | POA: Insufficient documentation

## 2017-07-09 DIAGNOSIS — Z79899 Other long term (current) drug therapy: Secondary | ICD-10-CM

## 2017-07-09 DIAGNOSIS — M79622 Pain in left upper arm: Secondary | ICD-10-CM | POA: Diagnosis not present

## 2017-07-09 DIAGNOSIS — N183 Chronic kidney disease, stage 3 unspecified: Secondary | ICD-10-CM

## 2017-07-09 DIAGNOSIS — Y9353 Activity, golf: Secondary | ICD-10-CM | POA: Insufficient documentation

## 2017-07-09 DIAGNOSIS — I5030 Unspecified diastolic (congestive) heart failure: Secondary | ICD-10-CM | POA: Insufficient documentation

## 2017-07-09 LAB — URINALYSIS, COMPLETE (UACMP) WITH MICROSCOPIC
Bacteria, UA: NONE SEEN
Bilirubin Urine: NEGATIVE
GLUCOSE, UA: NEGATIVE mg/dL
Hgb urine dipstick: NEGATIVE
Ketones, ur: NEGATIVE mg/dL
LEUKOCYTES UA: NEGATIVE
NITRITE: NEGATIVE
PROTEIN: NEGATIVE mg/dL
Specific Gravity, Urine: 1.02 (ref 1.005–1.030)
Squamous Epithelial / LPF: NONE SEEN
pH: 5 (ref 5.0–8.0)

## 2017-07-09 LAB — BASIC METABOLIC PANEL
ANION GAP: 6 (ref 5–15)
BUN: 22 mg/dL — ABNORMAL HIGH (ref 6–20)
CALCIUM: 9.1 mg/dL (ref 8.9–10.3)
CO2: 29 mmol/L (ref 22–32)
CREATININE: 1.53 mg/dL — AB (ref 0.61–1.24)
Chloride: 104 mmol/L (ref 101–111)
GFR, EST AFRICAN AMERICAN: 46 mL/min — AB (ref >60)
GFR, EST NON AFRICAN AMERICAN: 39 mL/min — AB (ref >60)
Glucose, Bld: 103 mg/dL — ABNORMAL HIGH (ref 65–99)
Potassium: 4.5 mmol/L (ref 3.5–5.1)
SODIUM: 139 mmol/L (ref 135–145)

## 2017-07-09 LAB — CBC
HCT: 37.9 % — ABNORMAL LOW (ref 40.0–52.0)
HEMOGLOBIN: 12.8 g/dL — AB (ref 13.0–18.0)
MCH: 30.3 pg (ref 26.0–34.0)
MCHC: 33.9 g/dL (ref 32.0–36.0)
MCV: 89.5 fL (ref 80.0–100.0)
PLATELETS: 207 10*3/uL (ref 150–440)
RBC: 4.23 MIL/uL — AB (ref 4.40–5.90)
RDW: 13.9 % (ref 11.5–14.5)
WBC: 7 10*3/uL (ref 3.8–10.6)

## 2017-07-09 LAB — PROTIME-INR
INR: 3.37
Prothrombin Time: 34.9 seconds — ABNORMAL HIGH (ref 11.4–15.2)

## 2017-07-09 LAB — GLUCOSE, CAPILLARY: Glucose-Capillary: 89 mg/dL (ref 65–99)

## 2017-07-09 LAB — POCT INR: INR: 3

## 2017-07-09 MED ORDER — CLINDAMYCIN PHOSPHATE 600 MG/50ML IV SOLN
600.0000 mg | Freq: Once | INTRAVENOUS | Status: AC
  Administered 2017-07-09: 600 mg via INTRAVENOUS
  Filled 2017-07-09 (×2): qty 50

## 2017-07-09 NOTE — Discharge Instructions (Signed)
Keep arm elevated.  Follow up with pcp.  Take abx as directed, return for worsening swelling, pain, fevers.

## 2017-07-09 NOTE — ED Notes (Signed)
Patient taken to chest xray

## 2017-07-09 NOTE — ED Triage Notes (Signed)
Pt ambulatory to triage with steady slow gait, no distress noted. Pt reports he had a syncopal episode on Wednesday while playing golf, injuring his left forearm with a skin tear. Per family member pt did not report episode until today and when seen at doctors office, pt was advised to come in for possible cellulitis to the left arm.

## 2017-07-09 NOTE — Assessment & Plan Note (Signed)
Asymptomatic  Monitor

## 2017-07-09 NOTE — ED Provider Notes (Addendum)
[facility] Emergency Department Provider Note    First MD Initiated Contact with Patient 07/09/17 2200     (approximate)  I have reviewed the triage vital signs and the nursing notes.   HISTORY  Chief Complaint Fall    HPI [name] is a 83 y.o. male presents with chief complaint of swelling and redness to the left upper extremity. This all started roughly 2-3 days ago after the patient states he was playing golf and fell onto sand trap injuring his left arm and elbow. Had a skin abrasion which is had associated swelling and then developed redness today with warmth. Denies any numbness or tingling distally. Patient went to urgent care where he is sent to the ER for further evaluation and management and concern for cellulitis. Patient did get 1 dose of Keflex today. No fevers. Does have some left chest wall pain where he fell. No abdominal pain. No head injury.   Past Medical History:  Diagnosis Date  . AAA (abdominal aortic aneurysm) without rupture ([place]) 03/03/2013   Small, partially thrombosed saccular aneurysm  . Arthritis    HANDS  . Atrial fibrillation ([place]) 02/10/2013  . Cancer ([place])    SKIN CANCERS  . Chronic diastolic congestive heart failure ([place]) 03/20/2013  . Chronic kidney disease, stage III (moderate)   . Constipation   . Erectile dysfunction   . Hearing loss    BILATERAL - WEARS HEARING AIDS  . History of torn meniscus of left knee    PAINFUL LEFT KNEE  . Mitral valve prolapse   . Mitral valve regurgitation   . OSA on CPAP   . S/P Maze operation for atrial fibrillation 03/15/2013   Complete bilateral lesion set using cryothermy and bipolar radiofrequency ablation with oversewing of LA appendage via right mini thoracotomy  . Spinal stenosis, thoracic 03/03/2013   T5-6   Family History  Problem Relation Age of Onset  . Coronary artery disease Father   . Heart disease Father   . Heart attack Father   . Diabetes Mother   .  Arthritis Mother   . Congestive Heart Failure Mother   . Alzheimer's disease Mother   . Alzheimer's disease Sister    Past Surgical History:  Procedure Laterality Date  . CARPAL TUNNEL RELEASE     BILATERAL  . [place]  . INTRAOPERATIVE TRANSESOPHAGEAL ECHOCARDIOGRAM Right 03/15/2013   Procedure: INTRAOPERATIVE TRANSESOPHAGEAL ECHOCARDIOGRAM;  Surgeon: Rexene Alberts, MD;  Location: [place];  Service: Open Heart Surgery;  Laterality: Right;  . MINIMALLY INVASIVE MAZE PROCEDURE Right 03/15/2013   Procedure: MINIMALLY INVASIVE MAZE PROCEDURE;  Surgeon: Rexene Alberts, MD;  Location: [place];  Service: Open Heart Surgery;  Laterality: Right;  (R) AXILLARY CANNULATION  . MITRAL VALVE REPAIR Right 03/15/2013   Procedure: MINIMALLY INVASIVE MITRAL VALVE REPAIR (MVR);  Surgeon: Rexene Alberts, MD;  Location: [place];  Service: Open Heart Surgery;  Laterality: Right;  . RIGHT ROTATOR CUFF REPAIR    . SKIN CANCERS REMOVED FROM BOTH LEGS    . SKIN CANCERS REMOVED FROM LEFT FOREHEAD AND LEFT NOSE    . SURGERY LEFT NECK FOR REMOVAL OF METASTATIC LYMPH NODE ( FROM CANCER LEFT FOREHEAD)  AND LEFT NECK DISSECTION-REST OF LYMPH NODES NEGATIVE FOR ANY CANCER    . TEE WITHOUT CARDIOVERSION N/A 01/31/2013   Procedure: TRANSESOPHAGEAL ECHOCARDIOGRAM (TEE);  Surgeon: Sueanne Margarita, MD;  Location: [place];  Service: Cardiovascular;  Laterality:  N/A;   Patient Active Problem List   Diagnosis Date Noted  . Osteoarthritis of left shoulder 11/19/2016  . Osteoarthrosis involving multiple sites 09/10/2016  . Constipation 06/11/2016  . Osteoarthritis of both knees 03/19/2016  . Chronic kidney disease, stage III (moderate)   . S/P MVR (mitral valve repair) 03/28/2015  . Encounter for therapeutic drug monitoring 12/26/2013  . OSA on CPAP   . Chronic diastolic congestive heart failure ([place]) 03/20/2013  . S/P Maze operation for atrial fibrillation 03/15/2013  . MR (mitral regurgitation)  03/03/2013  . PAF (paroxysmal atrial fibrillation) ([place]) 03/03/2013  . Spinal stenosis, thoracic 03/03/2013  . AAA (abdominal aortic aneurysm) without rupture ([place]) 03/03/2013  . Mitral valve prolapse, nonrheumatic 01/24/2013      Prior to Admission medications   Medication Sig Start Date End Date Taking? Authorizing Provider  cetirizine (ZYRTEC) 10 MG tablet Take 10 mg by mouth daily.     [provider]  cholecalciferol (VITAMIN D) 1000 UNITS tablet Take 1,000 Units by mouth every morning. Reported on 03/19/2016    [provider]  cyanocobalamin (,VITAMIN B-12,) 1000 MCG/ML injection Inject 1,000 mcg into the muscle every 14 (fourteen) days.     [provider]  docusate sodium (COLACE) 100 MG capsule Take 100 mg by mouth 2 (two) times daily.    [provider]  furosemide (LASIX) 20 MG tablet TAKE 1 TABLET BY MOUTH DAILY. Patient taking differently: TAKE 1 TABLET BY MOUTH EVERY OTHER DAY 11/15/15   Sueanne Margarita, MD  Glucosamine-Chondroit-Vit C-Mn (GLUCOSAMINE CHONDR 500 COMPLEX) CAPS Take 2 capsules by mouth daily.     [provider]  polyethylene glycol (MIRALAX / GLYCOLAX) packet Take 17 g by mouth daily.    [provider]  senna-docusate (SENOKOT-S) 8.6-50 MG tablet Take 2 tablets by mouth daily. Patient taking differently: Take 2 tablets by mouth daily as needed.  09/10/16   Venia Carbon, MD  sodium chloride (OCEAN) 0.65 % SOLN nasal spray Place 1 spray into both nostrils as needed for congestion.    [provider]  warfarin (COUMADIN) 5 MG tablet TAKE AS DIRECTED BY COUMADIN CLINIC 02/03/16   Sueanne Margarita, MD    Allergies Statins and Latex    Social History Social History  Substance Use Topics  . Smoking status: Former Smoker    Packs/day: 1.00    Years: 20.00    Start date: 11/23/1960  . Smokeless tobacco: Never Used     Comment: [place]  . Alcohol use 4.2 oz/week    7 Standard drinks  or equivalent per week     Comment: Cocktail or glass of wine with dinner    Review of Systems Patient denies headaches, rhinorrhea, blurry vision, numbness, shortness of breath, chest pain, edema, cough, abdominal pain, nausea, vomiting, diarrhea, dysuria, fevers, rashes or hallucinations unless otherwise stated above in HPI. ____________________________________________   PHYSICAL EXAM:  VITAL SIGNS: Vitals:   07/09/17 2028 07/09/17 2213  BP: (!) 144/79 (!) 156/85  Pulse: 73 77  Resp: 17 16  Temp: 98.6 F (37 C)   SpO2: 100% 99%    Constitutional: Alert and oriented. in no acute distress. Eyes: Conjunctivae are normal.  Head: Atraumatic. Nose: No congestion/rhinnorhea. Mouth/Throat: Mucous membranes are moist.   Neck: No stridor. Painless ROM.  Cardiovascular: Normal rate, regular rhythm. Grossly normal heart sounds.  Good peripheral circulation. Respiratory: Normal respiratory effort.  No retractions. Lungs CTAB. Gastrointestinal: Soft and nontender. No distention. No abdominal  bruits. No CVA tenderness. Musculoskeletal:ecchymosis and swelling to LUE.  Delayed cap refill to left hand which improved after removing tight bandage.  2+ radial and ulnar pulses.  + swelling to left hand.  SILT.  Superficial abrasion over elbow without effusion, crepitus or blistering.  Mild ttp of left chest wall without crepitus of ecchymosis Neurologic:  Normal speech and language. No gross focal neurologic deficits are appreciated. No facial droop Skin:  Abrasion as above Psychiatric: Mood and affect are normal. Speech and behavior are normal.  ____________________________________________   LABS (all labs ordered are listed, but only abnormal results are displayed)  Results for orders placed or performed during the hospital encounter of 07/09/17 (from the past 24 hour(s))  Basic metabolic panel     Status: Abnormal   Collection Time: 07/09/17  8:33 PM  Result Value Ref Range   Sodium 139  135 - 145 mmol/L   Potassium 4.5 3.5 - 5.1 mmol/L   Chloride 104 101 - 111 mmol/L   CO2 29 22 - 32 mmol/L   Glucose, Bld 103 (H) 65 - 99 mg/dL   BUN 22 (H) 6 - 20 mg/dL   Creatinine, Ser 1.53 (H) 0.61 - 1.24 mg/dL   Calcium 9.1 8.9 - 10.3 mg/dL   GFR calc non Af Amer 39 (L) >60 mL/min   GFR calc Af Amer 46 (L) >60 mL/min   Anion gap 6 5 - 15  CBC     Status: Abnormal   Collection Time: 07/09/17  8:33 PM  Result Value Ref Range   WBC 7.0 3.8 - 10.6 K/uL   RBC 4.23 (L) 4.40 - 5.90 MIL/uL   Hemoglobin 12.8 (L) 13.0 - 18.0 g/dL   HCT 37.9 (L) 40.0 - 52.0 %   MCV 89.5 80.0 - 100.0 fL   MCH 30.3 26.0 - 34.0 pg   MCHC 33.9 32.0 - 36.0 g/dL   RDW 13.9 11.5 - 14.5 %   Platelets 207 150 - 440 K/uL  Urinalysis, Complete w Microscopic     Status: Abnormal   Collection Time: 07/09/17  8:33 PM  Result Value Ref Range   Color, Urine YELLOW (A) YELLOW   APPearance CLEAR (A) CLEAR   Specific Gravity, Urine 1.020 1.005 - 1.030   pH 5.0 5.0 - 8.0   Glucose, UA NEGATIVE NEGATIVE mg/dL   Hgb urine dipstick NEGATIVE NEGATIVE   Bilirubin Urine NEGATIVE NEGATIVE   Ketones, ur NEGATIVE NEGATIVE mg/dL   Protein, ur NEGATIVE NEGATIVE mg/dL   Nitrite NEGATIVE NEGATIVE   Leukocytes, UA NEGATIVE NEGATIVE   RBC / HPF 0-5 0 - 5 RBC/hpf   WBC, UA 0-5 0 - 5 WBC/hpf   Bacteria, UA NONE SEEN NONE SEEN   Squamous Epithelial / LPF NONE SEEN NONE SEEN   Mucous PRESENT   Glucose, capillary     Status: None   Collection Time: 07/09/17  8:36 PM  Result Value Ref Range   Glucose-Capillary 89 65 - 99 mg/dL  Protime-INR     Status: Abnormal   Collection Time: 07/09/17 10:57 PM  Result Value Ref Range   Prothrombin Time 34.9 (H) 11.4 - 15.2 seconds   INR 3.37    ____________________________________________  EKG ED ECG REPORT I, [name], the attending physician, personally viewed and interpreted this ECG.   Date: 07/10/2017  EKG Time: 20:28  Rate: arm pain  Rhythm: normal EKG, normal sinus  rhythm, unchanged from previous tracings  Axis: normal  Intervals: normal  ST&T Change: no  stemi or depressions  _____________________________  RADIOLOGY  I personally reviewed all radiographic images ordered to evaluate for the above acute complaints and reviewed radiology reports and findings.  These findings were personally discussed with the patient.  Please see medical record for radiology report.  ____________________________________________   PROCEDURES  Procedure(s) performed:  Procedures    Critical Care performed: no ____________________________________________   INITIAL IMPRESSION / ASSESSMENT AND PLAN / ED COURSE  Pertinent labs & imaging results that were available during my care of the patient were reviewed by me and considered in my medical decision making (see chart for details).  DDX: cellulitis, ecchymosis, raynauds, limb ischemia  ALDRICK DERRIG is a 83 y.o. who presents to the ED with chief complaint of left upper extremity swelling and pain as described above. My initial concern was for limb ischemia but his perfusion improved after removal of the tightly fitted bandage and he has good strong radial and ulnar pulses. Does have some dependent edema and I do spend a large component of this redness and swelling is secondary to ecchymosis. No evidence of fracture. Patient will arrange elbow. Given his open wound and agree with continuing Keflex but will also give dose of clindamycin to help improve some and erythroderma. Patient is otherwise afebrile and hemodynamically stable. I do feel that he is appropriate for further outpatient management of his cellulitis.  Have discussed with the patient and available family all diagnostics and treatments performed thus far and all questions were answered to the best of my ability. The patient demonstrates understanding and agreement with plan.       ____________________________________________   FINAL CLINICAL  IMPRESSION(S) / ED DIAGNOSES  Final diagnoses:  Cellulitis of left upper extremity      NEW MEDICATIONS STARTED DURING THIS VISIT:  New Prescriptions   No medications on file     Note:  This document was prepared using Dragon voice recognition software and may include unintentional dictation errors.    Austin Lot, MD 07/09/17 9728    Austin Lot, MD 07/10/17 [phone]

## 2017-07-09 NOTE — Assessment & Plan Note (Signed)
Continue Coumadin. 

## 2017-07-09 NOTE — Progress Notes (Signed)
Subjective:    Patient ID: [name], male    DOB: [birth year], 83 y.o.   MRN: [id number]  HPI  Routine visit for pt in ALF 306 RN reports resident [place]. He sustained a large skin tear for his left forearm. The area was cleansed and covered with Mepilex. RN reports extensive bruising to left arm and left leg. Residents reports mild pain around skin tear. Otherwise, doing fairly well. Sleeps okay, appetite good, weight stable. He does have some constipation but controlled with Colace, Mirilax and prn Senna. He is voiding okay. Continues to walk without assistance.  AAA: CT Angio from 02/2013 reviewed. Asymptomatic at this time.  OA: Mainly in his hands. He remains active and takes Glucosamine daily. He has Tylenol prn.  Afib: He is taking Coumadin daily. He does not follow with cardiology.   CHF, Diastolic: Compensated. He denies lower extremity swelling or SOB. He takes Lasix as prescribed.   CKD 3: Last creatinine 1.54.   Review of Systems      Past Medical History:  Diagnosis Date  . AAA (abdominal aortic aneurysm) without rupture ([place]) 03/03/2013   Small, partially thrombosed saccular aneurysm  . Arthritis    HANDS  . Atrial fibrillation ([place]) 02/10/2013  . BPH (benign prostatic hypertrophy)   . Cancer ([place])    SKIN CANCERS  . Chronic diastolic congestive heart failure ([place]) 03/20/2013  . Chronic kidney disease, stage III (moderate)   . Constipation   . Erectile dysfunction   . Hearing loss    BILATERAL - WEARS HEARING AIDS  . History of torn meniscus of left knee    PAINFUL LEFT KNEE  . Hyperlipidemia    statin intolerance  . Insomnia   . Mitral valve prolapse   . Mitral valve regurgitation   . OSA on CPAP   . S/P Maze operation for atrial fibrillation 03/15/2013   Complete bilateral lesion set using cryothermy and bipolar radiofrequency ablation with oversewing of LA appendage via right mini thoracotomy  . Spinal  stenosis, thoracic 03/03/2013   T5-6    Current Outpatient Prescriptions  Medication Sig Dispense Refill  . cetirizine (ZYRTEC) 10 MG tablet Take 10 mg by mouth daily.     . cholecalciferol (VITAMIN D) 1000 UNITS tablet Take 1,000 Units by mouth every morning. Reported on 03/19/2016    . cyanocobalamin (,VITAMIN B-12,) 1000 MCG/ML injection Inject 1,000 mcg into the muscle every 14 (fourteen) days.     Marland Kitchen docusate sodium (COLACE) 100 MG capsule Take 100 mg by mouth 2 (two) times daily.    . furosemide (LASIX) 20 MG tablet TAKE 1 TABLET BY MOUTH DAILY. (Patient taking differently: TAKE 1 TABLET BY MOUTH EVERY OTHER DAY) 30 tablet 11  . Glucosamine-Chondroit-Vit C-Mn (GLUCOSAMINE CHONDR 500 COMPLEX) CAPS Take 2 capsules by mouth daily.     . polyethylene glycol (MIRALAX / GLYCOLAX) packet Take 17 g by mouth daily.    Marland Kitchen senna-docusate (SENOKOT-S) 8.6-50 MG tablet Take 2 tablets by mouth daily. (Patient taking differently: Take 2 tablets by mouth daily as needed. ) 60 tablet 11  . sodium chloride (OCEAN) 0.65 % SOLN nasal spray Place 1 spray into both nostrils as needed for congestion.    Marland Kitchen warfarin (COUMADIN) 5 MG tablet TAKE AS DIRECTED BY COUMADIN CLINIC 30 tablet 3   No current facility-administered medications for this visit.     Allergies  Allergen Reactions  . Statins  Muscle pains  . Latex Rash    bandaids--no other latex problems    Family History  Problem Relation Age of Onset  . Coronary artery disease Father   . Heart disease Father   . Heart attack Father   . Diabetes Mother   . Arthritis Mother   . Congestive Heart Failure Mother   . Alzheimer's disease Mother   . Alzheimer's disease Sister     Social History   Social History  . Marital status: Widowed    Spouse name: N/A  . Number of children: 2  . Years of education: N/A   Occupational History  . retired Art gallery manager    Social History Main Topics  . Smoking status: Former Smoker    Packs/day:  1.00    Years: 20.00    Start date: 11/23/1960  . Smokeless tobacco: Never Used     Comment: [place]  . Alcohol use 4.2 oz/week    7 Standard drinks or equivalent per week     Comment: Cocktail or glass of wine with dinner  . Drug use: No  . Sexual activity: Not on file   Other Topics Concern  . Not on file   Social History Narrative   Has living will   Daughter [name] is health care POA   Would accept resuscitation attempts but no prolonged ventilation   Not sure about tube feeds     Constitutional: Denies fever, malaise, fatigue, headache or abrupt weight changes.  HEENT: Denies eye pain, eye redness, ear pain, ringing in the ears, wax buildup, runny nose, nasal congestion, bloody nose, or sore throat. Respiratory: Denies difficulty breathing, shortness of breath, cough or sputum production.   Cardiovascular: Denies chest pain, chest tightness, palpitations or swelling in the hands or feet.  Gastrointestinal: Pt reports constipation. Denies abdominal pain, bloating, diarrhea or blood in the stool.  GU: Pt reports nocturia. Denies urgency, frequency, pain with urination, burning sensation, blood in urine, odor or discharge. Musculoskeletal: Denies decrease in range of motion, difficulty with gait, muscle pain or joint pain and swelling.  Skin: Pt reports skin tear to left forearm. Denies rashes, lesions or ulcercations.  Neurological: Denies dizziness, difficulty with memory, difficulty with speech or problems with balance and coordination.  Psych: Denies anxiety, depression, SI/HI.  No other specific complaints in a complete review of systems (except as listed in HPI above).  Objective:   Physical Exam   BP 126/64   Pulse 62   Temp 97.9 F (36.6 C)   Resp 18   Wt 175 lb (79.4 kg)   BMI 25.84 kg/m  Wt Readings from Last 3 Encounters:  07/09/17 175 lb (79.4 kg)  04/22/17 171 lb 12.8 oz (77.9 kg)  01/14/17 175 lb (79.4 kg)    General: Appears his stated age,  in NAD. Skin: Skin tear with fresh dressing. Left arm is swollen from fingers to mid bicep. Around around dressing is red and slightly warm. Bruising noted of left arm and left leg.  Neck:  Neck supple, trachea midline. No masses, lumps or thyromegaly present.  Cardiovascular: Normal rate and rhythm. S1,S2 noted.  No murmur, rubs or gallops noted. No JVD or BLE edema. No carotid bruits noted. Pulmonary/Chest: Normal effort and positive vesicular breath sounds. No respiratory distress. No wheezes, rales or ronchi noted.  Abdomen: Soft and nontender. Normal bowel sounds. No distention or masses noted. Musculoskeletal: No difficulty with gait.  Neurological: Alert and oriented. Psychiatric: Mood and affect normal. Behavior  is normal. Judgment and thought content normal.     BMET    Component Value Date/Time   NA 137 10/15/2015 1050   K 4.7 10/15/2015 1050   CL 102 10/15/2015 1050   CO2 26 10/15/2015 1050   GLUCOSE 71 10/15/2015 1050   BUN 25 10/15/2015 1050   CREATININE 1.71 (H) 10/15/2015 1050   CALCIUM 9.2 10/15/2015 1050   GFRNONAA 44 (L) 05/01/2014 1822   GFRAA 52 (L) 05/01/2014 1822    Lipid Panel  No results found for: CHOL, TRIG, HDL, CHOLHDL, VLDL, LDLCALC  CBC    Component Value Date/Time   WBC 9.2 05/01/2014 1822   RBC 4.24 05/01/2014 1822   HGB 12.9 (L) 05/01/2014 1822   HCT 39.2 05/01/2014 1822   PLT 215 05/01/2014 1822   MCV 92.5 05/01/2014 1822   MCH 30.4 05/01/2014 1822   MCHC 32.9 05/01/2014 1822   RDW 12.6 05/01/2014 1822    Hgb A1C Lab Results  Component Value Date   HGBA1C 5.7 (H) 03/13/2013           Assessment & Plan:   Skin Tear of Left Arm:  Will treat prophylactically with Keflex 250 mg TID x 7 days Treat wound per standing orders  Will reassess as needed Webb Silversmith, NP

## 2017-07-09 NOTE — Assessment & Plan Note (Signed)
Compensated Continue Lasix

## 2017-07-09 NOTE — Assessment & Plan Note (Signed)
Will monitor yearly creatinine

## 2017-07-09 NOTE — Patient Instructions (Signed)
Pre visit review using our clinic review tool, if applicable. No additional management support is needed unless otherwise documented below in the visit note. 

## 2017-07-09 NOTE — Assessment & Plan Note (Signed)
Continue Glucosamine Encouraged him to stay active Tylenol prn

## 2017-07-11 ENCOUNTER — Encounter: Payer: Self-pay | Admitting: Emergency Medicine

## 2017-07-11 ENCOUNTER — Inpatient Hospital Stay
Admission: EM | Admit: 2017-07-11 | Discharge: 2017-07-13 | DRG: 603 | Disposition: A | Discharge status: Nursing Home (Includes ICF) | Payer: Medicare Other | Attending: Internal Medicine | Admitting: Internal Medicine

## 2017-07-11 DIAGNOSIS — S50312A Abrasion of left elbow, initial encounter: Secondary | ICD-10-CM | POA: Diagnosis present

## 2017-07-11 DIAGNOSIS — L0390 Cellulitis, unspecified: Secondary | ICD-10-CM | POA: Diagnosis present

## 2017-07-11 DIAGNOSIS — L03114 Cellulitis of left upper limb: Principal | ICD-10-CM

## 2017-07-11 DIAGNOSIS — Z888 Allergy status to other drugs, medicaments and biological substances status: Secondary | ICD-10-CM

## 2017-07-11 DIAGNOSIS — I714 Abdominal aortic aneurysm, without rupture: Secondary | ICD-10-CM | POA: Diagnosis present

## 2017-07-11 DIAGNOSIS — W1839XA Other fall on same level, initial encounter: Secondary | ICD-10-CM | POA: Diagnosis present

## 2017-07-11 DIAGNOSIS — I482 Chronic atrial fibrillation: Secondary | ICD-10-CM | POA: Diagnosis present

## 2017-07-11 DIAGNOSIS — G4733 Obstructive sleep apnea (adult) (pediatric): Secondary | ICD-10-CM | POA: Diagnosis present

## 2017-07-11 DIAGNOSIS — Z8249 Family history of ischemic heart disease and other diseases of the circulatory system: Secondary | ICD-10-CM

## 2017-07-11 DIAGNOSIS — Z952 Presence of prosthetic heart valve: Secondary | ICD-10-CM

## 2017-07-11 DIAGNOSIS — Z833 Family history of diabetes mellitus: Secondary | ICD-10-CM

## 2017-07-11 DIAGNOSIS — K5909 Other constipation: Secondary | ICD-10-CM | POA: Diagnosis present

## 2017-07-11 DIAGNOSIS — N189 Chronic kidney disease, unspecified: Secondary | ICD-10-CM

## 2017-07-11 DIAGNOSIS — Z974 Presence of external hearing-aid: Secondary | ICD-10-CM

## 2017-07-11 DIAGNOSIS — Z87891 Personal history of nicotine dependence: Secondary | ICD-10-CM

## 2017-07-11 DIAGNOSIS — Y9239 Other specified sports and athletic area as the place of occurrence of the external cause: Secondary | ICD-10-CM

## 2017-07-11 DIAGNOSIS — Z79899 Other long term (current) drug therapy: Secondary | ICD-10-CM

## 2017-07-11 DIAGNOSIS — Z85828 Personal history of other malignant neoplasm of skin: Secondary | ICD-10-CM

## 2017-07-11 DIAGNOSIS — Z91040 Latex allergy status: Secondary | ICD-10-CM

## 2017-07-11 DIAGNOSIS — Z7901 Long term (current) use of anticoagulants: Secondary | ICD-10-CM

## 2017-07-11 DIAGNOSIS — N183 Chronic kidney disease, stage 3 (moderate): Secondary | ICD-10-CM | POA: Diagnosis present

## 2017-07-11 DIAGNOSIS — H9190 Unspecified hearing loss, unspecified ear: Secondary | ICD-10-CM | POA: Diagnosis present

## 2017-07-11 DIAGNOSIS — I5032 Chronic diastolic (congestive) heart failure: Secondary | ICD-10-CM | POA: Diagnosis present

## 2017-07-11 LAB — BASIC METABOLIC PANEL
Anion gap: 9 (ref 5–15)
BUN: 24 mg/dL — AB (ref 6–20)
CHLORIDE: 102 mmol/L (ref 101–111)
CO2: 28 mmol/L (ref 22–32)
CREATININE: 1.68 mg/dL — AB (ref 0.61–1.24)
Calcium: 9.6 mg/dL (ref 8.9–10.3)
GFR calc non Af Amer: 35 mL/min — ABNORMAL LOW (ref >60)
GFR, EST AFRICAN AMERICAN: 41 mL/min — AB (ref >60)
Glucose, Bld: 95 mg/dL (ref 65–99)
Potassium: 4.3 mmol/L (ref 3.5–5.1)
SODIUM: 139 mmol/L (ref 135–145)

## 2017-07-11 LAB — CBC WITH DIFFERENTIAL/PLATELET
BASOS PCT: 1 %
Basophils Absolute: 0.1 10*3/uL (ref 0–0.1)
EOS ABS: 0.3 10*3/uL (ref 0–0.7)
EOS PCT: 5 %
HCT: 39.7 % — ABNORMAL LOW (ref 40.0–52.0)
HEMOGLOBIN: 13.4 g/dL (ref 13.0–18.0)
Lymphocytes Relative: 19 %
Lymphs Abs: 1.2 10*3/uL (ref 1.0–3.6)
MCH: 30.6 pg (ref 26.0–34.0)
MCHC: 33.7 g/dL (ref 32.0–36.0)
MCV: 90.7 fL (ref 80.0–100.0)
MONOS PCT: 12 %
Monocytes Absolute: 0.7 10*3/uL (ref 0.2–1.0)
NEUTROS PCT: 63 %
Neutro Abs: 4.2 10*3/uL (ref 1.4–6.5)
PLATELETS: 220 10*3/uL (ref 150–440)
RBC: 4.37 MIL/uL — ABNORMAL LOW (ref 4.40–5.90)
RDW: 13.9 % (ref 11.5–14.5)
WBC: 6.5 10*3/uL (ref 3.8–10.6)

## 2017-07-11 LAB — PROTIME-INR
INR: 3.07
PROTHROMBIN TIME: 32.4 s — AB (ref 11.4–15.2)

## 2017-07-11 MED ORDER — BISACODYL 5 MG PO TBEC
5.0000 mg | DELAYED_RELEASE_TABLET | Freq: Every day | ORAL | Status: DC | PRN
  Administered 2017-07-11: 5 mg via ORAL
  Filled 2017-07-11: qty 1

## 2017-07-11 MED ORDER — WARFARIN SODIUM 5 MG PO TABS: 5.0000 mg | ORAL_TABLET | ORAL | Status: DC

## 2017-07-11 MED ORDER — DOCUSATE SODIUM 100 MG PO CAPS
100.0000 mg | ORAL_CAPSULE | Freq: Two times a day (BID) | ORAL | Status: DC
  Administered 2017-07-11 – 2017-07-12 (×3): 100 mg via ORAL
  Filled 2017-07-11 (×3): qty 1

## 2017-07-11 MED ORDER — WARFARIN SODIUM 2.5 MG PO TABS: 2.5000 mg | ORAL_TABLET | ORAL | Status: DC

## 2017-07-11 MED ORDER — VANCOMYCIN HCL IN DEXTROSE 1-5 GM/200ML-% IV SOLN: 1000.0000 mg | Freq: Once | INTRAVENOUS | Status: DC

## 2017-07-11 MED ORDER — SENNOSIDES-DOCUSATE SODIUM 8.6-50 MG PO TABS
2.0000 | ORAL_TABLET | Freq: Every day | ORAL | Status: DC
  Administered 2017-07-12: 2 via ORAL
  Filled 2017-07-11: qty 2

## 2017-07-11 MED ORDER — IPRATROPIUM BROMIDE 0.02 % IN SOLN: 0.5000 mg | Freq: Four times a day (QID) | RESPIRATORY_TRACT | Status: DC | PRN

## 2017-07-11 MED ORDER — ALBUTEROL SULFATE (2.5 MG/3ML) 0.083% IN NEBU: 2.5000 mg | INHALATION_SOLUTION | Freq: Four times a day (QID) | RESPIRATORY_TRACT | Status: DC | PRN

## 2017-07-11 MED ORDER — WARFARIN SODIUM 5 MG PO TABS: 5.0000 mg | ORAL_TABLET | Freq: Once | ORAL | Status: DC

## 2017-07-11 MED ORDER — VANCOMYCIN HCL IN DEXTROSE 1-5 GM/200ML-% IV SOLN
1000.0000 mg | Freq: Once | INTRAVENOUS | Status: AC
  Administered 2017-07-11: 1000 mg via INTRAVENOUS
  Filled 2017-07-11: qty 200

## 2017-07-11 MED ORDER — OXYCODONE HCL 5 MG PO TABS
5.0000 mg | ORAL_TABLET | ORAL | Status: DC | PRN
  Administered 2017-07-12: 5 mg via ORAL
  Filled 2017-07-11: qty 1

## 2017-07-11 MED ORDER — POLYETHYLENE GLYCOL 3350 17 G PO PACK
17.0000 g | PACK | Freq: Every day | ORAL | Status: DC
  Administered 2017-07-12: 17 g via ORAL
  Filled 2017-07-11: qty 1

## 2017-07-11 MED ORDER — SENNOSIDES-DOCUSATE SODIUM 8.6-50 MG PO TABS
1.0000 | ORAL_TABLET | Freq: Every evening | ORAL | Status: DC | PRN
  Administered 2017-07-11: 1 via ORAL
  Filled 2017-07-11: qty 1

## 2017-07-11 MED ORDER — WARFARIN - PHARMACIST DOSING INPATIENT
Freq: Every day | Status: DC
  Administered 2017-07-12: 18:00:00

## 2017-07-11 MED ORDER — SALINE SPRAY 0.65 % NA SOLN
1.0000 | NASAL | Status: DC | PRN
  Filled 2017-07-11: qty 44

## 2017-07-11 MED ORDER — MAGNESIUM CITRATE PO SOLN: 1.0000 | Freq: Once | ORAL | Status: DC | PRN

## 2017-07-11 MED ORDER — SODIUM CHLORIDE 0.9% FLUSH: 3.0000 mL | Freq: Two times a day (BID) | INTRAVENOUS | Status: DC

## 2017-07-11 MED ORDER — FUROSEMIDE 20 MG PO TABS
20.0000 mg | ORAL_TABLET | ORAL | Status: DC
  Administered 2017-07-12: 20 mg via ORAL
  Filled 2017-07-11: qty 1

## 2017-07-11 MED ORDER — SODIUM CHLORIDE 0.9% FLUSH
3.0000 mL | Freq: Two times a day (BID) | INTRAVENOUS | Status: DC
  Administered 2017-07-12 (×2): 3 mL via INTRAVENOUS

## 2017-07-11 MED ORDER — SODIUM CHLORIDE 0.9% FLUSH: 3.0000 mL | INTRAVENOUS | Status: DC | PRN

## 2017-07-11 MED ORDER — VITAMIN D 1000 UNITS PO TABS
1000.0000 [IU] | ORAL_TABLET | Freq: Every morning | ORAL | Status: DC
  Administered 2017-07-12 – 2017-07-13 (×2): 1000 [IU] via ORAL
  Filled 2017-07-11 (×2): qty 1

## 2017-07-11 MED ORDER — LORATADINE 10 MG PO TABS
10.0000 mg | ORAL_TABLET | Freq: Every day | ORAL | Status: DC
  Administered 2017-07-12 – 2017-07-13 (×2): 10 mg via ORAL
  Filled 2017-07-11 (×2): qty 1

## 2017-07-11 MED ORDER — ACETAMINOPHEN 325 MG PO TABS: 650.0000 mg | ORAL_TABLET | Freq: Four times a day (QID) | ORAL | Status: DC | PRN

## 2017-07-11 MED ORDER — ONDANSETRON HCL 4 MG PO TABS: 4.0000 mg | ORAL_TABLET | Freq: Four times a day (QID) | ORAL | Status: DC | PRN

## 2017-07-11 MED ORDER — ACETAMINOPHEN 650 MG RE SUPP: 650.0000 mg | Freq: Four times a day (QID) | RECTAL | Status: DC | PRN

## 2017-07-11 MED ORDER — SODIUM CHLORIDE 0.9 % IV SOLN: 250.0000 mL | INTRAVENOUS | Status: DC | PRN

## 2017-07-11 MED ORDER — ONDANSETRON HCL 4 MG/2ML IJ SOLN: 4.0000 mg | Freq: Four times a day (QID) | INTRAMUSCULAR | Status: DC | PRN

## 2017-07-11 MED ORDER — GLUCOSAMINE CHONDR 500 COMPLEX PO CAPS: 2.0000 | ORAL_CAPSULE | Freq: Every day | ORAL | Status: DC

## 2017-07-11 NOTE — H&P (Signed)
History and Physical   [facility] - [place] @ [facility] Admission History and Physical [employer], D.O.    Patient Name: [name] MR#: [id number] Date of Birth: June 04, 1929 Date of Admission: 07/11/2017  Referring MD/NP/PA: Dr. Joni Fears Primary Care Physician: Venia Carbon, MD  Chief Complaint:  Chief Complaint  Patient presents with  . Cellulitis    HPI: [name] is a 83 y.o. male with a known history of atrial fibrillation, chronic diastolic congestive heart failure, chronic kidney disease stage III, chronic constipation, hearing loss, obstructive sleep apnea presents to the emergency department for evaluation of left arm.  Patient was in a usual state of health until 5 days ago when he was golfing and he suddenly became lightheaded dizzy and weak, fell backwards landing on his left elbow. He recovered spontaneously and never lost consciousness, no head trauma however he did have a significant abrasion to his left elbow and upper extremity. He was seen by the nursing staff at his assisted living who started antibiotics but 4 days ago he presented to the emergency department where the wound was cleaned and he received a dose of IV clindamycin and PO Keflex and was discharged home. He reports that over the past 4 days his symptoms have worsened and now include pain swelling and redness over the left lateral arm associated with painful range of motion..  Of note patient and his daughter believed that his fall 5 days ago was related to dehydration. He also complains of acute on chronic constipation requiring daily MiraLAX.  At this time patient denies fevers/chills, weakness, dizziness, chest pain, shortness of breath, N/V/C/D, abdominal pain, dysuria/frequency, changes in mental status.    Otherwise there has been no change in status. Patient has been taking medication as prescribed and there has been no recent change in medication or diet.  No recent antibiotics.   There has been no recent illness, hospitalizations, travel or sick contacts.    EMS/ED Course: Patient received [place]. Medical admission has been requested for further management of cellulitis failed outpatient therapy.  Review of Systems:  CONSTITUTIONAL: No fever/chills, fatigue, weakness, weight gain/loss, headache. EYES: No blurry or double vision. ENT: No tinnitus, postnasal drip, redness or soreness of the oropharynx. RESPIRATORY: No cough, dyspnea, wheeze.  No hemoptysis.  CARDIOVASCULAR: No chest pain, palpitations, syncope, orthopnea. No lower extremity edema.  GASTROINTESTINAL: No nausea, vomiting, abdominal pain, diarrhea, constipation.  No hematemesis, melena or hematochezia. GENITOURINARY: No dysuria, frequency, hematuria. ENDOCRINE: No polyuria or nocturia. No heat or cold intolerance. HEMATOLOGY: No anemia, bruising, bleeding. INTEGUMENTARY: No rashes, ulcers, lesions. MUSCULOSKELETAL:left elbow and upper arm pain and swelling and erythema. No arthritis, gout, dyspnea. NEUROLOGIC: No numbness, tingling, ataxia, seizure-type activity, weakness. PSYCHIATRIC: No anxiety, depression, insomnia.   Past Medical History:  Diagnosis Date  . AAA (abdominal aortic aneurysm) without rupture ([place]) 03/03/2013   Small, partially thrombosed saccular aneurysm  . Arthritis    HANDS  . Atrial fibrillation ([place]) 02/10/2013  . Cancer ([place])    SKIN CANCERS  . Chronic diastolic congestive heart failure ([place]) 03/20/2013  . Chronic kidney disease, stage III (moderate)   . Constipation   . Erectile dysfunction   . Hearing loss    BILATERAL - WEARS HEARING AIDS  . History of torn meniscus of left knee    PAINFUL LEFT KNEE  . Mitral valve prolapse   . Mitral valve regurgitation   . OSA on CPAP   . S/P Maze operation for atrial fibrillation 03/15/2013  Complete bilateral lesion set using cryothermy and bipolar radiofrequency ablation with oversewing of LA appendage via right mini thoracotomy   . Spinal stenosis, thoracic 03/03/2013   T5-6    Past Surgical History:  Procedure Laterality Date  . CARPAL TUNNEL RELEASE     BILATERAL  . [place]  . INTRAOPERATIVE TRANSESOPHAGEAL ECHOCARDIOGRAM Right 03/15/2013   Procedure: INTRAOPERATIVE TRANSESOPHAGEAL ECHOCARDIOGRAM;  Surgeon: Rexene Alberts, MD;  Location: [place];  Service: Open Heart Surgery;  Laterality: Right;  . MINIMALLY INVASIVE MAZE PROCEDURE Right 03/15/2013   Procedure: MINIMALLY INVASIVE MAZE PROCEDURE;  Surgeon: Rexene Alberts, MD;  Location: [place];  Service: Open Heart Surgery;  Laterality: Right;  (R) AXILLARY CANNULATION  . MITRAL VALVE REPAIR Right 03/15/2013   Procedure: MINIMALLY INVASIVE MITRAL VALVE REPAIR (MVR);  Surgeon: Rexene Alberts, MD;  Location: [place];  Service: Open Heart Surgery;  Laterality: Right;  . RIGHT ROTATOR CUFF REPAIR    . SKIN CANCERS REMOVED FROM BOTH LEGS    . SKIN CANCERS REMOVED FROM LEFT FOREHEAD AND LEFT NOSE    . SURGERY LEFT NECK FOR REMOVAL OF METASTATIC LYMPH NODE ( FROM CANCER LEFT FOREHEAD)  AND LEFT NECK DISSECTION-REST OF LYMPH NODES NEGATIVE FOR ANY CANCER    . TEE WITHOUT CARDIOVERSION N/A 01/31/2013   Procedure: TRANSESOPHAGEAL ECHOCARDIOGRAM (TEE);  Surgeon: Sueanne Margarita, MD;  Location: [facility] ENDOSCOPY;  Service: Cardiovascular;  Laterality: N/A;     reports that he has quit smoking. He started smoking about 56 years ago. He has a 20.00 pack-year smoking history. He has never used smokeless tobacco. He reports that he drinks about 4.2 oz of alcohol per week . He reports that he does not use drugs.  Allergies  Allergen Reactions  . Statins     Muscle pains  . Latex Rash    bandaids--no other latex problems    Family History  Problem Relation Age of Onset  . Coronary artery disease Father   . Heart disease Father   . Heart attack Father   . Diabetes Mother   . Arthritis Mother   . Congestive Heart Failure Mother   . Alzheimer's disease Mother    . Alzheimer's disease Sister     Prior to Admission medications   Medication Sig Start Date End Date Taking? Authorizing Provider  cetirizine (ZYRTEC) 10 MG tablet Take 10 mg by mouth daily.     [provider]  cholecalciferol (VITAMIN D) 1000 UNITS tablet Take 1,000 Units by mouth every morning. Reported on 03/19/2016    [provider]  cyanocobalamin (,VITAMIN B-12,) 1000 MCG/ML injection Inject 1,000 mcg into the muscle every 14 (fourteen) days.     [provider]  docusate sodium (COLACE) 100 MG capsule Take 100 mg by mouth 2 (two) times daily.    [provider]  furosemide (LASIX) 20 MG tablet TAKE 1 TABLET BY MOUTH DAILY. Patient taking differently: TAKE 1 TABLET BY MOUTH EVERY OTHER DAY 11/15/15   Sueanne Margarita, MD  Glucosamine-Chondroit-Vit C-Mn (GLUCOSAMINE CHONDR 500 COMPLEX) CAPS Take 2 capsules by mouth daily.     [provider]  polyethylene glycol (MIRALAX / GLYCOLAX) packet Take 17 g by mouth daily.    [provider]  senna-docusate (SENOKOT-S) 8.6-50 MG tablet Take 2 tablets by mouth daily. Patient taking differently: Take 2 tablets by mouth daily as needed.  09/10/16   Venia Carbon, MD  sodium chloride (OCEAN) 0.65 % SOLN  nasal spray Place 1 spray into both nostrils as needed for congestion.    [provider]  warfarin (COUMADIN) 5 MG tablet TAKE AS DIRECTED BY COUMADIN CLINIC 02/03/16   Sueanne Margarita, MD    Physical Exam: Vitals:   07/11/17 1625  BP: (!) 155/82  Pulse: 68  Resp: 16  Temp: 97.6 F (36.4 C)  TempSrc: Oral  SpO2: 100%    GENERAL: 83 y.o.-year-old White male patient, well-developed, well-nourished lying in the bed in no acute distress.  Pleasant and cooperative.  Hard of hearing HEENT: Head atraumatic, normocephalic. Pupils equal. Mucus membranes moist. NECK: Supple, full range of motion. No JVD, no bruit heard. No thyroid enlargement, no tenderness, no cervical  lymphadenopathy. CHEST: Normal breath sounds bilaterally. No wheezing, rales, rhonchi or crackles. No use of accessory muscles of respiration.  No reproducible chest wall tenderness.  CARDIOVASCULAR: irregularly irregular with systolic murmur. Cap refill <2 seconds. Pulses intact distally.  ABDOMEN: Soft, nondistended, nontender. No rebound, guarding, rigidity. Normoactive bowel sounds present in all four quadrants.  EXTREMITIES: No pedal edema, cyanosis, or clubbing. No calf tenderness or Homan's sign.  NEUROLOGIC: The patient is alert and oriented x 3. Cranial nerves II through XII are grossly intact with no focal sensorimotor deficit. PSYCHIATRIC:  Normal affect, mood, thought content. SKIN: Warm, dry, and intact without obvious rash, lesion, or ulcer with the exception of the left arm which iserythematous from the lateral mid arm down through the lateral mid forearm with associated tenderness to palpation, mild edema and ecchymoses. There is some isolated erythema of the posterior left third digit. There is also an abrasion over the extensor surface of the elbow.    Labs on Admission:  CBC:  Recent Labs Lab 07/09/17 2033 07/11/17 1630  WBC 7.0 6.5  NEUTROABS  --  4.2  HGB 12.8* 13.4  HCT 37.9* 39.7*  MCV 89.5 90.7  PLT 207 583   Basic Metabolic Panel:  Recent Labs Lab 07/09/17 2033 07/11/17 1630  NA 139 139  K 4.5 4.3  CL 104 102  CO2 29 28  GLUCOSE 103* 95  BUN 22* 24*  CREATININE 1.53* 1.68*  CALCIUM 9.1 9.6   GFR: CrCl cannot be calculated (Unknown ideal weight.). Liver Function Tests: No results for input(s): AST, ALT, ALKPHOS, BILITOT, PROT, ALBUMIN in the last 168 hours. No results for input(s): LIPASE, AMYLASE in the last 168 hours. No results for input(s): AMMONIA in the last 168 hours. Coagulation Profile:  Recent Labs Lab 07/09/17 07/09/17 2257  INR 3.0 3.37   Cardiac Enzymes: No results for input(s): CKTOTAL, CKMB, CKMBINDEX, TROPONINI in the last  168 hours. BNP (last 3 results) No results for input(s): PROBNP in the last 8760 hours. HbA1C: No results for input(s): HGBA1C in the last 72 hours. CBG:  Recent Labs Lab 07/09/17 2036  GLUCAP 89   Lipid Profile: No results for input(s): CHOL, HDL, LDLCALC, TRIG, CHOLHDL, LDLDIRECT in the last 72 hours. Thyroid Function Tests: No results for input(s): TSH, T4TOTAL, FREET4, T3FREE, THYROIDAB in the last 72 hours. Anemia Panel: No results for input(s): VITAMINB12, FOLATE, FERRITIN, TIBC, IRON, RETICCTPCT in the last 72 hours. Urine analysis:    Component Value Date/Time   COLORURINE YELLOW (A) 07/09/2017 2033   APPEARANCEUR CLEAR (A) 07/09/2017 2033   LABSPEC 1.020 07/09/2017 2033   PHURINE 5.0 07/09/2017 2033   GLUCOSEU NEGATIVE 07/09/2017 2033   HGBUR NEGATIVE 07/09/2017 2033   [place] NEGATIVE 07/09/2017 2033   [place] NEGATIVE 07/09/2017 2033  PROTEINUR NEGATIVE 07/09/2017 2033   UROBILINOGEN 0.2 03/13/2013 1317   NITRITE NEGATIVE 07/09/2017 2033   LEUKOCYTESUR NEGATIVE 07/09/2017 2033   Sepsis Labs: @LABRCNTIP (procalcitonin:4,lacticidven:4) )No results found for this or any previous visit (from the past 240 hour(s)).   Radiological Exams on Admission: Dg Chest 2 View  Result Date: 07/09/2017 CLINICAL DATA:  Syncope and rib pain EXAM: CHEST  2 VIEW COMPARISON:  Chest radiograph 03/27/2013 FINDINGS: There is cardiomegaly with sequelae of mitral valve repair. No pulmonary edema or focal consolidation. Surgical clips overlie the right lung apex. No pleural effusion or pneumothorax. No rib fracture. IMPRESSION: Cardiomegaly without focal airspace disease.  No rib fracture. Electronically Signed   By: Ulyses Jarred M.D.   On: 07/09/2017 23:00    Assessment/Plan  This is a 83 y.o. male with a history of atrial fibrillation, chronic diastolic congestive heart failure, chronic kidney disease stage III, chronic constipation, hearing loss, obstructive sleep apnea now  being admitted with:  #. Cellulitis of the left arm, failed outpatient therapy Admit to observation Continue IV vancomycin Local wound care  #. Acute on chronic constipation Continue senna, Colace and MiraLAX Encourage patient to drink water, likely drinking less than 8 ounces of water per day at this point  #. History of atrial fibrillation Continue warfarin  #. History of chronic diastolic heart failure Continue Lasix  #. History of CK D, stable at baseline - Continue to monitor BMP  Admission status: observation IV Fluids: Hep-Lock Diet/Nutrition: heart healthy Consults called: none  DVT Px: Coumadin SCDs and early ambulation. Code Status: Full Code  Disposition Plan: To home in 1-2 days  All the records are reviewed and case discussed with ED provider. Management plans discussed with the patient and/or family who express understanding and agree with plan of care.  Luv Mish D.O. on 07/11/2017 at 8:32 PM Between 7am to 6pm - Pager - [phone] After 6pm go to [email or web address] - [facility] [facility] [place] [facility] Office [phone] CC: Primary care physician; Venia Carbon, MD   07/11/2017, 8:32 PM

## 2017-07-11 NOTE — Progress Notes (Signed)
ANTICOAGULATION CONSULT NOTE - Initial Consult  Pharmacy Consult for warfarin management Indication: atrial fibrillation/h/o mitral valve repair  Allergies  Allergen Reactions  . Statins     Muscle pains  . Latex Rash    bandaids--no other latex problems    Patient Measurements: Weight: 168 lb 1.6 oz (76.2 kg) Heparin Dosing Weight: 79.4 kg  Vital Signs: Temp: 97.9 F (36.6 C) (08/19 2243) Temp Source: Oral (08/19 2243) BP: 154/79 (08/19 2243) Pulse Rate: 81 (08/19 2243)  Labs:  Recent Labs  07/09/17 07/09/17 2033 07/09/17 2257 07/11/17 1630 07/11/17 2023  HGB  --  12.8*  --  13.4  --   HCT  --  37.9*  --  39.7*  --   PLT  --  207  --  220  --   LABPROT  --   --  34.9*  --  32.4*  INR 3.0  --  3.37  --  3.07  CREATININE  --  1.53*  --  1.68*  --     CrCl cannot be calculated (Unknown ideal weight.).   Medical History: Past Medical History:  Diagnosis Date  . AAA (abdominal aortic aneurysm) without rupture ([place]) 03/03/2013   Small, partially thrombosed saccular aneurysm  . Arthritis    HANDS  . Atrial fibrillation ([place]) 02/10/2013  . Cancer ([place])    SKIN CANCERS  . Chronic diastolic congestive heart failure ([place]) 03/20/2013  . Chronic kidney disease, stage III (moderate)   . Constipation   . Erectile dysfunction   . Hearing loss    BILATERAL - WEARS HEARING AIDS  . History of torn meniscus of left knee    PAINFUL LEFT KNEE  . Mitral valve prolapse   . Mitral valve regurgitation   . OSA on CPAP   . S/P Maze operation for atrial fibrillation 03/15/2013   Complete bilateral lesion set using cryothermy and bipolar radiofrequency ablation with oversewing of LA appendage via right mini thoracotomy  . Spinal stenosis, thoracic 03/03/2013   T5-6    Medications:  Scheduled:  . [START ON 07/12/2017] cholecalciferol  1,000 Units Oral q morning - 10a  . docusate sodium  100 mg Oral BID  . [START ON 07/12/2017] furosemide  20 mg Oral QODAY  . [START ON 07/12/2017]  loratadine  10 mg Oral Daily  . [START ON 07/12/2017] polyethylene glycol  17 g Oral Daily  . [START ON 07/12/2017] senna-docusate  2 tablet Oral Daily  . sodium chloride flush  3 mL Intravenous Q12H  . [START ON 07/13/2017] warfarin  5 mg Oral ONCE-1800  . [START ON 07/12/2017] Warfarin - Pharmacist Dosing Inpatient   Does not apply q1800    Assessment: Patient admitted for LUE cellulitis s/p falling on a sand trap while golfing. Pt. Was given PO keflex and IV clindamycin in ER; however, cellulitis did not improve and continued to worsen. Pt. Has a h/o of afib and mitral valve repair. Per coumadin clinic INR goal is 2 - 3 for afib.  Warfarin regimen per coumadin clinic: Warfarin 5 mg Sun Tues Thurs Warfarin 2.5 mg all other days (MWFSat).  8/17 INR 3.37 8/19 INR 3.07  Goal of Therapy:  INR 2-3 Monitor platelets by anticoagulation protocol: Yes   Plan:  Considering INR is slightly supratherapeutic 3.07 based on a goal of 2 - 3, Will hold 8/20 dose and will restart patient on his home regimen 8/21. Will adjust doses based on INR trends. Will monitor daily INRs and CBC.  Tobie Lords,  PharmD, BCPS Clinical Pharmacist 07/11/2017

## 2017-07-11 NOTE — ED Provider Notes (Signed)
[facility] Emergency Department Provider Note  ____________________________________________  Time seen: Approximately 7:58 PM  I have reviewed the triage vital signs and the nursing notes.   HISTORY  Chief Complaint Cellulitis    HPI [name] is a 83 y.o. male brought to the ED for reevaluation today due to worsening pain and swelling and erythema of the left upper extremity. He had a fall [place] 2 days ago,was started on Keflex and given a dose of IV clindamycin in the ED. Wound was debrided and cleansed and dressed with Xeroform in the ED. However, despite continuing the Keflex over the past 48 hours his symptoms of continued to worsen. No fevers chills chest pain or shortness of breath. No vomiting. He does have valvular heart disease status post mitral valve replacement, on warfarin. Pain is constant and worse with movement and palpation. Nonradiating. Moderate intensity.      Past Medical History:  Diagnosis Date  . AAA (abdominal aortic aneurysm) without rupture ([place]) 03/03/2013   Small, partially thrombosed saccular aneurysm  . Arthritis    HANDS  . Atrial fibrillation ([place]) 02/10/2013  . Cancer ([place])    SKIN CANCERS  . Chronic diastolic congestive heart failure ([place]) 03/20/2013  . Chronic kidney disease, stage III (moderate)   . Constipation   . Erectile dysfunction   . Hearing loss    BILATERAL - WEARS HEARING AIDS  . History of torn meniscus of left knee    PAINFUL LEFT KNEE  . Mitral valve prolapse   . Mitral valve regurgitation   . OSA on CPAP   . S/P Maze operation for atrial fibrillation 03/15/2013   Complete bilateral lesion set using cryothermy and bipolar radiofrequency ablation with oversewing of LA appendage via right mini thoracotomy  . Spinal stenosis, thoracic 03/03/2013   T5-6     Patient Active Problem List   Diagnosis Date Noted  . Osteoarthritis of left shoulder 11/19/2016  .  Osteoarthrosis involving multiple sites 09/10/2016  . Constipation 06/11/2016  . Osteoarthritis of both knees 03/19/2016  . Chronic kidney disease, stage III (moderate)   . S/P MVR (mitral valve repair) 03/28/2015  . Encounter for therapeutic drug monitoring 12/26/2013  . OSA on CPAP   . Chronic diastolic congestive heart failure ([place]) 03/20/2013  . S/P Maze operation for atrial fibrillation 03/15/2013  . MR (mitral regurgitation) 03/03/2013  . PAF (paroxysmal atrial fibrillation) ([place]) 03/03/2013  . Spinal stenosis, thoracic 03/03/2013  . AAA (abdominal aortic aneurysm) without rupture ([place]) 03/03/2013  . Mitral valve prolapse, nonrheumatic 01/24/2013     Past Surgical History:  Procedure Laterality Date  . CARPAL TUNNEL RELEASE     BILATERAL  . [place]  . INTRAOPERATIVE TRANSESOPHAGEAL ECHOCARDIOGRAM Right 03/15/2013   Procedure: INTRAOPERATIVE TRANSESOPHAGEAL ECHOCARDIOGRAM;  Surgeon: Rexene Alberts, MD;  Location: [place];  Service: Open Heart Surgery;  Laterality: Right;  . MINIMALLY INVASIVE MAZE PROCEDURE Right 03/15/2013   Procedure: MINIMALLY INVASIVE MAZE PROCEDURE;  Surgeon: Rexene Alberts, MD;  Location: [place];  Service: Open Heart Surgery;  Laterality: Right;  (R) AXILLARY CANNULATION  . MITRAL VALVE REPAIR Right 03/15/2013   Procedure: MINIMALLY INVASIVE MITRAL VALVE REPAIR (MVR);  Surgeon: Rexene Alberts, MD;  Location: [place];  Service: Open Heart Surgery;  Laterality: Right;  . RIGHT ROTATOR CUFF REPAIR    . SKIN CANCERS REMOVED FROM BOTH LEGS    . SKIN CANCERS REMOVED  FROM LEFT FOREHEAD AND LEFT NOSE    . SURGERY LEFT NECK FOR REMOVAL OF METASTATIC LYMPH NODE ( FROM CANCER LEFT FOREHEAD)  AND LEFT NECK DISSECTION-REST OF LYMPH NODES NEGATIVE FOR ANY CANCER    . TEE WITHOUT CARDIOVERSION N/A 01/31/2013   Procedure: TRANSESOPHAGEAL ECHOCARDIOGRAM (TEE);  Surgeon: Sueanne Margarita, MD;  Location: [facility] ENDOSCOPY;  Service: Cardiovascular;  Laterality: N/A;      Prior to Admission medications   Medication Sig Start Date End Date Taking? Authorizing Provider  cetirizine (ZYRTEC) 10 MG tablet Take 10 mg by mouth daily.     [provider]  cholecalciferol (VITAMIN D) 1000 UNITS tablet Take 1,000 Units by mouth every morning. Reported on 03/19/2016    [provider]  cyanocobalamin (,VITAMIN B-12,) 1000 MCG/ML injection Inject 1,000 mcg into the muscle every 14 (fourteen) days.     [provider]  docusate sodium (COLACE) 100 MG capsule Take 100 mg by mouth 2 (two) times daily.    [provider]  furosemide (LASIX) 20 MG tablet TAKE 1 TABLET BY MOUTH DAILY. Patient taking differently: TAKE 1 TABLET BY MOUTH EVERY OTHER DAY 11/15/15   Sueanne Margarita, MD  Glucosamine-Chondroit-Vit C-Mn (GLUCOSAMINE CHONDR 500 COMPLEX) CAPS Take 2 capsules by mouth daily.     [provider]  polyethylene glycol (MIRALAX / GLYCOLAX) packet Take 17 g by mouth daily.    [provider]  senna-docusate (SENOKOT-S) 8.6-50 MG tablet Take 2 tablets by mouth daily. Patient taking differently: Take 2 tablets by mouth daily as needed.  09/10/16   Venia Carbon, MD  sodium chloride (OCEAN) 0.65 % SOLN nasal spray Place 1 spray into both nostrils as needed for congestion.    [provider]  warfarin (COUMADIN) 5 MG tablet TAKE AS DIRECTED BY COUMADIN CLINIC 02/03/16   Sueanne Margarita, MD     Allergies Statins and Latex   Family History  Problem Relation Age of Onset  . Coronary artery disease Father   . Heart disease Father   . Heart attack Father   . Diabetes Mother   . Arthritis Mother   . Congestive Heart Failure Mother   . Alzheimer's disease Mother   . Alzheimer's disease Sister     Social History Social History  Substance Use Topics  . Smoking status: Former Smoker    Packs/day: 1.00    Years: 20.00    Start date: 11/23/1960  . Smokeless tobacco: Never Used     Comment: [place]  . Alcohol use 4.2 oz/week    7 Standard drinks or equivalent per week     Comment: Cocktail or glass of wine with dinner    Review of Systems  Constitutional:   No fever or chills.  ENT:   No sore throat. No rhinorrhea. Cardiovascular:   No chest pain or syncope. Respiratory:   No dyspnea or cough. Gastrointestinal:   Negative for abdominal pain, vomiting and diarrhea.  Musculoskeletal:   Positive pain and swelling of the left upper extremity. All other systems reviewed and are negative except as documented above in ROS and HPI.  ____________________________________________   PHYSICAL EXAM:  VITAL SIGNS: ED Triage Vitals [07/11/17 1625]  Enc Vitals Group     BP (!) 155/82     Pulse Rate 68     Resp 16     Temp 97.6 F (36.4 C)     Temp Source Oral     SpO2 100 %  Weight      Height      Head Circumference      Peak Flow      Pain Score      Pain Loc      Pain Edu?      Excl. in [place]?     Vital signs reviewed, nursing assessments reviewed.   Constitutional:   Alert and oriented. Not in distress. Eyes:   No scleral icterus.  EOMI. No nystagmus. No conjunctival pallor. PERRL. ENT   Head:   Normocephalic and atraumatic.   Nose:   No congestion/rhinnorhea.    Mouth/Throat:   MMM, no pharyngeal erythema. No peritonsillar mass.    Neck:   No meningismus. Full ROM Hematological/Lymphatic/Immunilogical:   No cervical lymphadenopathy. Cardiovascular:   Irregularly irregular rhythm. Symmetric bilateral radial and DP pulses.  No murmurs. Good peripheral capillary refill Respiratory:   Normal respiratory effort without tachypnea/retractions. Breath sounds are clear and equal bilaterally. No wheezes/rales/rhonchi. Gastrointestinal:   Soft and nontender. Non distended. There is no CVA tenderness.  No rebound, rigidity, or guarding. Genitourinary:   deferred Musculoskeletal:   Normal range of motion in all extremities. No joint effusions.  No focal bony  tenderness. No lower extremity tenderness.  No edema. Neurologic:   Normal speech and language.  Motor grossly intact. No gross focal neurologic deficits are appreciated.  Skin:    Skin is warm, dry and intact. Diffuse erythema of the left upper extremity from the area of the mid humerus extending down to the proximal phalanges of the digits. Distal portion is widely ecchymotic with some inflammatory changes as well. Diffusely tender to the touch. No purulent drainage or crepitus. Abrasion over the extensor surface of the elbow has some slight oozing but mostly hemostatic.   ____________________________________________    LABS (pertinent positives/negatives) (all labs ordered are listed, but only abnormal results are displayed) Labs Reviewed  CBC WITH DIFFERENTIAL/PLATELET - Abnormal; Notable for the following:       Result Value   RBC 4.37 (*)    HCT 39.7 (*)    All other components within normal limits  BASIC METABOLIC PANEL - Abnormal; Notable for the following:    BUN 24 (*)    Creatinine, Ser 1.68 (*)    GFR calc non Af Amer 35 (*)    GFR calc Af Amer 41 (*)    All other components within normal limits  PROTIME-INR   ____________________________________________   EKG    ____________________________________________    RADIOLOGY  No results found.  ____________________________________________   PROCEDURES Procedures  ____________________________________________   INITIAL IMPRESSION / ASSESSMENT AND PLAN / ED COURSE  Pertinent labs & imaging results that were available during my care of the patient were reviewed by me and considered in my medical decision making (see chart for details).  Patient not in distress, vital signs unremarkable, initial labs unremarkable, but with worsening erythema and inflammatory changes of the left upper extremity after a recent fall with contaminated wound. Tetanus is up-to-date, patient has been on Keflex for the past 48 hours  and received a dose of clindamycin in the ED initially as well, but still having worsening symptoms. Start IV vancomycin and plan for hospitalization for further management. Add on INR level due to Coumadin use.      ____________________________________________   FINAL CLINICAL IMPRESSION(S) / ED DIAGNOSES  Final diagnoses:  Left arm cellulitis  Chronic kidney disease, unspecified CKD stage      New Prescriptions   No  medications on file     Portions of this note were generated with dragon dictation software. Dictation errors may occur despite best attempts at proofreading.    Carrie Mew, MD 07/11/17 2007

## 2017-07-11 NOTE — ED Notes (Signed)
Pt up to the bathroom, assisted by his daughter. Ambulatory with no difficulty at this time.

## 2017-07-11 NOTE — ED Notes (Signed)
Clean bandage applied, more xerofoam added to patient's previous dressing due to patient's daughter's complaints of continued bleeding with dressing changes. Small skin tear noted just above where xeroform dressing stopped, dressing applied and wrapped by this RN.

## 2017-07-11 NOTE — ED Triage Notes (Signed)
Pt was seen 2 days ago and was diagnosed with cellulitis related to a fall last week. Pt states that the pain and swelling in his left arm is getting worse. Pt family states that the redness is extending up arm. Swelling noted in the hand and finger. Moderate pulses present.

## 2017-07-12 DIAGNOSIS — K5909 Other constipation: Secondary | ICD-10-CM | POA: Diagnosis present

## 2017-07-12 DIAGNOSIS — H9190 Unspecified hearing loss, unspecified ear: Secondary | ICD-10-CM | POA: Diagnosis present

## 2017-07-12 DIAGNOSIS — W1839XA Other fall on same level, initial encounter: Secondary | ICD-10-CM | POA: Diagnosis present

## 2017-07-12 DIAGNOSIS — Z8249 Family history of ischemic heart disease and other diseases of the circulatory system: Secondary | ICD-10-CM | POA: Diagnosis not present

## 2017-07-12 DIAGNOSIS — Z974 Presence of external hearing-aid: Secondary | ICD-10-CM | POA: Diagnosis not present

## 2017-07-12 DIAGNOSIS — S50312A Abrasion of left elbow, initial encounter: Secondary | ICD-10-CM | POA: Diagnosis present

## 2017-07-12 DIAGNOSIS — N183 Chronic kidney disease, stage 3 (moderate): Secondary | ICD-10-CM | POA: Diagnosis present

## 2017-07-12 DIAGNOSIS — Z7901 Long term (current) use of anticoagulants: Secondary | ICD-10-CM | POA: Diagnosis not present

## 2017-07-12 DIAGNOSIS — I714 Abdominal aortic aneurysm, without rupture: Secondary | ICD-10-CM | POA: Diagnosis present

## 2017-07-12 DIAGNOSIS — L03114 Cellulitis of left upper limb: Secondary | ICD-10-CM | POA: Diagnosis present

## 2017-07-12 DIAGNOSIS — Z85828 Personal history of other malignant neoplasm of skin: Secondary | ICD-10-CM | POA: Diagnosis not present

## 2017-07-12 DIAGNOSIS — L0390 Cellulitis, unspecified: Secondary | ICD-10-CM | POA: Diagnosis present

## 2017-07-12 DIAGNOSIS — Z79899 Other long term (current) drug therapy: Secondary | ICD-10-CM | POA: Diagnosis not present

## 2017-07-12 DIAGNOSIS — Z888 Allergy status to other drugs, medicaments and biological substances status: Secondary | ICD-10-CM | POA: Diagnosis not present

## 2017-07-12 DIAGNOSIS — M79622 Pain in left upper arm: Secondary | ICD-10-CM | POA: Diagnosis present

## 2017-07-12 DIAGNOSIS — Z833 Family history of diabetes mellitus: Secondary | ICD-10-CM | POA: Diagnosis not present

## 2017-07-12 DIAGNOSIS — Y9239 Other specified sports and athletic area as the place of occurrence of the external cause: Secondary | ICD-10-CM | POA: Diagnosis not present

## 2017-07-12 DIAGNOSIS — I5032 Chronic diastolic (congestive) heart failure: Secondary | ICD-10-CM | POA: Diagnosis present

## 2017-07-12 DIAGNOSIS — G4733 Obstructive sleep apnea (adult) (pediatric): Secondary | ICD-10-CM | POA: Diagnosis present

## 2017-07-12 DIAGNOSIS — Z952 Presence of prosthetic heart valve: Secondary | ICD-10-CM | POA: Diagnosis not present

## 2017-07-12 DIAGNOSIS — Z87891 Personal history of nicotine dependence: Secondary | ICD-10-CM | POA: Diagnosis not present

## 2017-07-12 DIAGNOSIS — Z91040 Latex allergy status: Secondary | ICD-10-CM | POA: Diagnosis not present

## 2017-07-12 DIAGNOSIS — I482 Chronic atrial fibrillation: Secondary | ICD-10-CM | POA: Diagnosis present

## 2017-07-12 LAB — BASIC METABOLIC PANEL
Anion gap: 7 (ref 5–15)
BUN: 21 mg/dL — AB (ref 6–20)
CHLORIDE: 105 mmol/L (ref 101–111)
CO2: 29 mmol/L (ref 22–32)
CREATININE: 1.41 mg/dL — AB (ref 0.61–1.24)
Calcium: 9 mg/dL (ref 8.9–10.3)
GFR calc Af Amer: 50 mL/min — ABNORMAL LOW (ref >60)
GFR calc non Af Amer: 44 mL/min — ABNORMAL LOW (ref >60)
GLUCOSE: 82 mg/dL (ref 65–99)
POTASSIUM: 3.9 mmol/L (ref 3.5–5.1)
SODIUM: 141 mmol/L (ref 135–145)

## 2017-07-12 LAB — CBC
HEMATOCRIT: 36.6 % — AB (ref 40.0–52.0)
Hemoglobin: 12.5 g/dL — ABNORMAL LOW (ref 13.0–18.0)
MCH: 30.9 pg (ref 26.0–34.0)
MCHC: 34.2 g/dL (ref 32.0–36.0)
MCV: 90.4 fL (ref 80.0–100.0)
Platelets: 194 10*3/uL (ref 150–440)
RBC: 4.05 MIL/uL — ABNORMAL LOW (ref 4.40–5.90)
RDW: 13.6 % (ref 11.5–14.5)
WBC: 5.8 10*3/uL (ref 3.8–10.6)

## 2017-07-12 LAB — PROTIME-INR
INR: 3.23
Prothrombin Time: 33.7 seconds — ABNORMAL HIGH (ref 11.4–15.2)

## 2017-07-12 MED ORDER — VANCOMYCIN HCL IN DEXTROSE 1-5 GM/200ML-% IV SOLN
1000.0000 mg | INTRAVENOUS | Status: DC
  Administered 2017-07-12 – 2017-07-13 (×2): 1000 mg via INTRAVENOUS
  Filled 2017-07-12 (×2): qty 200

## 2017-07-12 NOTE — Clinical Social Work Note (Signed)
Clinical Social Work Assessment  Patient Details  Name: [name] MRN: [id number] Date of Birth: January 31, 1929  Date of referral:  07/12/17               Reason for consult:  Other (Comment Required) (From [facility] )                Permission sought to share information with:  Chartered certified accountant granted to share information::  Yes, Verbal Permission Granted  Name::      [facility]   Agency::     Relationship::     Contact Information:     Housing/Transportation Living arrangements for the past 2 months:  [place] of Information:  Patient, Facility, Adult Children Patient Interpreter Needed:  None Criminal Activity/Legal Involvement Pertinent to Current Situation/Hospitalization:  No - Comment as needed Significant Relationships:  Adult Children, Other Family Members Lives with:  Facility Resident Do you feel safe going back to the place where you live?  Yes Need for family participation in patient care:  No (Coment)  Care giving concerns:  Patient is a resident at [facility] fax: [phone].   Social Worker assessment / plan:  Holiday representative ([place]) received consult that patient is from [facility]. CSW contacted [facility] admissions coordinator at [facility] who confirmed that patient is an ALF resident and the [place] is Luellen Pucker [phone] or [phone]. CSW contacted [place] and made her aware that patient is at [facility] and that PT is recommending no follow up. Per Luellen Pucker patient can return to ALF side when stable. CSW met with patient and his daughter [name] [phone] was at bedside. Patient was alert and oriented X4 and was laying in the bed. CSW introduced self and explained role of CSW department. Patient stated that he has been at [facility] since June 2017 and is still very active. Per patient he was on the golf [place] when he hurt his arm. Patient and daughter are agreeable for  patient to return to [facility]. Per daughter she will provide transport for patient back to [facility] when he is stable for D/C. CSW will continue to follow and assist as needed.   Employment status:  Retired Nurse, adult PT Recommendations:  No Follow Up Information / Referral to community resources:  Other (Comment Required) (Patient will return to ALF )  Patient/Family's Response to care:  Patient and his daughter [name] are agreeable for patient to return to [facility].   Patient/Family's Understanding of and Emotional Response to Diagnosis, Current Treatment, and Prognosis:  Patient and his daughter [name] were very pleasant and thanked CSW for visit.   Emotional Assessment Appearance:  Appears stated age Attitude/Demeanor/Rapport:    Affect (typically observed):  Accepting, Adaptable, Pleasant Orientation:  Oriented to Self, Oriented to Place, Oriented to  Time, Oriented to Situation Alcohol / Substance use:  Not Applicable Psych involvement (Current and /or in the community):  No (Comment)  Discharge Needs  Concerns to be addressed:  Discharge Planning Concerns Readmission within the last 30 days:  No Current discharge risk:  None Barriers to Discharge:  Continued Medical Work up   [employer], Veronia Beets, LCSW 07/12/2017, 2:29 PM

## 2017-07-12 NOTE — Progress Notes (Signed)
[facility] [place] NAME: [name]    MR#:  [id number]  DATE OF BIRTH:  [birth year]  SUBJECTIVE: Admitted for left elbow cellulitis and failed outpatient therapy. On IV vancomycin. It is hard of hearing but he says he feels slightly better today.   CHIEF COMPLAINT:   Chief Complaint  Patient presents with  . Cellulitis    REVIEW OF SYSTEMS:    Review of Systems  Constitutional: Negative for chills and fever.  HENT: Positive for hearing loss.   Eyes: Negative for blurred vision, double vision and photophobia.  Respiratory: Negative for cough, hemoptysis and shortness of breath.   Cardiovascular: Negative for palpitations, orthopnea and leg swelling.  Gastrointestinal: Negative for abdominal pain, diarrhea and vomiting.  Genitourinary: Negative for dysuria and urgency.  Musculoskeletal: Negative for myalgias and neck pain.  Skin: Negative for rash.  Neurological: Negative for dizziness, focal weakness, seizures, weakness and headaches.  Psychiatric/Behavioral: Negative for memory loss. The patient does not have insomnia.    patient has erythema of the left upper extremity from mid humerus extending down to the proximal phalanx of digits. If his blood tender to touch. No purulent drainage.  Nutrition:  Tolerating Diet: Tolerating PT:      DRUG ALLERGIES:   Allergies  Allergen Reactions  . Statins     Muscle pains  . Latex Rash    bandaids--no other latex problems    VITALS:  Blood pressure 131/63, pulse 63, temperature 98.3 F (36.8 C), temperature source Oral, resp. rate 18, height 5\' 10"  (1.778 m), weight 76.2 kg (168 lb 1.6 oz), SpO2 95 %.  PHYSICAL EXAMINATION:   Physical Exam  GENERAL:  83 y.o.-year-old patient lying in the bed with no acute distress.  EYES: Pupils equal, round, reactive to light and accommodation. No scleral icterus. Extraocular muscles intact.  HEENT: Head atraumatic, normocephalic.  Oropharynx and nasopharynx clear.  NECK:  Supple, no jugular venous distention. No thyroid enlargement, no tenderness.  LUNGS: Normal breath sounds bilaterally, no wheezing, rales,rhonchi or crepitation. No use of accessory muscles of respiration.  CARDIOVASCULAR: S1, S2 normal. No murmurs, rubs, or gallops.  ABDOMEN: Soft, nontender, nondistended. Bowel sounds present. No organomegaly or mass.  EXTREMITIES: No pedal edema, cyanosis, or clubbing.  NEUROLOGIC: Cranial nerves II through XII are intact. Muscle strength 5/5 in all extremities. Sensation intact. Gait not checked.  PSYCHIATRIC: The patient is alert and oriented x 3.  SKIN:  noted to have inflammation and erythema extending from mid humerus to proximal phalanx. No crepitus or purulent drainage. Patient has abrasion on extensor surface of the elbow   LABORATORY PANEL:   CBC  Recent Labs Lab 07/12/17 0339  WBC 5.8  HGB 12.5*  HCT 36.6*  PLT 194   ------------------------------------------------------------------------------------------------------------------  Chemistries   Recent Labs Lab 07/12/17 0339  NA 141  K 3.9  CL 105  CO2 29  GLUCOSE 82  BUN 21*  CREATININE 1.41*  CALCIUM 9.0   ------------------------------------------------------------------------------------------------------------------  Cardiac Enzymes No results for input(s): TROPONINI in the last 168 hours. ------------------------------------------------------------------------------------------------------------------  RADIOLOGY:  No results found.   ASSESSMENT AND PLAN:   Active Problems:   Cellulitis of left elbow   #1. Left arm cellulitis and failed outpatient therapy with worsening swelling, redness. On IV vancomycin, continue IV vancomycin for today, possible discharge tomorrow home with clindamycin.  #2 history of chronic atrial fibrillation: Rate controlled. Not on any medicines at home for rate control. Continue warfarin for  anticoagulation by seatbelt  #3. chronic diastolic heart failure: Stable. Continue Lasix 20 mg daily.  .  All the records are reviewed and case discussed with Care Management/Social Workerr. Management plans discussed with the patient, family and they are in agreement.  CODE STATUS: full  TOTAL TIME TAKING CARE OF THIS PATIENT: 35 minutes.   POSSIBLE D/C IN 1-2 DAYS, DEPENDING ON CLINICAL CONDITION.   Epifanio Lesches M.D on 07/12/2017 at 9:30 AM  Between 7am to 6pm - Pager - [phone]  After 6pm go to [email or web address] - password [email or web address] [place] [facility]  Office  [phone]  CC: Primary care physician; Venia Carbon, MD

## 2017-07-12 NOTE — Progress Notes (Signed)
Pharmacy Antibiotic Note  [name] is a 83 y.o. male admitted on 07/11/2017 with cellulitis.  Pharmacy has been consulted for vancomycin dosing.  Plan: Patient received vanc 1g IV x 1  Will start patient on vanc 1g IV q24h w/ 8 hour stack dose. Will draw a vanc trough 8/23 @ 0400 prior to 4th dose. Ke 0.0314 T1/2 22 ~ 24 hours Although this is a cellulitis, patient is having increased erythema and redness w/ painful range of motion through his arm so will target a vanc trough of 15 - 20 mcg/mL.  Height: 5\' 10"  (177.8 cm) Weight: 168 lb 1.6 oz (76.2 kg) IBW/kg (Calculated) : 73  Temp (24hrs), Avg:97.8 F (36.6 C), Min:97.6 F (36.4 C), Max:97.9 F (36.6 C)   Recent Labs Lab 07/09/17 2033 07/11/17 1630  WBC 7.0 6.5  CREATININE 1.53* 1.68*    Estimated Creatinine Clearance: 32.6 mL/min (A) (by C-G formula based on SCr of 1.68 mg/dL (H)).    Allergies  Allergen Reactions  . Statins     Muscle pains  . Latex Rash    bandaids--no other latex problems    Thank you for allowing pharmacy to be a part of this patient's care.  Tobie Lords, PharmD, BCPS Clinical Pharmacist 07/12/2017

## 2017-07-12 NOTE — Evaluation (Signed)
Physical Therapy Evaluation Patient Details Name: [name] MRN: [id number] DOB: 02-17-1929 Today's Date: 07/12/2017   History of Present Illness  [name] is a 83 y.o. male with a known history of atrial fibrillation, chronic diastolic congestive heart failure, chronic kidney disease stage III, chronic constipation, hearing loss, obstructive sleep apnea presents to the emergency department for evaluation of left arm.  Patient was in a usual state of health until 5 days ago when he was golfing and he suddenly became lightheaded, dizzy, and weak. He fell backwards landing on his left elbow. He recovered spontaneously and never lost consciousness, no head trauma however he did have a significant abrasion to his left elbow and upper extremity. He was seen by the nursing staff at his assisted living who started antibiotics but 4 days ago he presented to the emergency department where the wound was cleaned and he received a dose of IV clindamycin and PO Keflex and was discharged home. He reports that over the past 4 days his symptoms have worsened and now include pain swelling and redness over the left lateral arm associated with painful range of motion. Of note patient and his daughter believed that his fall 5 days ago was related to dehydration.  Clinical Impression  Pt admitted with above diagnosis. Pt currently with functional limitations due to the deficits listed below (see PT Problem List).  Pt requires minA+1 for supine to sit but is modified independent to return to bed. He is supervision only for transfers and CGA for ambulation around RN station. Pt ambulates with slightly slowed gait speed for age. Step length is also decreased. Pt with some mild lateral staggering with horizontal and vertical head turns during gait. VSS throughout ambulation and pt denies DOE. No overt LOB and no external assist required to stabilize gait. Negative Rhomberg and single leg balance is approximately 5s on  each leg. Five Time Sit to STand test is 9.8s demonstrating excellent bilateral LE strength. Pt encouraged to utilize single point cane as needed for added stability with ambulation. He appears likely close to baseline and does not demonstrate acute needs for PT after discharge. Pt will benefit from PT services during admission to address deficits in strength, balance, and mobility in order to return to full function at [facility].      Follow Up Recommendations No PT follow up    Equipment Recommendations  None recommended by PT;Other (comment) (Encouraged to use single point cane at discharge)    Recommendations for Other Services       Precautions / Restrictions Precautions Precautions: Fall Restrictions Weight Bearing Restrictions: No      Mobility  Bed Mobility Overal bed mobility: Needs Assistance Bed Mobility: Supine to Sit;Sit to Supine     Supine to sit: Min assist Sit to supine: Modified independent (Device/Increase time)   General bed mobility comments: Pt requires HOB elevated and bed rail to perform supine to sit to exit bed on L side. When returning to bed does not require assist to complete  Transfers Overall transfer level: Needs assistance Equipment used: None Transfers: Sit to/from Stand Sit to Stand: Supervision         General transfer comment: Pt able to perform transfers confidently and safely without UE support in standing  Ambulation/Gait Ambulation/Gait assistance: Min guard Ambulation Distance (Feet): 220 Feet Assistive device: None Gait Pattern/deviations: Decreased step length - right;Decreased step length - left Gait velocity: Decreased but functional for facility mobility   General Gait Details:  Pt ambulates around RN station with slightly slowed gait speed for age. Step length is also decreased. Pt with some mild lateral staggering with horizontal and vertical head turns during gait. VSS throughout ambulation and pt denies DOE. No  overt LOB and no external assist required to stabilize gait  Stairs            Wheelchair Mobility    Modified Rankin (Stroke Patients Only)       Balance Overall balance assessment: Needs assistance Sitting-balance support: No upper extremity supported Sitting balance-Leahy Scale: Good     Standing balance support: No upper extremity supported Standing balance-Leahy Scale: Fair Standing balance comment: Able to maintain balance with feet apart/together. Negative Rhomberg. Single leg stance balance is approximately 5 seconds bilaterally                             Pertinent Vitals/Pain Pain Assessment: Faces Faces Pain Scale: Hurts little more Pain Location: Reports moderate amount of LUE pain. Unable to rate on NPRS with questioning during evaluation Pain Intervention(s): Monitored during session    Home Living Family/patient expects to be discharged to:: Assisted living [facility])               Home Equipment: Gilford Rile - 2 wheels;Cane - single point;Grab bars - tub/shower (walk-in shower, no shower seat)      Prior Function Level of Independence: Needs assistance   Gait / Transfers Assistance Needed: Independent with ambulation without assistive device. Plays golf. Denies other falls in the last 12 months  ADL's / Homemaking Assistance Needed: Independent with ADLs but requires assist with IADLs such as medication management. Eats meals at facility        Hand Dominance   Dominant Hand: Right    Extremity/Trunk Assessment   Upper Extremity Assessment Upper Extremity Assessment: LUE deficits/detail LUE Deficits / Details: Bilateral UE strength appears symmetrical and WFL with the exception of some L grip strength weakness. Pt reports intact sensation but states that the weakness started after his fall subsequent LUE swelling    Lower Extremity Assessment Lower Extremity Assessment: Overall WFL for tasks assessed       Communication    Communication: HOH;Other (comment) (Very HOH, has hearing aids but won't wear for therapist)  Cognition Arousal/Alertness: Awake/alert Behavior During Therapy: WFL for tasks assessed/performed Overall Cognitive Status: Within Functional Limits for tasks assessed                                        General Comments      Exercises     Assessment/Plan    PT Assessment Patient needs continued PT services  PT Problem List Decreased balance;Decreased mobility       PT Treatment Interventions DME instruction;Gait training;Therapeutic activities;Therapeutic exercise;Balance training;Patient/family education;Neuromuscular re-education    PT Goals (Current goals can be found in the Care Plan section)  Acute Rehab PT Goals Patient Stated Goal: Return to prior function at home PT Goal Formulation: With patient Time For Goal Achievement: 07/26/17 Potential to Achieve Goals: Good    Frequency Min 2X/week   Barriers to discharge        Co-evaluation               AM-PAC PT "6 Clicks" Daily Activity  Outcome Measure Difficulty turning over in bed (including adjusting bedclothes, sheets and blankets)?:  A Little Difficulty moving from lying on back to sitting on the side of the bed? : Unable Difficulty sitting down on and standing up from a chair with arms (e.g., wheelchair, bedside commode, etc,.)?: None Help needed moving to and from a bed to chair (including a wheelchair)?: None Help needed walking in hospital room?: None Help needed climbing 3-5 steps with a railing? : A Little 6 Click Score: 19    End of Session Equipment Utilized During Treatment: Gait belt Activity Tolerance: Patient tolerated treatment well Patient left: in bed;with bed alarm set;with call bell/phone within reach   PT Visit Diagnosis: Unsteadiness on feet (R26.81)    Time: 3267-1245 PT Time Calculation (min) (ACUTE ONLY): 23 min   Charges:   PT Evaluation $PT Eval Low  Complexity: 1 Low PT Treatments $Gait Training: 8-22 mins   PT G Codes:   PT G-Codes **NOT FOR INPATIENT CLASS** Functional Assessment Tool Used: AM-PAC 6 Clicks Basic Mobility Functional Limitation: Mobility: Walking and moving around Mobility: Walking and Moving Around Current Status ([id number]): At least 20 percent but less than 40 percent impaired, limited or restricted Mobility: Walking and Moving Around Goal Status [phone]): At least 1 percent but less than 20 percent impaired, limited or restricted    Phillips Grout PT, DPT    [name] 07/12/2017, 10:52 AM

## 2017-07-12 NOTE — NC FL2 (Signed)
[place] LEVEL OF CARE SCREENING TOOL     IDENTIFICATION  Patient Name: FIELD STANISZEWSKI Birthdate: [birth year] Sex: male Admission Date (Current Location): 07/11/2017  [place] and [place] Number:  Engineering geologist and Address:  [facility], [address], [place], [place] [address]      Provider Number: [id number]  Attending Physician Name and Address:  Epifanio Lesches, MD  Relative Name and Phone Number:       Current Level of Care: Hospital Recommended Level of Care: [place] Prior Approval Number:    Date Approved/Denied:   PASRR Number:    Discharge Plan: Domiciliary (Rest home)    Current Diagnoses: Patient Active Problem List   Diagnosis Date Noted  . Cellulitis 07/12/2017  . Cellulitis of left elbow 07/11/2017  . Osteoarthritis of left shoulder 11/19/2016  . Osteoarthrosis involving multiple sites 09/10/2016  . Constipation 06/11/2016  . Osteoarthritis of both knees 03/19/2016  . Chronic kidney disease, stage III (moderate)   . S/P MVR (mitral valve repair) 03/28/2015  . Encounter for therapeutic drug monitoring 12/26/2013  . OSA on CPAP   . Chronic diastolic congestive heart failure ([place]) 03/20/2013  . S/P Maze operation for atrial fibrillation 03/15/2013  . MR (mitral regurgitation) 03/03/2013  . PAF (paroxysmal atrial fibrillation) ([place]) 03/03/2013  . Spinal stenosis, thoracic 03/03/2013  . AAA (abdominal aortic aneurysm) without rupture ([place]) 03/03/2013  . Mitral valve prolapse, nonrheumatic 01/24/2013    Orientation RESPIRATION BLADDER Height & Weight     Self, Time, Situation, Place  Normal Continent Weight: 168 lb 1.6 oz (76.2 kg) Height:  5\' 10"  (177.8 cm)  BEHAVIORAL SYMPTOMS/MOOD NEUROLOGICAL BOWEL NUTRITION STATUS   (none)  (none) Continent Diet (Diet: Heart Healthy )  AMBULATORY STATUS COMMUNICATION OF NEEDS Skin   Supervision Verbally Normal                        Personal Care Assistance Level of Assistance  Bathing, Feeding, Dressing Bathing Assistance: Limited assistance Feeding assistance: Independent Dressing Assistance: Limited assistance     Functional Limitations Info  Sight, Hearing, Speech Sight Info: Adequate Hearing Info: Impaired Speech Info: Adequate    SPECIAL CARE FACTORS FREQUENCY                       Contractures      Additional Factors Info  Code Status, Allergies Code Status Info:  (Full Code. ) Allergies Info:  (Statins, Latex. )           Current Medications (07/12/2017):  This is the current hospital active medication list Current Facility-Administered Medications  Medication Dose Route Frequency Provider Last Rate Last Dose  . 0.9 %  sodium chloride infusion  250 mL Intravenous PRN Hugelmeyer, Alexis, DO      . acetaminophen (TYLENOL) tablet 650 mg  650 mg Oral Q6H PRN Hugelmeyer, Alexis, DO       Or  . acetaminophen (TYLENOL) suppository 650 mg  650 mg Rectal Q6H PRN Hugelmeyer, Alexis, DO      . albuterol (PROVENTIL) (2.5 MG/3ML) 0.083% nebulizer solution 2.5 mg  2.5 mg Nebulization Q6H PRN Hugelmeyer, Alexis, DO      . bisacodyl (DULCOLAX) EC tablet 5 mg  5 mg Oral Daily PRN Hugelmeyer, Alexis, DO   5 mg at 07/11/17 2358  . cholecalciferol (VITAMIN D) tablet 1,000 Units  1,000 Units Oral q morning - 10a Hugelmeyer, Alexis, DO  1,000 Units at 07/12/17 1884  . docusate sodium (COLACE) capsule 100 mg  100 mg Oral BID Hugelmeyer, Alexis, DO   100 mg at 07/12/17 1660  . furosemide (LASIX) tablet 20 mg  20 mg Oral QODAY Hugelmeyer, Alexis, DO   20 mg at 07/12/17 0939  . ipratropium (ATROVENT) nebulizer solution 0.5 mg  0.5 mg Nebulization Q6H PRN Hugelmeyer, Alexis, DO      . loratadine (CLARITIN) tablet 10 mg  10 mg Oral Daily Hugelmeyer, Alexis, DO   10 mg at 07/12/17 0939  . magnesium citrate solution 1 Bottle  1 Bottle Oral Once PRN Hugelmeyer, Alexis, DO      . ondansetron (ZOFRAN) tablet 4 mg   4 mg Oral Q6H PRN Hugelmeyer, Alexis, DO       Or  . ondansetron (ZOFRAN) injection 4 mg  4 mg Intravenous Q6H PRN Hugelmeyer, Alexis, DO      . oxyCODONE (Oxy IR/ROXICODONE) immediate release tablet 5 mg  5 mg Oral Q4H PRN Hugelmeyer, Alexis, DO   5 mg at 07/12/17 1031  . polyethylene glycol (MIRALAX / GLYCOLAX) packet 17 g  17 g Oral Daily Hugelmeyer, Alexis, DO   17 g at 07/12/17 0939  . senna-docusate (Senokot-S) tablet 1 tablet  1 tablet Oral QHS PRN Hugelmeyer, Alexis, DO   1 tablet at 07/11/17 2359  . senna-docusate (Senokot-S) tablet 2 tablet  2 tablet Oral Daily Hugelmeyer, Alexis, DO   2 tablet at 07/12/17 [phone]  . sodium chloride (OCEAN) 0.65 % nasal spray 1 spray  1 spray Each Nare PRN Hugelmeyer, Alexis, DO      . sodium chloride flush (NS) 0.9 % injection 3 mL  3 mL Intravenous Q12H Hugelmeyer, Alexis, DO   3 mL at 07/12/17 1000  . sodium chloride flush (NS) 0.9 % injection 3 mL  3 mL Intravenous PRN Hugelmeyer, Alexis, DO      . vancomycin (VANCOCIN) IVPB 1000 mg/200 mL premix  1,000 mg Intravenous Q24H Hugelmeyer, Alexis, DO   Stopped at 07/12/17 0818  . [START ON 07/14/2017] warfarin (COUMADIN) tablet 2.5 mg  2.5 mg Oral Q M,W,F,Sa-1800 Hugelmeyer, Alexis, DO      . [START ON 07/13/2017] warfarin (COUMADIN) tablet 5 mg  5 mg Oral Q Tue-1800 Hugelmeyer, Alexis, DO       And  . [START ON 07/15/2017] warfarin (COUMADIN) tablet 5 mg  5 mg Oral Q Thu-1800 Hugelmeyer, Alexis, DO      . [START ON 07/18/2017] warfarin (COUMADIN) tablet 5 mg  5 mg Oral Q Sun-1800 Hugelmeyer, Alexis, DO      . Warfarin - Pharmacist Dosing Inpatient   Does not apply q1800 Hugelmeyer, [place], DO         Discharge Medications: Please see discharge summary for a list of discharge medications.  Relevant Imaging Results:  Relevant Lab Results:   Additional Information  (SSN: [id number])  Cassi Jenne, Veronia Beets, LCSW

## 2017-07-12 NOTE — Progress Notes (Signed)
ANTICOAGULATION CONSULT NOTE - Initial Consult  Pharmacy Consult for warfarin management Indication: atrial fibrillation/h/o mitral valve repair  Allergies  Allergen Reactions  . Statins     Muscle pains  . Latex Rash    bandaids--no other latex problems    Patient Measurements: Height: 5\' 10"  (177.8 cm) Weight: 168 lb 1.6 oz (76.2 kg) IBW/kg (Calculated) : 73 Heparin Dosing Weight: 79.4 kg  Vital Signs: Temp: 98.3 F (36.8 C) (08/20 0723) Temp Source: Oral (08/20 0723) BP: 131/63 (08/20 0723) Pulse Rate: 63 (08/20 0723)  Labs:  Recent Labs  07/09/17 2033 07/09/17 2257 07/11/17 1630 07/11/17 2023 07/12/17 0339  HGB 12.8*  --  13.4  --  12.5*  HCT 37.9*  --  39.7*  --  36.6*  PLT 207  --  220  --  194  LABPROT  --  34.9*  --  32.4* 33.7*  INR  --  3.37  --  3.07 3.23  CREATININE 1.53*  --  1.68*  --  1.41*    Estimated Creatinine Clearance: 38.8 mL/min (A) (by C-G formula based on SCr of 1.41 mg/dL (H)).   Medical History: Past Medical History:  Diagnosis Date  . AAA (abdominal aortic aneurysm) without rupture ([place]) 03/03/2013   Small, partially thrombosed saccular aneurysm  . Arthritis    HANDS  . Atrial fibrillation ([place]) 02/10/2013  . Cancer ([place])    SKIN CANCERS  . Chronic diastolic congestive heart failure ([place]) 03/20/2013  . Chronic kidney disease, stage III (moderate)   . Constipation   . Erectile dysfunction   . Hearing loss    BILATERAL - WEARS HEARING AIDS  . History of torn meniscus of left knee    PAINFUL LEFT KNEE  . Mitral valve prolapse   . Mitral valve regurgitation   . OSA on CPAP   . S/P Maze operation for atrial fibrillation 03/15/2013   Complete bilateral lesion set using cryothermy and bipolar radiofrequency ablation with oversewing of LA appendage via right mini thoracotomy  . Spinal stenosis, thoracic 03/03/2013   T5-6    Medications:  Scheduled:  . cholecalciferol  1,000 Units Oral q morning - 10a  . docusate sodium  100 mg  Oral BID  . furosemide  20 mg Oral QODAY  . loratadine  10 mg Oral Daily  . polyethylene glycol  17 g Oral Daily  . senna-docusate  2 tablet Oral Daily  . sodium chloride flush  3 mL Intravenous Q12H  . [START ON 07/14/2017] warfarin  2.5 mg Oral Q M,W,F,Sa-1800  . [START ON 07/13/2017] warfarin  5 mg Oral Q Tue-1800   And  . [START ON 07/15/2017] warfarin  5 mg Oral Q Thu-1800  . [START ON 07/18/2017] warfarin  5 mg Oral Q Sun-1800  . Warfarin - Pharmacist Dosing Inpatient   Does not apply q1800    Assessment: Patient admitted for LUE cellulitis s/p falling on a sand trap while golfing. Pt. Was given PO keflex and IV clindamycin in ER; however, cellulitis did not improve and continued to worsen. Pt. Has a h/o of afib and mitral valve repair. Per coumadin clinic INR goal is 2 - 3 for afib.  Warfarin regimen per coumadin clinic: Warfarin 5 mg Sun Tues Thurs Warfarin 2.5 mg all other days (MWFSat).  8/17 INR 3.37 8/19 INR 3.07 8/20 INR 3.23  Goal of Therapy:  INR 2-3 Monitor platelets by anticoagulation protocol: Yes   Plan:  Considering INR is slightly supratherapeutic 3.23 based on a  goal of 2 - 3, Will hold 8/20 dose and will restart patient on his home regimen 8/21. Will adjust doses based on INR trends. Will monitor daily INRs and CBC.  Thomasenia Sales, PharmD, MBA, [facility]    07/12/2017

## 2017-07-13 ENCOUNTER — Telehealth: Payer: Self-pay | Admitting: *Deleted

## 2017-07-13 LAB — PROTIME-INR
INR: 3.01
PROTHROMBIN TIME: 31.9 s — AB (ref 11.4–15.2)

## 2017-07-13 MED ORDER — LEVOFLOXACIN 500 MG PO TABS: 500.0000 mg | ORAL_TABLET | Freq: Every day | ORAL | 0 refills | Status: AC

## 2017-07-13 MED ORDER — FUROSEMIDE 20 MG PO TABS: 20.0000 mg | ORAL_TABLET | ORAL | 0 refills | Status: DC

## 2017-07-13 MED ORDER — LEVOFLOXACIN 500 MG PO TABS: 500.0000 mg | ORAL_TABLET | Freq: Every day | ORAL | 0 refills | Status: DC

## 2017-07-13 MED ORDER — SULFAMETHOXAZOLE-TRIMETHOPRIM 800-160 MG PO TABS: 1.0000 | ORAL_TABLET | Freq: Two times a day (BID) | ORAL | 0 refills | Status: DC

## 2017-07-13 NOTE — Progress Notes (Signed)
ANTICOAGULATION CONSULT NOTE - Initial Consult  Pharmacy Consult for warfarin management Indication: atrial fibrillation/h/o mitral valve repair  Allergies  Allergen Reactions  . Statins     Muscle pains  . Latex Rash    bandaids--no other latex problems    Patient Measurements: Height: 5\' 10"  (177.8 cm) Weight: 168 lb 1.6 oz (76.2 kg) IBW/kg (Calculated) : 73 Heparin Dosing Weight: 79.4 kg  Vital Signs: Temp: 98.6 F (37 C) (08/20 2053) Temp Source: Oral (08/20 2053) BP: 138/67 (08/20 2053) Pulse Rate: 75 (08/20 2053)  Labs:  Recent Labs  07/11/17 1630 07/11/17 2023 07/12/17 0339 07/13/17 0443  HGB 13.4  --  12.5*  --   HCT 39.7*  --  36.6*  --   PLT 220  --  194  --   LABPROT  --  32.4* 33.7* 31.9*  INR  --  3.07 3.23 3.01  CREATININE 1.68*  --  1.41*  --     Estimated Creatinine Clearance: 38.8 mL/min (A) (by C-G formula based on SCr of 1.41 mg/dL (H)).   Medical History: Past Medical History:  Diagnosis Date  . AAA (abdominal aortic aneurysm) without rupture ([place]) 03/03/2013   Small, partially thrombosed saccular aneurysm  . Arthritis    HANDS  . Atrial fibrillation ([place]) 02/10/2013  . Cancer ([place])    SKIN CANCERS  . Chronic diastolic congestive heart failure ([place]) 03/20/2013  . Chronic kidney disease, stage III (moderate)   . Constipation   . Erectile dysfunction   . Hearing loss    BILATERAL - WEARS HEARING AIDS  . History of torn meniscus of left knee    PAINFUL LEFT KNEE  . Mitral valve prolapse   . Mitral valve regurgitation   . OSA on CPAP   . S/P Maze operation for atrial fibrillation 03/15/2013   Complete bilateral lesion set using cryothermy and bipolar radiofrequency ablation with oversewing of LA appendage via right mini thoracotomy  . Spinal stenosis, thoracic 03/03/2013   T5-6    Medications:  Scheduled:  . cholecalciferol  1,000 Units Oral q morning - 10a  . docusate sodium  100 mg Oral BID  . furosemide  20 mg Oral QODAY  .  loratadine  10 mg Oral Daily  . polyethylene glycol  17 g Oral Daily  . senna-docusate  2 tablet Oral Daily  . sodium chloride flush  3 mL Intravenous Q12H  . [START ON 07/14/2017] warfarin  2.5 mg Oral Q M,W,F,Sa-1800  . warfarin  5 mg Oral Q Tue-1800   And  . [START ON 07/15/2017] warfarin  5 mg Oral Q Thu-1800  . [START ON 07/18/2017] warfarin  5 mg Oral Q Sun-1800  . Warfarin - Pharmacist Dosing Inpatient   Does not apply q1800    Assessment: Patient admitted for LUE cellulitis s/p falling on a sand trap while golfing. Pt. Was given PO keflex and IV clindamycin in ER; however, cellulitis did not improve and continued to worsen. Pt. Has a h/o of afib and mitral valve repair. Per coumadin clinic INR goal is 2 - 3 for afib.  Warfarin regimen per coumadin clinic: Warfarin 5 mg Sun Tues Thurs Warfarin 2.5 mg all other days (MWFSat).  8/17 INR 3.37 8/19 INR 3.07 8/20 INR 3.23 8/21 INR 3.01   Goal of Therapy:  INR 2-3 Monitor platelets by anticoagulation protocol: Yes   Plan:  Considering INR is slightly supratherapeutic 3.01 based on a goal of 2 - 3, Will hold 8/21 dose and will  restart patient on his home regimen 8/22. Will adjust doses based on INR trends. Will monitor daily INRs and CBC.    Thomasenia Sales, PharmD, MBA, [facility]    07/13/2017

## 2017-07-13 NOTE — Discharge Instructions (Signed)

## 2017-07-13 NOTE — Care Management Important Message (Signed)
Important Message  Patient Details  Name: [name] MRN: [id number] Date of Birth: [birth year]   Medicare Important Message Given:  Yes    Jolly Mango, RN 07/13/2017, 8:42 AM

## 2017-07-13 NOTE — NC FL2 (Signed)
[place] LEVEL OF CARE SCREENING TOOL     IDENTIFICATION  Patient Name: [name] Birthdate: [birth year] Sex: male Admission Date (Current Location): 07/11/2017  [place] and [place] Number:  Engineering geologist and Address:  [facility], [address] [place], [place] [address]      Provider Number: [id number]  Attending Physician Name and Address:  Epifanio Lesches, MD  Relative Name and Phone Number:       Current Level of Care: Hospital Recommended Level of Care: [place] Prior Approval Number:    Date Approved/Denied:   PASRR Number:    Discharge Plan: Domiciliary (Rest home)    Current Diagnoses: Patient Active Problem List   Diagnosis Date Noted  . Cellulitis 07/12/2017  . Cellulitis of left elbow 07/11/2017  . Osteoarthritis of left shoulder 11/19/2016  . Osteoarthrosis involving multiple sites 09/10/2016  . Constipation 06/11/2016  . Osteoarthritis of both knees 03/19/2016  . Chronic kidney disease, stage III (moderate)   . S/P MVR (mitral valve repair) 03/28/2015  . Encounter for therapeutic drug monitoring 12/26/2013  . OSA on CPAP   . Chronic diastolic congestive heart failure ([place]) 03/20/2013  . S/P Maze operation for atrial fibrillation 03/15/2013  . MR (mitral regurgitation) 03/03/2013  . PAF (paroxysmal atrial fibrillation) ([place]) 03/03/2013  . Spinal stenosis, thoracic 03/03/2013  . AAA (abdominal aortic aneurysm) without rupture ([place]) 03/03/2013  . Mitral valve prolapse, nonrheumatic 01/24/2013    Orientation RESPIRATION BLADDER Height & Weight     Self, Time, Situation, Place  Normal Continent Weight: 168 lb 1.6 oz (76.2 kg) Height:  5\' 10"  (177.8 cm)  BEHAVIORAL SYMPTOMS/MOOD NEUROLOGICAL BOWEL NUTRITION STATUS   (none)  (none) Continent Diet (Diet: Heart Healthy )  AMBULATORY STATUS COMMUNICATION OF NEEDS Skin   Supervision Verbally Normal                        Personal Care Assistance Level of Assistance  Bathing, Feeding, Dressing Bathing Assistance: Limited assistance Feeding assistance: Independent Dressing Assistance: Limited assistance     Functional Limitations Info  Sight, Hearing, Speech Sight Info: Adequate Hearing Info: Impaired Speech Info: Adequate    SPECIAL CARE FACTORS FREQUENCY                       Contractures      Additional Factors Info  Code Status, Allergies Code Status Info:  (Full Code. ) Allergies Info:  (Statins, Latex. )          Discharge Medications: Please see discharge summary for a list of discharge medications. Medication List     STOP taking these medications   amoxicillin 500 MG capsule Commonly known as:  AMOXIL   cephALEXin 250 MG capsule Commonly known as:  KEFLEX     TAKE these medications   cetirizine 10 MG tablet Commonly known as:  ZYRTEC Take 10 mg by mouth daily.   cholecalciferol 1000 units tablet Commonly known as:  VITAMIN D Take 1,000 Units by mouth every morning. Reported on 03/19/2016   cyanocobalamin 1000 MCG/ML injection Commonly known as:  (VITAMIN B-12) Inject 1,000 mcg into the muscle every 14 (fourteen) days.   docusate sodium 100 MG capsule Commonly known as:  COLACE Take 100 mg by mouth 2 (two) times daily.   furosemide 20 MG tablet Commonly known as:  LASIX Take 1 tablet (20 mg total) by mouth every other day. What changed:  See the new instructions.   GLUCOSAMINE CHONDR 500 COMPLEX Caps Take 2 capsules by mouth daily.   levofloxacin 500 MG tablet Commonly known as:  LEVAQUIN Take 1 tablet (500 mg total) by mouth daily.   polyethylene glycol packet Commonly known as:  MIRALAX / GLYCOLAX Take 17 g by mouth daily.   senna-docusate 8.6-50 MG tablet Commonly known as:  Senokot-S Take 2 tablets by mouth daily. What changed:  when to take this  reasons to take this   sodium chloride 0.65 % Soln nasal spray Commonly  known as:  OCEAN Place 1 spray into both nostrils as needed for congestion.   warfarin 5 MG tablet Commonly known as:  COUMADIN TAKE AS DIRECTED BY COUMADIN CLINIC   Relevant Imaging Results: Relevant Lab Results: Additional Information  (SSN: [id number])  Viridiana Spaid, Veronia Beets, LCSW

## 2017-07-13 NOTE — Discharge Summary (Signed)
[name], is a 83 y.o. male  DOB 01/30/1929  MRN [id number].  Admission date:  07/11/2017  Admitting Physician  Harvie Bridge, DO  Discharge Date:  07/13/2017   Primary MD  Venia Carbon, MD  Recommendations for primary care physician for things to follow:   Discharge back to [facility]. Follow-up with PCP in one week.   Admission Diagnosis  Left arm cellulitis [L03.114] Chronic kidney disease, unspecified CKD stage [N18.9]   Discharge Diagnosis  Left arm cellulitis [L03.114] Chronic kidney disease, unspecified CKD stage [N18.9]    Active Problems:   Cellulitis of left elbow   Cellulitis      Past Medical History:  Diagnosis Date  . AAA (abdominal aortic aneurysm) without rupture ([place]) 03/03/2013   Small, partially thrombosed saccular aneurysm  . Arthritis    HANDS  . Atrial fibrillation ([place]) 02/10/2013  . Cancer ([place])    SKIN CANCERS  . Chronic diastolic congestive heart failure ([place]) 03/20/2013  . Chronic kidney disease, stage III (moderate)   . Constipation   . Erectile dysfunction   . Hearing loss    BILATERAL - WEARS HEARING AIDS  . History of torn meniscus of left knee    PAINFUL LEFT KNEE  . Mitral valve prolapse   . Mitral valve regurgitation   . OSA on CPAP   . S/P Maze operation for atrial fibrillation 03/15/2013   Complete bilateral lesion set using cryothermy and bipolar radiofrequency ablation with oversewing of LA appendage via right mini thoracotomy  . Spinal stenosis, thoracic 03/03/2013   T5-6    Past Surgical History:  Procedure Laterality Date  . CARPAL TUNNEL RELEASE     BILATERAL  . [place]  . INTRAOPERATIVE TRANSESOPHAGEAL ECHOCARDIOGRAM Right 03/15/2013   Procedure: INTRAOPERATIVE TRANSESOPHAGEAL ECHOCARDIOGRAM;  Surgeon:  Rexene Alberts, MD;  Location: [place];  Service: Open Heart Surgery;  Laterality: Right;  . MINIMALLY INVASIVE MAZE PROCEDURE Right 03/15/2013   Procedure: MINIMALLY INVASIVE MAZE PROCEDURE;  Surgeon: Rexene Alberts, MD;  Location: [place] [facility];  Service: Open Heart Surgery;  Laterality: Right;  (R) AXILLARY CANNULATION  . MITRAL VALVE REPAIR Right 03/15/2013   Procedure: MINIMALLY INVASIVE MITRAL VALVE REPAIR (MVR);  Surgeon: Rexene Alberts, MD;  Location: [place];  Service: Open Heart Surgery;  Laterality: Right;  . RIGHT ROTATOR CUFF REPAIR    . SKIN CANCERS REMOVED FROM BOTH LEGS    . SKIN CANCERS REMOVED FROM LEFT FOREHEAD AND LEFT NOSE    . SURGERY LEFT NECK FOR REMOVAL OF METASTATIC LYMPH NODE ( FROM CANCER LEFT FOREHEAD)  AND LEFT NECK DISSECTION-REST OF LYMPH NODES NEGATIVE FOR ANY CANCER    . TEE WITHOUT CARDIOVERSION N/A 01/31/2013   Procedure: TRANSESOPHAGEAL ECHOCARDIOGRAM (TEE);  Surgeon: Sueanne Margarita, MD;  Location: [facility] ENDOSCOPY;  Service: Cardiovascular;  Laterality: N/A;       History of present illness and  Hospital Course:     Kindly see H&P for history of present illness and admission details, please review complete Labs, Consult reports and Test reports for all details in brief  HPI  from the history and physical done on the day of admission 83 year old male patient who is very hard of hearing admitted because of worsening cellulitis of the left elbow area. Failed outpatient therapy with Keflex. Patient heart his arm while he was on golf [place]. He was seen in emergency room 2 days ago and he was  discharged with Keflex. But his wound continued to get worse with redness, swelling, pain. So he is admitted for IV antibiotics, started on IV antibiotics with vancomycin.   Hospital Course  #1 left arm cellulitis with failed outpatient therapy: Admitted and started on IV vancomycin. Today he is much better, his wound improved, decreased swelling, redness. [facility] to [facility]  started living facility with his Levaquin 500 MG daily for 7 days.  #2. chronic kidney disease stage III: Stable. #3 .chronic diastolic heart failure: Stable no evidence of CHF during this admission.continue Lasix. #4 chronic atrial fibrillation: Patient is on Coumadin for anticoagulation, INR today is 3.01. Called her daughter [name] and updated that patient will be discharged today and she is agreeable for that.      Discharge Condition: stable   Follow UP  Follow-up Information    Viviana Simpler I, MD Follow up in 1 week(s).   Specialties:  Internal Medicine, Pediatrics Contact information: [place] [address] [phone]             Discharge Instructions  and  Discharge Medications     Allergies as of 07/13/2017      Reactions   Statins    Muscle pains   Latex Rash   bandaids--no other latex problems      Medication List    STOP taking these medications   amoxicillin 500 MG capsule Commonly known as:  AMOXIL   cephALEXin 250 MG capsule Commonly known as:  KEFLEX     TAKE these medications   cetirizine 10 MG tablet Commonly known as:  ZYRTEC Take 10 mg by mouth daily.   cholecalciferol 1000 units tablet Commonly known as:  VITAMIN D Take 1,000 Units by mouth every morning. Reported on 03/19/2016   cyanocobalamin 1000 MCG/ML injection Commonly known as:  (VITAMIN B-12) Inject 1,000 mcg into the muscle every 14 (fourteen) days.   docusate sodium 100 MG capsule Commonly known as:  COLACE Take 100 mg by mouth 2 (two) times daily.   furosemide 20 MG tablet Commonly known as:  LASIX Take 1 tablet (20 mg total) by mouth every other day. What changed:  See the new instructions.   GLUCOSAMINE CHONDR 500 COMPLEX Caps Take 2 capsules by mouth daily.   levofloxacin 500 MG tablet Commonly known as:  LEVAQUIN Take 1 tablet (500 mg total) by mouth daily.   polyethylene glycol packet Commonly known as:  MIRALAX /  GLYCOLAX Take 17 g by mouth daily.   senna-docusate 8.6-50 MG tablet Commonly known as:  Senokot-S Take 2 tablets by mouth daily. What changed:  when to take this  reasons to take this   sodium chloride 0.65 % Soln nasal spray Commonly known as:  OCEAN Place 1 spray into both nostrils as needed for congestion.   warfarin 5 MG tablet Commonly known as:  COUMADIN TAKE AS DIRECTED BY COUMADIN CLINIC         Diet and Activity recommendation: See Discharge Instructions above   Consults obtained - PT   Major procedures and Radiology Reports - PLEASE review detailed and final reports for all details, in brief -      Dg Chest 2 View  Result Date: 07/09/2017 CLINICAL DATA:  Syncope and rib pain EXAM: CHEST  2 VIEW COMPARISON:  Chest radiograph 03/27/2013 FINDINGS: There is cardiomegaly with sequelae of mitral valve repair. No pulmonary edema or focal consolidation. Surgical clips overlie the right lung apex. No pleural effusion or  pneumothorax. No rib fracture. IMPRESSION: Cardiomegaly without focal airspace disease.  No rib fracture. Electronically Signed   By: Ulyses Jarred M.D.   On: 07/09/2017 23:00    Micro Results     No results found for this or any previous visit (from the past 240 hour(s)).     Today   Subjective:   Pascual Mantel today Is seen, improved left arm cellulitis. Increased range of motion. Decreased redness, swelling. She is stable for discharge.  Objective:   Blood pressure 138/67, pulse 75, temperature 98.6 F (37 C), temperature source Oral, resp. rate 18, height 5\' 10"  (1.778 m), weight 76.2 kg (168 lb 1.6 oz), SpO2 97 %.   Intake/Output Summary (Last 24 hours) at 07/13/17 0810 Last data filed at 07/12/17 1800  Gross per 24 hour  Intake              680 ml  Output                1 ml  Net              679 ml    Exam Awake Alert, Oriented x 3, No new F.N deficits, Normal affect [place].AT,PERRAL Supple Neck,No JVD, No cervical  lymphadenopathy appriciated.  Symmetrical Chest wall movement, Good air movement bilaterally, CTAB RRR,No Gallops,Rubs or new Murmurs, No Parasternal Heave +ve B.Sounds, Abd Soft, Non tender, No organomegaly appriciated, No rebound -guarding or rigidity. No Cyanosis, Clubbing or edema, No new Rash or bruise  Data Review   CBC w Diff: Lab Results  Component Value Date   WBC 5.8 07/12/2017   HGB 12.5 (L) 07/12/2017   HCT 36.6 (L) 07/12/2017   PLT 194 07/12/2017   LYMPHOPCT 19 07/11/2017   MONOPCT 12 07/11/2017   EOSPCT 5 07/11/2017   BASOPCT 1 07/11/2017    CMP: Lab Results  Component Value Date   NA 141 07/12/2017   K 3.9 07/12/2017   CL 105 07/12/2017   CO2 29 07/12/2017   BUN 21 (H) 07/12/2017   CREATININE 1.41 (H) 07/12/2017   CREATININE 1.71 (H) 10/15/2015   PROT 7.2 03/13/2013   ALBUMIN 3.8 03/13/2013   BILITOT 0.6 03/13/2013   ALKPHOS 60 03/13/2013   AST 30 03/13/2013   ALT 35 03/13/2013  .   Total Time in preparing paper work, data evaluation and todays exam - 87 minutes  Wiley Magan M.D on 07/13/2017 at 8:10 AM    Note: This dictation was prepared with Dragon dictation along with smaller phrase technology. Any transcriptional errors that result from this process are unintentional.

## 2017-07-13 NOTE — Telephone Encounter (Signed)
Pt was on the TCM report was admitted 07/11/17 for chronic kidney disease. Pt was D/C 07/13/17 to [facility] facility and since he lives in nursing home Per Corene Cornea they do not need to come in for a visit..../lmb (see msg below)  Since that pt lives in a nursing home, they do not need to come in for a visit. Not sure how they ended up on the TCM list.  Thanks, Corene Cornea

## 2017-07-13 NOTE — Progress Notes (Signed)
Patient discharged to [facility] via daughter in private vehicle. Report called to Luellen Pucker, RN at [facility]. Prescription for levaquin sent with daughter. Luellen Pucker notified of dose change for lasix. IV removed without complications. Dressing changed to LFA prior to discharge.

## 2017-07-13 NOTE — Progress Notes (Signed)
Patient is medically stable for D/C back to [facility] today. Per Laurence Aly at [facility] patient can return today. Clinical Education officer, museum (CSW) sent Luellen Pucker D/C Summary and FL2. RN called report to [facility]. Patient's family member transported patient back to [facility]. Please reconsult if future social work needs arise. CSW signing off.   [employer], LCSW [phone]

## 2017-07-15 ENCOUNTER — Encounter: Payer: Self-pay | Admitting: Internal Medicine

## 2017-07-15 ENCOUNTER — Ambulatory Visit: Payer: Self-pay

## 2017-07-15 DIAGNOSIS — I480 Paroxysmal atrial fibrillation: Secondary | ICD-10-CM

## 2017-07-15 DIAGNOSIS — Z5181 Encounter for therapeutic drug level monitoring: Secondary | ICD-10-CM

## 2017-07-15 LAB — POCT INR: INR: 3

## 2017-07-15 LAB — PROTIME-INR

## 2017-07-19 ENCOUNTER — Encounter: Payer: Self-pay | Admitting: Internal Medicine

## 2017-07-19 LAB — PROTIME-INR

## 2017-07-19 LAB — POCT INR: INR: 3.3

## 2017-07-21 ENCOUNTER — Ambulatory Visit: Payer: Self-pay

## 2017-07-21 DIAGNOSIS — Z5181 Encounter for therapeutic drug level monitoring: Secondary | ICD-10-CM

## 2017-07-21 DIAGNOSIS — I480 Paroxysmal atrial fibrillation: Secondary | ICD-10-CM

## 2017-07-21 NOTE — Patient Instructions (Signed)
Pre visit review using our clinic review tool, if applicable. No additional management support is needed unless otherwise documented below in the visit note. 

## 2017-07-22 ENCOUNTER — Non-Acute Institutional Stay: Payer: Medicare Other | Admitting: Internal Medicine

## 2017-07-22 ENCOUNTER — Encounter: Payer: Self-pay | Admitting: Internal Medicine

## 2017-07-22 ENCOUNTER — Ambulatory Visit: Payer: Self-pay

## 2017-07-22 VITALS — BP 116/72 | HR 76 | Temp 98.1°F | Resp 16 | Wt 174.6 lb

## 2017-07-22 DIAGNOSIS — L03114 Cellulitis of left upper limb: Secondary | ICD-10-CM

## 2017-07-22 DIAGNOSIS — Z5181 Encounter for therapeutic drug level monitoring: Secondary | ICD-10-CM

## 2017-07-22 DIAGNOSIS — I480 Paroxysmal atrial fibrillation: Secondary | ICD-10-CM

## 2017-07-22 DIAGNOSIS — K5900 Constipation, unspecified: Secondary | ICD-10-CM

## 2017-07-22 LAB — PROTIME-INR

## 2017-07-22 LAB — POCT INR: INR: 3

## 2017-07-22 NOTE — Patient Instructions (Signed)
Pre visit review using our clinic review tool, if applicable. No additional management support is needed unless otherwise documented below in the visit note. 

## 2017-07-22 NOTE — Assessment & Plan Note (Signed)
Complains more about this Will change colace to senna-s

## 2017-07-22 NOTE — Progress Notes (Signed)
Subjective:    Patient ID: [name], male    DOB: 06/20/1929, 83 y.o.   MRN: [id number]  HPI Follow up in assisted living after hospitalization for left arm celllulitis Came back 1 week ago Doing well overall Reviewed status with Luellen Pucker RN here  Original injury when he went to [facility] to play golf Hit a small bucket Then started playing While in sand trap--felt funny in the head and started falling backwards Put left arm back to cushion fall and he hit the bank of the trap and gashed his arm  Seen here Cleaned out by staff here Seen by Rollene Fare NP and put on keflex To ER when arm became more infected Reviewed discharge summary Done with outpatient levaquin (protime reviewed on this)  He feels he is doing well Much better No pain  Current Outpatient Prescriptions on File Prior to Visit  Medication Sig Dispense Refill  . cetirizine (ZYRTEC) 10 MG tablet Take 10 mg by mouth daily.     . cholecalciferol (VITAMIN D) 1000 UNITS tablet Take 1,000 Units by mouth every morning. Reported on 03/19/2016    . cyanocobalamin (,VITAMIN B-12,) 1000 MCG/ML injection Inject 1,000 mcg into the muscle every 14 (fourteen) days.     Marland Kitchen docusate sodium (COLACE) 100 MG capsule Take 100 mg by mouth 2 (two) times daily.    . furosemide (LASIX) 20 MG tablet Take 1 tablet (20 mg total) by mouth every other day. 30 tablet 0  . Glucosamine-Chondroit-Vit C-Mn (GLUCOSAMINE CHONDR 500 COMPLEX) CAPS Take 2 capsules by mouth daily.     . polyethylene glycol (MIRALAX / GLYCOLAX) packet Take 17 g by mouth daily.    Marland Kitchen senna-docusate (SENOKOT-S) 8.6-50 MG tablet Take 2 tablets by mouth daily. (Patient taking differently: Take 2 tablets by mouth daily as needed. ) 60 tablet 11  . sodium chloride (OCEAN) 0.65 % SOLN nasal spray Place 1 spray into both nostrils as needed for congestion.    Marland Kitchen warfarin (COUMADIN) 5 MG tablet TAKE AS DIRECTED BY COUMADIN CLINIC 30 tablet 3   No current facility-administered  medications on file prior to visit.     Allergies  Allergen Reactions  . Statins     Muscle pains  . Latex Rash    bandaids--no other latex problems    Past Medical History:  Diagnosis Date  . AAA (abdominal aortic aneurysm) without rupture ([place]) 03/03/2013   Small, partially thrombosed saccular aneurysm  . Arthritis    HANDS  . Atrial fibrillation ([place]) 02/10/2013  . Cancer ([place])    SKIN CANCERS  . Chronic diastolic congestive heart failure ([place]) 03/20/2013  . Chronic kidney disease, stage III (moderate)   . Constipation   . Erectile dysfunction   . Hearing loss    BILATERAL - WEARS HEARING AIDS  . History of torn meniscus of left knee    PAINFUL LEFT KNEE  . Mitral valve prolapse   . Mitral valve regurgitation   . OSA on CPAP   . S/P Maze operation for atrial fibrillation 03/15/2013   Complete bilateral lesion set using cryothermy and bipolar radiofrequency ablation with oversewing of LA appendage via right mini thoracotomy  . Spinal stenosis, thoracic 03/03/2013   T5-6    Past Surgical History:  Procedure Laterality Date  . CARPAL TUNNEL RELEASE     BILATERAL  . [place]  . INTRAOPERATIVE TRANSESOPHAGEAL ECHOCARDIOGRAM Right 03/15/2013   Procedure: INTRAOPERATIVE TRANSESOPHAGEAL ECHOCARDIOGRAM;  Surgeon: Valentina Gu  Roxy Manns, MD;  Location: [place];  Service: Open Heart Surgery;  Laterality: Right;  . MINIMALLY INVASIVE MAZE PROCEDURE Right 03/15/2013   Procedure: MINIMALLY INVASIVE MAZE PROCEDURE;  Surgeon: Rexene Alberts, MD;  Location: [place];  Service: Open Heart Surgery;  Laterality: Right;  (R) AXILLARY CANNULATION  . MITRAL VALVE REPAIR Right 03/15/2013   Procedure: MINIMALLY INVASIVE MITRAL VALVE REPAIR (MVR);  Surgeon: Rexene Alberts, MD;  Location: [place];  Service: Open Heart Surgery;  Laterality: Right;  . RIGHT ROTATOR CUFF REPAIR    . SKIN CANCERS REMOVED FROM BOTH LEGS    . SKIN CANCERS REMOVED FROM LEFT FOREHEAD AND LEFT NOSE    . SURGERY  LEFT NECK FOR REMOVAL OF METASTATIC LYMPH NODE ( FROM CANCER LEFT FOREHEAD)  AND LEFT NECK DISSECTION-REST OF LYMPH NODES NEGATIVE FOR ANY CANCER    . TEE WITHOUT CARDIOVERSION N/A 01/31/2013   Procedure: TRANSESOPHAGEAL ECHOCARDIOGRAM (TEE);  Surgeon: Sueanne Margarita, MD;  Location: [facility] ENDOSCOPY;  Service: Cardiovascular;  Laterality: N/A;    Family History  Problem Relation Age of Onset  . Coronary artery disease Father   . Heart disease Father   . Heart attack Father   . Diabetes Mother   . Arthritis Mother   . Congestive Heart Failure Mother   . Alzheimer's disease Mother   . Alzheimer's disease Sister     Social History   Social History  . Marital status: Widowed    Spouse name: N/A  . Number of children: 2  . Years of education: N/A   Occupational History  . retired Art gallery manager    Social History Main Topics  . Smoking status: Former Smoker    Packs/day: 1.00    Years: 20.00    Start date: 11/23/1960  . Smokeless tobacco: Never Used     Comment: [place]  . Alcohol use 4.2 oz/week    7 Standard drinks or equivalent per week     Comment: Cocktail or glass of wine with dinner  . Drug use: No  . Sexual activity: Not on file   Other Topics Concern  . Not on file   Social History Narrative   Has living will   Daughter [name] is health care POA   Would accept resuscitation attempts but no prolonged ventilation   Not sure about tube feeds   Review of Systems  No fever Some left rib pain from the fall also--but not bad Appetite is okay     Objective:   Physical Exam  Constitutional: He appears well-nourished. No distress.  Skin:  Skin tear along extensor left elbow Proximal portion has granulation Distal part still open but no infection or sig inflammation  Psychiatric: He has a normal mood and affect. His behavior is normal.          Assessment & Plan:

## 2017-07-22 NOTE — Assessment & Plan Note (Signed)
Improved now Done with the antibiotics Wound still healing but seems to be improving Continue just antibiotic ointment till fully granulated

## 2017-08-05 ENCOUNTER — Ambulatory Visit: Payer: Self-pay

## 2017-08-05 DIAGNOSIS — I480 Paroxysmal atrial fibrillation: Secondary | ICD-10-CM

## 2017-08-05 DIAGNOSIS — Z5181 Encounter for therapeutic drug level monitoring: Secondary | ICD-10-CM

## 2017-08-05 LAB — POCT INR: INR: 4.5

## 2017-08-05 NOTE — Patient Instructions (Signed)
Pre visit review using our clinic review tool, if applicable. No additional management support is needed unless otherwise documented below in the visit note. 

## 2017-08-12 ENCOUNTER — Ambulatory Visit: Payer: Self-pay

## 2017-08-12 DIAGNOSIS — I480 Paroxysmal atrial fibrillation: Secondary | ICD-10-CM

## 2017-08-12 DIAGNOSIS — Z5181 Encounter for therapeutic drug level monitoring: Secondary | ICD-10-CM

## 2017-08-12 LAB — POCT INR: INR: 1.5

## 2017-08-12 NOTE — Patient Instructions (Signed)
Pre visit review using our clinic review tool, if applicable. No additional management support is needed unless otherwise documented below in the visit note. 

## 2017-08-19 ENCOUNTER — Encounter: Payer: Self-pay | Admitting: Internal Medicine

## 2017-08-19 ENCOUNTER — Ambulatory Visit: Payer: Self-pay

## 2017-08-19 DIAGNOSIS — Z5181 Encounter for therapeutic drug level monitoring: Secondary | ICD-10-CM

## 2017-08-19 DIAGNOSIS — I480 Paroxysmal atrial fibrillation: Secondary | ICD-10-CM

## 2017-08-19 LAB — POCT INR: INR: 3.2

## 2017-08-19 LAB — PROTIME-INR

## 2017-08-19 NOTE — Patient Instructions (Signed)
Pre visit review using our clinic review tool, if applicable. No additional management support is needed unless otherwise documented below in the visit note. 

## 2017-08-26 ENCOUNTER — Ambulatory Visit: Payer: Self-pay

## 2017-08-26 DIAGNOSIS — I480 Paroxysmal atrial fibrillation: Secondary | ICD-10-CM

## 2017-08-26 DIAGNOSIS — Z5181 Encounter for therapeutic drug level monitoring: Secondary | ICD-10-CM

## 2017-08-26 LAB — POCT INR: INR: 2.1

## 2017-09-06 ENCOUNTER — Ambulatory Visit: Payer: Self-pay

## 2017-09-06 ENCOUNTER — Encounter: Payer: Self-pay | Admitting: Internal Medicine

## 2017-09-06 DIAGNOSIS — I480 Paroxysmal atrial fibrillation: Secondary | ICD-10-CM

## 2017-09-06 DIAGNOSIS — Z5181 Encounter for therapeutic drug level monitoring: Secondary | ICD-10-CM

## 2017-09-06 DIAGNOSIS — Z7901 Long term (current) use of anticoagulants: Secondary | ICD-10-CM

## 2017-09-06 LAB — POCT INR: INR: 2.8

## 2017-09-06 LAB — PROTIME-INR

## 2017-09-07 DIAGNOSIS — Z7901 Long term (current) use of anticoagulants: Secondary | ICD-10-CM | POA: Insufficient documentation

## 2017-09-07 LAB — POCT INR: INR: 2.8

## 2017-09-07 NOTE — Patient Instructions (Signed)
Pre visit review using our clinic review tool, if applicable. No additional management support is needed unless otherwise documented below in the visit note. 

## 2017-09-10 ENCOUNTER — Encounter: Payer: Self-pay | Admitting: Internal Medicine

## 2017-09-10 ENCOUNTER — Ambulatory Visit: Payer: Medicare Other | Admitting: Internal Medicine

## 2017-09-10 VITALS — BP 119/69 | HR 65 | Resp 15 | Wt 178.2 lb

## 2017-09-10 DIAGNOSIS — M10042 Idiopathic gout, left hand: Secondary | ICD-10-CM

## 2017-09-10 NOTE — Progress Notes (Signed)
Subjective:    Patient ID: [name], male    DOB: [birth year], 83 y.o.   MRN: [id number]  HPI  Asked to see resident in [address] C/o left middle finger pain, swelling and redness, started 2 days ago Pain with bending finger, pain keeps him up at night. No known injury to the area, no history of gout He has been taking Tylenol with minimal relief.  Review of Systems  Past Medical History:  Diagnosis Date  . AAA (abdominal aortic aneurysm) without rupture ([place]) 03/03/2013   Small, partially thrombosed saccular aneurysm  . Arthritis    HANDS  . Atrial fibrillation ([place]) 02/10/2013  . Cancer ([place])    SKIN CANCERS  . Chronic diastolic congestive heart failure ([place]) 03/20/2013  . Chronic kidney disease, stage III (moderate) (HCC)   . Constipation   . Erectile dysfunction   . Hearing loss    BILATERAL - WEARS HEARING AIDS  . History of torn meniscus of left knee    PAINFUL LEFT KNEE  . Mitral valve prolapse   . Mitral valve regurgitation   . OSA on CPAP   . S/P Maze operation for atrial fibrillation 03/15/2013   Complete bilateral lesion set using cryothermy and bipolar radiofrequency ablation with oversewing of LA appendage via right mini thoracotomy  . Spinal stenosis, thoracic 03/03/2013   T5-6    Current Outpatient Prescriptions  Medication Sig Dispense Refill  . cetirizine (ZYRTEC) 10 MG tablet Take 10 mg by mouth daily.     . cholecalciferol (VITAMIN D) 1000 UNITS tablet Take 1,000 Units by mouth every morning. Reported on 03/19/2016    . cyanocobalamin (,VITAMIN B-12,) 1000 MCG/ML injection Inject 1,000 mcg into the muscle every 14 (fourteen) days.     Marland Kitchen docusate sodium (COLACE) 100 MG capsule Take 100 mg by mouth 2 (two) times daily.    . furosemide (LASIX) 20 MG tablet Take 1 tablet (20 mg total) by mouth every other day. 30 tablet 0  . Glucosamine-Chondroit-Vit C-Mn (GLUCOSAMINE CHONDR 500 COMPLEX) CAPS Take 2 capsules by mouth daily.     . polyethylene glycol  (MIRALAX / GLYCOLAX) packet Take 17 g by mouth daily.    Marland Kitchen senna-docusate (SENOKOT-S) 8.6-50 MG tablet Take 2 tablets by mouth daily. (Patient taking differently: Take 2 tablets by mouth daily as needed. ) 60 tablet 11  . sodium chloride (OCEAN) 0.65 % SOLN nasal spray Place 1 spray into both nostrils as needed for congestion.    Marland Kitchen warfarin (COUMADIN) 5 MG tablet TAKE AS DIRECTED BY COUMADIN CLINIC 30 tablet 3   No current facility-administered medications for this visit.     Allergies  Allergen Reactions  . Statins     Muscle pains  . Latex Rash    bandaids--no other latex problems    Family History  Problem Relation Age of Onset  . Coronary artery disease Father   . Heart disease Father   . Heart attack Father   . Diabetes Mother   . Arthritis Mother   . Congestive Heart Failure Mother   . Alzheimer's disease Mother   . Alzheimer's disease Sister     Social History   Social History  . Marital status: Widowed    Spouse name: N/A  . Number of children: 2  . Years of education: N/A   Occupational History  . retired Art gallery manager    Social History Main Topics  . Smoking status: Former Smoker    Packs/day: 1.00  Years: 20.00    Start date: 11/23/1960  . Smokeless tobacco: Never Used     Comment: [place]  . Alcohol use 4.2 oz/week    7 Standard drinks or equivalent per week     Comment: Cocktail or glass of wine with dinner  . Drug use: No  . Sexual activity: Not on file   Other Topics Concern  . Not on file   Social History Narrative   Has living will   Daughter [name] is health care POA   Would accept resuscitation attempts but no prolonged ventilation   Not sure about tube feeds     Constitutional: Denies fever, malaise, fatigue, headache or abrupt weight changes.  Musculoskeletal: Pt reports finger pain and swelling. Denies decrease in range of motion, difficulty with gait, muscle pain.    No other specific complaints in a complete  review of systems (except as listed in HPI above).     Objective:   Physical Exam  BP 119/69   Pulse 65   Resp 15   Wt 178 lb 3.2 oz (80.8 kg)   SpO2 98%   BMI 25.57 kg/m  Wt Readings from Last 3 Encounters:  09/10/17 178 lb 3.2 oz (80.8 kg)  07/22/17 174 lb 9.6 oz (79.2 kg)  07/11/17 168 lb 1.6 oz (76.2 kg)    General: Appears his stated age, in NAD. Skin: Redness and warmth noted of PIP, left middle finger. Musculoskeletal: Decreased flexion of the left middle finger. Swelling noted of the whole finger but most notably over the PIP , left middle finger.   BMET    Component Value Date/Time   NA 141 07/12/2017 0339   K 3.9 07/12/2017 0339   CL 105 07/12/2017 0339   CO2 29 07/12/2017 0339   GLUCOSE 82 07/12/2017 0339   BUN 21 (H) 07/12/2017 0339   CREATININE 1.41 (H) 07/12/2017 0339   CREATININE 1.71 (H) 10/15/2015 1050   CALCIUM 9.0 07/12/2017 0339   GFRNONAA 44 (L) 07/12/2017 0339   GFRAA 50 (L) 07/12/2017 0339    Lipid Panel  No results found for: CHOL, TRIG, HDL, CHOLHDL, VLDL, LDLCALC  CBC    Component Value Date/Time   WBC 5.8 07/12/2017 0339   RBC 4.05 (L) 07/12/2017 0339   HGB 12.5 (L) 07/12/2017 0339   HCT 36.6 (L) 07/12/2017 0339   PLT 194 07/12/2017 0339   MCV 90.4 07/12/2017 0339   MCH 30.9 07/12/2017 0339   MCHC 34.2 07/12/2017 0339   RDW 13.6 07/12/2017 0339   LYMPHSABS 1.2 07/11/2017 1630   MONOABS 0.7 07/11/2017 1630   EOSABS 0.3 07/11/2017 1630   BASOSABS 0.1 07/11/2017 1630    Hgb A1C Lab Results  Component Value Date   HGBA1C 5.7 (H) 03/13/2013            Assessment & Plan:   Gout of PIP of Left Middle Finger:  eRx for Pred x 6 days Check uric acid level in 1 week Consider adding Colchicine if uric acid level high  Will reassess as needed Webb Silversmith, NP

## 2017-09-10 NOTE — Patient Instructions (Signed)

## 2017-09-27 ENCOUNTER — Ambulatory Visit: Payer: Self-pay

## 2017-09-27 ENCOUNTER — Encounter: Payer: Self-pay | Admitting: Internal Medicine

## 2017-09-27 DIAGNOSIS — I480 Paroxysmal atrial fibrillation: Secondary | ICD-10-CM

## 2017-09-27 DIAGNOSIS — Z7901 Long term (current) use of anticoagulants: Secondary | ICD-10-CM

## 2017-09-28 LAB — POCT INR
INR: 2.6
INR: 2.6
INR: 2.6
INR: 2.6

## 2017-09-30 LAB — POCT INR
INR: 2.6
INR: 2.6
INR: 2.6

## 2017-09-30 NOTE — Patient Instructions (Signed)
Pre visit review using our clinic review tool, if applicable. No additional management support is needed unless otherwise documented below in the visit note. 

## 2017-10-06 ENCOUNTER — Encounter: Payer: Self-pay | Admitting: Internal Medicine

## 2017-10-06 ENCOUNTER — Ambulatory Visit: Payer: Medicare Other | Admitting: Internal Medicine

## 2017-10-06 VITALS — BP 138/64 | HR 76 | Temp 98.6°F | Resp 18 | Wt 174.0 lb

## 2017-10-06 DIAGNOSIS — M1A3420 Chronic gout due to renal impairment, left hand, without tophus (tophi): Secondary | ICD-10-CM

## 2017-10-06 DIAGNOSIS — M109 Gout, unspecified: Secondary | ICD-10-CM | POA: Insufficient documentation

## 2017-10-06 DIAGNOSIS — I480 Paroxysmal atrial fibrillation: Secondary | ICD-10-CM

## 2017-10-06 DIAGNOSIS — I5032 Chronic diastolic (congestive) heart failure: Secondary | ICD-10-CM

## 2017-10-06 DIAGNOSIS — I714 Abdominal aortic aneurysm, without rupture, unspecified: Secondary | ICD-10-CM

## 2017-10-06 DIAGNOSIS — N183 Chronic kidney disease, stage 3 unspecified: Secondary | ICD-10-CM

## 2017-10-06 DIAGNOSIS — S51002D Unspecified open wound of left elbow, subsequent encounter: Secondary | ICD-10-CM

## 2017-10-06 NOTE — Assessment & Plan Note (Signed)
Monitor yearly creatinine

## 2017-10-06 NOTE — Assessment & Plan Note (Signed)
Asymptomatic  Monitor

## 2017-10-06 NOTE — Assessment & Plan Note (Signed)
Now on Allopurinol Monitor uric acid levels

## 2017-10-06 NOTE — Assessment & Plan Note (Signed)
Compensated Continue Lasix every other day

## 2017-10-06 NOTE — Assessment & Plan Note (Signed)
Continue Coumadin Follow INR's

## 2017-10-06 NOTE — Progress Notes (Signed)
Subjective:    Patient ID: [name], male    DOB: Apr 29, 1929, 83 y.o.   MRN: [id number]  HPI  Routine follow up for resident in [address]. Reviewed with Luellen Pucker, RN. 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 Dermatology will not excise because he currently also has a fungal infection of that area that they want cleared up first. He has been putting Ketoconazole Cream to affected area BID.  AAA: Asymptomatic.  Arthritis: He has recently developed gout in his left hand. He has been started on Allopurinol. He used Colchicine for an acute episode but reports it is too expensive to take all the time.   Afib: Chronic. No issues on Eliquis.  CHF, Diastolic: He denies shortness of breath or lower extremity edema. He takes Lasix every other day.   CKD, stage 3: Creatinine 1.41  Review of Systems      Past Medical History:  Diagnosis Date  . AAA (abdominal aortic aneurysm) without rupture ([place]) 03/03/2013   Small, partially thrombosed saccular aneurysm  . Arthritis    HANDS  . Atrial fibrillation ([place]) 02/10/2013  . Cancer ([place])    SKIN CANCERS  . Chronic diastolic congestive heart failure ([place]) 03/20/2013  . Chronic kidney disease, stage III (moderate) (HCC)   . Constipation   . Erectile dysfunction   . Hearing loss    BILATERAL - WEARS HEARING AIDS  . History of torn meniscus of left knee    PAINFUL LEFT KNEE  . Mitral valve prolapse   . Mitral valve regurgitation   . OSA on CPAP   . S/P Maze operation for atrial fibrillation 03/15/2013   Complete bilateral lesion set using cryothermy and bipolar radiofrequency ablation with oversewing of LA appendage via right mini thoracotomy  . Spinal stenosis, thoracic 03/03/2013   T5-6    Current Outpatient Medications  Medication Sig Dispense Refill  . cetirizine (ZYRTEC) 10 MG tablet Take 10 mg by mouth daily.     . cholecalciferol (VITAMIN D) 1000 UNITS tablet Take 1,000 Units by mouth every morning.  Reported on 03/19/2016    . cyanocobalamin (,VITAMIN B-12,) 1000 MCG/ML injection Inject 1,000 mcg into the muscle every 14 (fourteen) days.     Marland Kitchen docusate sodium (COLACE) 100 MG capsule Take 100 mg by mouth 2 (two) times daily.    . furosemide (LASIX) 20 MG tablet Take 1 tablet (20 mg total) by mouth every other day. 30 tablet 0  . Glucosamine-Chondroit-Vit C-Mn (GLUCOSAMINE CHONDR 500 COMPLEX) CAPS Take 2 capsules by mouth daily.     . polyethylene glycol (MIRALAX / GLYCOLAX) packet Take 17 g by mouth daily.    Marland Kitchen senna-docusate (SENOKOT-S) 8.6-50 MG tablet Take 2 tablets by mouth daily. (Patient taking differently: Take 2 tablets by mouth daily as needed. ) 60 tablet 11  . sodium chloride (OCEAN) 0.65 % SOLN nasal spray Place 1 spray into both nostrils as needed for congestion.    Marland Kitchen warfarin (COUMADIN) 5 MG tablet TAKE AS DIRECTED BY COUMADIN CLINIC 30 tablet 3   No current facility-administered medications for this visit.     Allergies  Allergen Reactions  . Statins     Muscle pains  . Latex Rash    bandaids--no other latex problems    Family History  Problem Relation Age of Onset  . Coronary artery disease Father   . Heart disease Father   . Heart attack Father   . Diabetes Mother   .  Arthritis Mother   . Congestive Heart Failure Mother   . Alzheimer's disease Mother   . Alzheimer's disease Sister     Social History   Socioeconomic History  . Marital status: Widowed    Spouse name: Not on file  . Number of children: 2  . Years of education: Not on file  . Highest education level: Not on file  Social Needs  . Financial resource strain: Not on file  . Food insecurity - worry: Not on file  . Food insecurity - inability: Not on file  . Transportation needs - medical: Not on file  . Transportation needs - non-medical: Not on file  Occupational History  . Occupation: retired Art gallery manager  Tobacco Use  . Smoking status: Former Smoker    Packs/day: 1.00    Years:  20.00    Pack years: 20.00    Start date: 11/23/1960  . Smokeless tobacco: Never Used  . Tobacco comment: QUIT SMOKING 1962  Substance and Sexual Activity  . Alcohol use: Yes    Alcohol/week: 4.2 oz    Types: 7 Standard drinks or equivalent per week    Comment: Cocktail or glass of wine with dinner  . Drug use: No  . Sexual activity: Not on file  Other Topics Concern  . Not on file  Social History Narrative   Has living will   Daughter [name] is health care POA   Would accept resuscitation attempts but no prolonged ventilation   Not sure about tube feeds     Constitutional: Denies fever, malaise, fatigue, headache or abrupt weight changes.  HEENT: Denies eye pain, eye redness, ear pain, ringing in the ears, wax buildup, runny nose, nasal congestion, bloody nose, or sore throat. Respiratory: Denies difficulty breathing, shortness of breath, cough or sputum production.   Cardiovascular: Denies chest pain, chest tightness, palpitations or swelling in the hands or feet.  Gastrointestinal: Denies abdominal pain, bloating, constipation, diarrhea or blood in the stool.  GU: Denies urgency, frequency, pain with urination, burning sensation, blood in urine, odor or discharge. Musculoskeletal: Pt reports intermittent joint pain and swelling in hands. Denies decrease in range of motion, difficulty with gait, muscle pain.  Skin: Pt reports open area at left elbow.  Neurological: Denies dizziness, difficulty with memory, difficulty with speech or problems with balance and coordination.    No other specific complaints in a complete review of systems (except as listed in HPI above).  Objective:   Physical Exam  BP 138/64   Pulse 76   Temp 98.6 F (37 C)   Resp 18   Wt 174 lb (78.9 kg)   BMI 24.97 kg/m  Wt Readings from Last 3 Encounters:  10/06/17 174 lb (78.9 kg)  09/10/17 178 lb 3.2 oz (80.8 kg)  07/22/17 174 lb 9.6 oz (79.2 kg)    General: Appears his stated age, well developed,  well nourished in NAD. Skin: He has scarring to left elbow. He has a nodule with 3 open punctate holes. I pulled a piece of grass out of one of the openings. Open areas bleeding . Neck:  Neck supple. No masses present.  Cardiovascular: Normal rate and rhythm. S1,S2 noted.  No murmur, rubs or gallops noted. No JVD or BLE edema.  Pulmonary/Chest: Normal effort and positive vesicular breath sounds. No respiratory distress. No wheezes, rales or ronchi noted.  Abdomen: Soft and nontender. Normal bowel sounds.  Musculoskeletal: No signs of joint swelling. No difficulty with gait.  Neurological: Alert and oriented.  BMET    Component Value Date/Time   NA 141 07/12/2017 0339   K 3.9 07/12/2017 0339   CL 105 07/12/2017 0339   CO2 29 07/12/2017 0339   GLUCOSE 82 07/12/2017 0339   BUN 21 (H) 07/12/2017 0339   CREATININE 1.41 (H) 07/12/2017 0339   CREATININE 1.71 (H) 10/15/2015 1050   CALCIUM 9.0 07/12/2017 0339   GFRNONAA 44 (L) 07/12/2017 0339   GFRAA 50 (L) 07/12/2017 0339    Lipid Panel  No results found for: CHOL, TRIG, HDL, CHOLHDL, VLDL, LDLCALC  CBC    Component Value Date/Time   WBC 5.8 07/12/2017 0339   RBC 4.05 (L) 07/12/2017 0339   HGB 12.5 (L) 07/12/2017 0339   HCT 36.6 (L) 07/12/2017 0339   PLT 194 07/12/2017 0339   MCV 90.4 07/12/2017 0339   MCH 30.9 07/12/2017 0339   MCHC 34.2 07/12/2017 0339   RDW 13.6 07/12/2017 0339   LYMPHSABS 1.2 07/11/2017 1630   MONOABS 0.7 07/11/2017 1630   EOSABS 0.3 07/11/2017 1630   BASOSABS 0.1 07/11/2017 1630    Hgb A1C Lab Results  Component Value Date   HGBA1C 5.7 (H) 03/13/2013            Assessment & Plan:   Open Wound Left Elbow:  No s/s of infection Cover with TAB and bandaid Continue to treat with antifungal per derm Follow up with derm re: excision of squamous cell CA  Kaoru Benda, NP

## 2017-10-08 ENCOUNTER — Ambulatory Visit: Payer: Self-pay

## 2017-10-08 DIAGNOSIS — Z7901 Long term (current) use of anticoagulants: Secondary | ICD-10-CM

## 2017-10-08 DIAGNOSIS — I480 Paroxysmal atrial fibrillation: Secondary | ICD-10-CM

## 2017-10-08 LAB — POCT INR: INR: 3

## 2017-10-12 ENCOUNTER — Telehealth: Payer: Self-pay | Admitting: Internal Medicine

## 2017-10-12 NOTE — Telephone Encounter (Signed)
Discussed need for face to face documentation if his use, etc Will plan to do this at my next visit

## 2017-10-21 LAB — POCT INR: INR: 3.2

## 2017-10-22 ENCOUNTER — Ambulatory Visit: Payer: Self-pay

## 2017-10-22 DIAGNOSIS — I480 Paroxysmal atrial fibrillation: Secondary | ICD-10-CM

## 2017-10-22 DIAGNOSIS — Z7901 Long term (current) use of anticoagulants: Secondary | ICD-10-CM

## 2017-10-27 ENCOUNTER — Ambulatory Visit: Payer: Medicare Other | Admitting: Internal Medicine

## 2017-10-27 VITALS — BP 114/65 | HR 56 | Temp 95.3°F | Resp 19 | Wt 178.0 lb

## 2017-10-27 DIAGNOSIS — G4733 Obstructive sleep apnea (adult) (pediatric): Secondary | ICD-10-CM | POA: Diagnosis not present

## 2017-10-28 ENCOUNTER — Ambulatory Visit: Payer: Self-pay

## 2017-10-28 DIAGNOSIS — Z7901 Long term (current) use of anticoagulants: Secondary | ICD-10-CM

## 2017-10-28 DIAGNOSIS — I480 Paroxysmal atrial fibrillation: Secondary | ICD-10-CM

## 2017-10-28 LAB — POCT INR: INR: 2

## 2017-10-28 NOTE — Patient Instructions (Signed)
INR  On 12/06 is: 2.0.  Report from [facility].  Continue to take 1/2 pill (2.5mg ) daily EXCEPT for 1 pill 5mg  on Sundays, Tuesdays, and Thursdays. Recheck in 2 weeks due to starting allopurinol therapy for gout.  Notified Austin Miles, RN at [facility] of changes.  Notified Austin Miles, RN at [facility] who assists with managing his medications.

## 2017-10-29 ENCOUNTER — Encounter: Payer: Self-pay | Admitting: Internal Medicine

## 2017-10-29 NOTE — Progress Notes (Signed)
Subjective:    Patient ID: [name], male    DOB: [birth year], 83 y.o.   MRN: [id number]  HPI  Asked to see resident in [address]. Needs face to face encounter for new CPAP machine and supplies. He has had sleep apnea for a number of years. He reports his CPAP is old and falling apart. He is having to hold it together with tape. He is only averaging 5.5 hours of sleep per night. He feels tired when he wakes up and fatigued throughout the day. He reports when his CPAP machine was working well, he would get at least 8 hours of sleep per night.   Mode: Auto Minimum Water Pressure: 8 cm Maximum Water Pressure: 18 cm EPR: 3  Review of Systems      Past Medical History:  Diagnosis Date  . AAA (abdominal aortic aneurysm) without rupture ([place]) 03/03/2013   Small, partially thrombosed saccular aneurysm  . Arthritis    HANDS  . Atrial fibrillation ([place]) 02/10/2013  . Cancer ([place])    SKIN CANCERS  . Chronic diastolic congestive heart failure ([place]) 03/20/2013  . Chronic kidney disease, stage III (moderate) (HCC)   . Constipation   . Erectile dysfunction   . Hearing loss    BILATERAL - WEARS HEARING AIDS  . History of torn meniscus of left knee    PAINFUL LEFT KNEE  . Mitral valve prolapse   . Mitral valve regurgitation   . OSA on CPAP   . S/P Maze operation for atrial fibrillation 03/15/2013   Complete bilateral lesion set using cryothermy and bipolar radiofrequency ablation with oversewing of LA appendage via right mini thoracotomy  . Spinal stenosis, thoracic 03/03/2013   T5-6    Current Outpatient Medications  Medication Sig Dispense Refill  . cetirizine (ZYRTEC) 10 MG tablet Take 10 mg by mouth daily.     . cholecalciferol (VITAMIN D) 1000 UNITS tablet Take 1,000 Units by mouth every morning. Reported on 03/19/2016    . cyanocobalamin (,VITAMIN B-12,) 1000 MCG/ML injection Inject 1,000 mcg into the muscle every 14 (fourteen) days.     Marland Kitchen docusate sodium (COLACE) 100 MG capsule  Take 100 mg by mouth 2 (two) times daily.    . furosemide (LASIX) 20 MG tablet Take 1 tablet (20 mg total) by mouth every other day. 30 tablet 0  . Glucosamine-Chondroit-Vit C-Mn (GLUCOSAMINE CHONDR 500 COMPLEX) CAPS Take 2 capsules by mouth daily.     . polyethylene glycol (MIRALAX / GLYCOLAX) packet Take 17 g by mouth daily.    Marland Kitchen senna-docusate (SENOKOT-S) 8.6-50 MG tablet Take 2 tablets by mouth daily. (Patient taking differently: Take 2 tablets by mouth daily as needed. ) 60 tablet 11  . sodium chloride (OCEAN) 0.65 % SOLN nasal spray Place 1 spray into both nostrils as needed for congestion.    Marland Kitchen warfarin (COUMADIN) 5 MG tablet TAKE AS DIRECTED BY COUMADIN CLINIC 30 tablet 3   No current facility-administered medications for this visit.     Allergies  Allergen Reactions  . Statins     Muscle pains  . Latex Rash    bandaids--no other latex problems    Family History  Problem Relation Age of Onset  . Coronary artery disease Father   . Heart disease Father   . Heart attack Father   . Diabetes Mother   . Arthritis Mother   . Congestive Heart Failure Mother   . Alzheimer's disease Mother   . Alzheimer's disease Sister  Social History   Socioeconomic History  . Marital status: Widowed    Spouse name: Not on file  . Number of children: 2  . Years of education: Not on file  . Highest education level: Not on file  Social Needs  . Financial resource strain: Not on file  . Food insecurity - worry: Not on file  . Food insecurity - inability: Not on file  . Transportation needs - medical: Not on file  . Transportation needs - non-medical: Not on file  Occupational History  . Occupation: retired Art gallery manager  Tobacco Use  . Smoking status: Former Smoker    Packs/day: 1.00    Years: 20.00    Pack years: 20.00    Start date: 11/23/1960  . Smokeless tobacco: Never Used  . Tobacco comment: QUIT SMOKING 1962  Substance and Sexual Activity  . Alcohol use: Yes     Alcohol/week: 4.2 oz    Types: 7 Standard drinks or equivalent per week    Comment: Cocktail or glass of wine with dinner  . Drug use: No  . Sexual activity: Not on file  Other Topics Concern  . Not on file  Social History Narrative   Has living will   Daughter [name] is health care POA   Would accept resuscitation attempts but no prolonged ventilation   Not sure about tube feeds     Constitutional: Pt reports daytime fatiuge. Denies fever, malaise, headache or abrupt weight changes.  Respiratory: Denies difficulty breathing, shortness of breath, cough or sputum production.    No other specific complaints in a complete review of systems (except as listed in HPI above).  Objective:   Physical Exam   BP 114/65   Pulse (!) 56   Temp (!) 95.3 F (35.2 C)   Resp 19   Wt 178 lb (80.7 kg)   BMI 25.54 kg/m  Wt Readings from Last 3 Encounters:  10/29/17 178 lb (80.7 kg)  10/06/17 174 lb (78.9 kg)  09/10/17 178 lb 3.2 oz (80.8 kg)    General: Appears his stated age, in NAD. Pulmonary/Chest: Normal effort and positive vesicular breath sounds. No respiratory distress. No wheezes, rales or ronchi noted.   BMET    Component Value Date/Time   NA 141 07/12/2017 0339   K 3.9 07/12/2017 0339   CL 105 07/12/2017 0339   CO2 29 07/12/2017 0339   GLUCOSE 82 07/12/2017 0339   BUN 21 (H) 07/12/2017 0339   CREATININE 1.41 (H) 07/12/2017 0339   CREATININE 1.71 (H) 10/15/2015 1050   CALCIUM 9.0 07/12/2017 0339   GFRNONAA 44 (L) 07/12/2017 0339   GFRAA 50 (L) 07/12/2017 0339    Lipid Panel  No results found for: CHOL, TRIG, HDL, CHOLHDL, VLDL, LDLCALC  CBC    Component Value Date/Time   WBC 5.8 07/12/2017 0339   RBC 4.05 (L) 07/12/2017 0339   HGB 12.5 (L) 07/12/2017 0339   HCT 36.6 (L) 07/12/2017 0339   PLT 194 07/12/2017 0339   MCV 90.4 07/12/2017 0339   MCH 30.9 07/12/2017 0339   MCHC 34.2 07/12/2017 0339   RDW 13.6 07/12/2017 0339   LYMPHSABS 1.2 07/11/2017 1630    MONOABS 0.7 07/11/2017 1630   EOSABS 0.3 07/11/2017 1630   BASOSABS 0.1 07/11/2017 1630    Hgb A1C Lab Results  Component Value Date   HGBA1C 5.7 (H) 03/13/2013           Assessment & Plan:   OSA:  Order for new CPAP  machine and supplies signed RN has a copy of sleep study and latest machine readings Will have her attach a copy of this face to face encounter and send to home health agency  Will follow up as needed Webb Silversmith, NP

## 2017-11-11 ENCOUNTER — Ambulatory Visit: Payer: Self-pay

## 2017-11-11 DIAGNOSIS — Z7901 Long term (current) use of anticoagulants: Secondary | ICD-10-CM

## 2017-11-11 DIAGNOSIS — I480 Paroxysmal atrial fibrillation: Secondary | ICD-10-CM

## 2017-11-11 LAB — POCT INR: INR: 2.4

## 2017-11-11 NOTE — Patient Instructions (Signed)
Continue to take 1/2 pill (2.5mg ) daily EXCEPT for 1 pill 5mg  on Sundays, Tuesdays, and Thursdays. Recheck in 2 weeks due to starting allopurinol therapy for gout.  Notified Austin Miles, RN at [facility] of changes.

## 2017-11-30 ENCOUNTER — Other Ambulatory Visit: Payer: Self-pay | Admitting: Dermatology

## 2017-12-09 ENCOUNTER — Ambulatory Visit: Payer: Self-pay

## 2017-12-09 DIAGNOSIS — Z7901 Long term (current) use of anticoagulants: Secondary | ICD-10-CM

## 2017-12-09 DIAGNOSIS — I480 Paroxysmal atrial fibrillation: Secondary | ICD-10-CM

## 2017-12-09 LAB — POCT INR: INR: 3.6

## 2017-12-10 LAB — POCT INR: INR: 3.6

## 2017-12-10 NOTE — Patient Instructions (Signed)
INR  On 1/17 is 3.6.  Report from [facility].  Patient is to hold coumadin on 1/17 and then take decreased dosing of 1/2 pill (2.5mg ) daily EXCEPT for 1 pill 5mg  on Sundays and Thursdays. Recheck in 2 weeks.   Notified Austin Miles, RN at [facility] who assists with managing his medications.

## 2017-12-13 ENCOUNTER — Other Ambulatory Visit: Payer: Self-pay

## 2017-12-13 DIAGNOSIS — I714 Abdominal aortic aneurysm, without rupture, unspecified: Secondary | ICD-10-CM

## 2017-12-23 ENCOUNTER — Ambulatory Visit: Payer: Self-pay

## 2017-12-23 DIAGNOSIS — Z7901 Long term (current) use of anticoagulants: Secondary | ICD-10-CM

## 2017-12-23 DIAGNOSIS — I480 Paroxysmal atrial fibrillation: Secondary | ICD-10-CM

## 2017-12-23 LAB — POCT INR: INR: 1.9

## 2017-12-23 NOTE — Patient Instructions (Signed)
INR  On 1/31: 1.9.  Report from [facility].  Patient is to take 1.5 pills (7.5mg ) today 1/31 only and then resume prior dosing of 5mg  on Sun, Thurs and 2.5 mg the other days.  Recheck in 2 weeks.  Notified Austin Miles, RN at [facility] who assists with managing his medications.

## 2018-01-07 ENCOUNTER — Ambulatory Visit: Payer: Self-pay

## 2018-01-07 DIAGNOSIS — I480 Paroxysmal atrial fibrillation: Secondary | ICD-10-CM

## 2018-01-07 DIAGNOSIS — Z7901 Long term (current) use of anticoagulants: Secondary | ICD-10-CM

## 2018-01-07 LAB — POCT INR: INR: 3.4

## 2018-01-13 ENCOUNTER — Encounter: Payer: Self-pay | Admitting: Internal Medicine

## 2018-01-13 ENCOUNTER — Ambulatory Visit: Payer: Medicare Other | Admitting: Internal Medicine

## 2018-01-13 VITALS — BP 140/82 | HR 72 | Temp 98.1°F | Resp 18 | Wt 175.6 lb

## 2018-01-13 DIAGNOSIS — I714 Abdominal aortic aneurysm, without rupture, unspecified: Secondary | ICD-10-CM

## 2018-01-13 DIAGNOSIS — K5900 Constipation, unspecified: Secondary | ICD-10-CM

## 2018-01-13 DIAGNOSIS — M150 Primary generalized (osteo)arthritis: Secondary | ICD-10-CM

## 2018-01-13 DIAGNOSIS — N183 Chronic kidney disease, stage 3 unspecified: Secondary | ICD-10-CM

## 2018-01-13 DIAGNOSIS — I480 Paroxysmal atrial fibrillation: Secondary | ICD-10-CM

## 2018-01-13 DIAGNOSIS — I5032 Chronic diastolic (congestive) heart failure: Secondary | ICD-10-CM | POA: Diagnosis not present

## 2018-01-13 DIAGNOSIS — M159 Polyosteoarthritis, unspecified: Secondary | ICD-10-CM

## 2018-01-13 NOTE — Assessment & Plan Note (Signed)
Stable creatinine

## 2018-01-13 NOTE — Assessment & Plan Note (Signed)
Okay with just tylenol

## 2018-01-13 NOTE — Assessment & Plan Note (Signed)
Regular No symptoms On coumadin

## 2018-01-13 NOTE — Assessment & Plan Note (Signed)
Goes regularly but still gets sense of incomplete emptying Not ready for more medications

## 2018-01-13 NOTE — Assessment & Plan Note (Signed)
Has repeat ultrasound scheduled for next month in [place]

## 2018-01-13 NOTE — Progress Notes (Signed)
Subjective:    Patient ID: [name], male    DOB: [birth year], 84 y.o.   MRN: [id number]  HPI Visit in [place] at [facility] for review of chronic health conditions Reviewed status with Luellen Pucker RN Recent case of flu in facility---starting on preventative tamiflu Some trouble with word finding No change in over functioning  Did get new CPAP  Sleeping well with this and uses it nightly for full sleep  Still concerned about his slow bowels Goes regularly but not as well as he would like Feels like he doesn't empty Appetite is fine No abdominal pain  No heart symptoms No palpitations No claudication (only some knee pain) No dizziness or syncope  sig edema  Has follow up ultrasound scheduled next month for AAA  Current Outpatient Medications on File Prior to Visit  Medication Sig Dispense Refill  . acetaminophen (TYLENOL) 650 MG CR tablet Take 650 mg by mouth 3 (three) times daily.    Marland Kitchen allopurinol (ZYLOPRIM) 100 MG tablet Take 100 mg by mouth daily.    . cetirizine (ZYRTEC) 10 MG tablet Take 10 mg by mouth daily.     . cholecalciferol (VITAMIN D) 1000 UNITS tablet Take 1,000 Units by mouth every morning. Reported on 03/19/2016    . cyanocobalamin (,VITAMIN B-12,) 1000 MCG/ML injection Inject 1,000 mcg into the muscle every 14 (fourteen) days.     Marland Kitchen docusate sodium (COLACE) 100 MG capsule Take 100 mg by mouth 2 (two) times daily.    . furosemide (LASIX) 20 MG tablet Take 1 tablet (20 mg total) by mouth every other day. 30 tablet 0  . Glucosamine-Chondroit-Vit C-Mn (GLUCOSAMINE CHONDR 500 COMPLEX) CAPS Take 2 capsules by mouth daily.     . polyethylene glycol (MIRALAX / GLYCOLAX) packet Take 17 g by mouth daily.    Marland Kitchen senna-docusate (SENOKOT-S) 8.6-50 MG tablet Take 2 tablets by mouth daily. (Patient taking differently: Take 2 tablets by mouth daily as needed. ) 60 tablet 11  . sodium chloride (OCEAN) 0.65 % SOLN nasal spray Place 1 spray into both nostrils as needed for congestion.     Marland Kitchen warfarin (COUMADIN) 5 MG tablet TAKE AS DIRECTED BY COUMADIN CLINIC 30 tablet 3   No current facility-administered medications on file prior to visit.     Allergies  Allergen Reactions  . Statins     Muscle pains  . Latex Rash    bandaids--no other latex problems    Past Medical History:  Diagnosis Date  . AAA (abdominal aortic aneurysm) without rupture ([place]) 03/03/2013   Small, partially thrombosed saccular aneurysm  . Arthritis    HANDS  . Atrial fibrillation ([place]) 02/10/2013  . Cancer ([place])    SKIN CANCERS  . Chronic diastolic congestive heart failure ([place]) 03/20/2013  . Chronic kidney disease, stage III (moderate) (HCC)   . Constipation   . Erectile dysfunction   . Hearing loss    BILATERAL - WEARS HEARING AIDS  . History of torn meniscus of left knee    PAINFUL LEFT KNEE  . Mitral valve prolapse   . Mitral valve regurgitation   . OSA on CPAP   . S/P Maze operation for atrial fibrillation 03/15/2013   Complete bilateral lesion set using cryothermy and bipolar radiofrequency ablation with oversewing of LA appendage via right mini thoracotomy  . Spinal stenosis, thoracic 03/03/2013   T5-6    Past Surgical History:  Procedure Laterality Date  . CARPAL TUNNEL RELEASE     BILATERAL  .  [place]  . INTRAOPERATIVE TRANSESOPHAGEAL ECHOCARDIOGRAM Right 03/15/2013   Procedure: INTRAOPERATIVE TRANSESOPHAGEAL ECHOCARDIOGRAM;  Surgeon: Rexene Alberts, MD;  Location: [place] [facility];  Service: Open Heart Surgery;  Laterality: Right;  . MINIMALLY INVASIVE MAZE PROCEDURE Right 03/15/2013   Procedure: MINIMALLY INVASIVE MAZE PROCEDURE;  Surgeon: Rexene Alberts, MD;  Location: [place];  Service: Open Heart Surgery;  Laterality: Right;  (R) AXILLARY CANNULATION  . MITRAL VALVE REPAIR Right 03/15/2013   Procedure: MINIMALLY INVASIVE MITRAL VALVE REPAIR (MVR);  Surgeon: Rexene Alberts, MD;  Location: [place];  Service: Open Heart Surgery;  Laterality: Right;  . RIGHT ROTATOR  CUFF REPAIR    . SKIN CANCERS REMOVED FROM BOTH LEGS    . SKIN CANCERS REMOVED FROM LEFT FOREHEAD AND LEFT NOSE    . SURGERY LEFT NECK FOR REMOVAL OF METASTATIC LYMPH NODE ( FROM CANCER LEFT FOREHEAD)  AND LEFT NECK DISSECTION-REST OF LYMPH NODES NEGATIVE FOR ANY CANCER    . TEE WITHOUT CARDIOVERSION N/A 01/31/2013   Procedure: TRANSESOPHAGEAL ECHOCARDIOGRAM (TEE);  Surgeon: Sueanne Margarita, MD;  Location: [facility] ENDOSCOPY;  Service: Cardiovascular;  Laterality: N/A;    Family History  Problem Relation Age of Onset  . Coronary artery disease Father   . Heart disease Father   . Heart attack Father   . Diabetes Mother   . Arthritis Mother   . Congestive Heart Failure Mother   . Alzheimer's disease Mother   . Alzheimer's disease Sister     Social History   Socioeconomic History  . Marital status: Widowed    Spouse name: Not on file  . Number of children: 2  . Years of education: Not on file  . Highest education level: Not on file  Social Needs  . Financial resource strain: Not on file  . Food insecurity - worry: Not on file  . Food insecurity - inability: Not on file  . Transportation needs - medical: Not on file  . Transportation needs - non-medical: Not on file  Occupational History  . Occupation: retired Art gallery manager  Tobacco Use  . Smoking status: Former Smoker    Packs/day: 1.00    Years: 20.00    Pack years: 20.00    Start date: 11/23/1960  . Smokeless tobacco: Never Used  . Tobacco comment: QUIT SMOKING 1962  Substance and Sexual Activity  . Alcohol use: Yes    Alcohol/week: 4.2 oz    Types: 7 Standard drinks or equivalent per week    Comment: Cocktail or glass of wine with dinner  . Drug use: No  . Sexual activity: Not on file  Other Topics Concern  . Not on file  Social History Narrative   Has living will   Daughter [name] is health care POA   Would accept resuscitation attempts but no prolonged ventilation   Not sure about tube feeds   Review of  Systems No recent gout problems Now on allopurinol Weight stable Voids okay in general No sig back pain. Chronic left knee pain---not too bad Sees dermatologist regularly--for check ups and lesion removal (left leg below knee)    Objective:   Physical Exam  Constitutional: He appears well-developed. No distress.  Neck: No thyromegaly present.  Cardiovascular: Normal rate, regular rhythm and normal heart sounds. Exam reveals no gallop.  No murmur heard. Pulmonary/Chest: Effort normal and breath sounds normal. He has no wheezes. He has no rales. He exhibits no tenderness.  Musculoskeletal:  1+ pedal edema  Lymphadenopathy:    He has no cervical adenopathy.  Psychiatric: He has a normal mood and affect. His behavior is normal.          Assessment & Plan:

## 2018-01-13 NOTE — Assessment & Plan Note (Signed)
Compensated No problems on the furosemide

## 2018-01-20 ENCOUNTER — Ambulatory Visit: Payer: Self-pay

## 2018-01-20 DIAGNOSIS — Z7901 Long term (current) use of anticoagulants: Secondary | ICD-10-CM

## 2018-01-20 DIAGNOSIS — I480 Paroxysmal atrial fibrillation: Secondary | ICD-10-CM

## 2018-01-20 LAB — POCT INR: INR: 2.9

## 2018-02-11 ENCOUNTER — Ambulatory Visit: Payer: Self-pay

## 2018-02-11 DIAGNOSIS — Z7901 Long term (current) use of anticoagulants: Secondary | ICD-10-CM

## 2018-02-11 DIAGNOSIS — I480 Paroxysmal atrial fibrillation: Secondary | ICD-10-CM

## 2018-02-11 LAB — POCT INR: INR: 2.8

## 2018-02-13 ENCOUNTER — Encounter: Payer: Self-pay | Admitting: Internal Medicine

## 2018-02-18 ENCOUNTER — Ambulatory Visit (INDEPENDENT_AMBULATORY_CARE_PROVIDER_SITE_OTHER): Payer: Medicare Other | Admitting: Family

## 2018-02-18 ENCOUNTER — Other Ambulatory Visit: Payer: Self-pay

## 2018-02-18 ENCOUNTER — Ambulatory Visit (HOSPITAL_COMMUNITY)
Admission: RE | Admit: 2018-02-18 | Discharge: 2018-02-18 | Disposition: A | Discharge status: Home / Self Care | Payer: Medicare Other | Source: Ambulatory Visit | Attending: Family | Admitting: Family

## 2018-02-18 ENCOUNTER — Encounter: Payer: Self-pay | Admitting: Family

## 2018-02-18 VITALS — BP 128/69 | HR 61 | Temp 96.9°F | Resp 18 | Ht 70.0 in | Wt 167.0 lb

## 2018-02-18 DIAGNOSIS — I714 Abdominal aortic aneurysm, without rupture, unspecified: Secondary | ICD-10-CM

## 2018-02-18 DIAGNOSIS — Z87891 Personal history of nicotine dependence: Secondary | ICD-10-CM | POA: Insufficient documentation

## 2018-02-18 DIAGNOSIS — I872 Venous insufficiency (chronic) (peripheral): Secondary | ICD-10-CM | POA: Diagnosis not present

## 2018-02-18 NOTE — Progress Notes (Signed)
[facility] [place]   CC: Follow up Abdominal Aortic Aneurysm  History of Present Illness  [name] is a 84 y.o. (17-Apr-1929) male whom Dr. Donnetta Hutching has been monitoring for abdominal aortic aneurysm. Also for imaging of his popliteal arteries. No symptoms referable to his aneurysm.  Dr. Donnetta Hutching last evaluated pt on 12-15-16. At that time no change with maximal diameter of his infrarenal aorta of 3.4 cm, prior study showed maximal diameter of 3.5 cm. Popliteal arteries 1 cm bilaterally. Dr. Donnetta Hutching recommend two-year follow-up with his small aneurysm. Explained that he currently has essentially 0 risk for rupture related to this small aneurysm and that would predict small growth over time at his current size.  He states that the swelling in his lower legs and feet resolve by morning with elevation of his legs.   He denies claudication sx's in his legs with walking, states pain in left knee at times.  He denies any known hx of stroke or TIA.   He has has several skin cancers removed from his face and ears.   He takes coumadin, has atrial fib and hx of mitral valve repair.   Diabetic: No Tobacco use: former smoker, quit at age 56, started in 41  Past Medical History:  Diagnosis Date  . AAA (abdominal aortic aneurysm) without rupture ([place]) 03/03/2013   Small, partially thrombosed saccular aneurysm  . Arthritis    HANDS  . Atrial fibrillation ([place]) 02/10/2013  . Cancer ([place])    SKIN CANCERS  . Chronic diastolic congestive heart failure ([place]) 03/20/2013  . Chronic kidney disease, stage III (moderate) (HCC)   . Constipation   . Erectile dysfunction   . Hearing loss    BILATERAL - WEARS HEARING AIDS  . History of torn meniscus of left knee    PAINFUL LEFT KNEE  . Mitral valve prolapse   . Mitral valve regurgitation   . OSA on CPAP   . S/P Maze operation for atrial fibrillation 03/15/2013   Complete bilateral lesion set using cryothermy and bipolar  radiofrequency ablation with oversewing of LA appendage via right mini thoracotomy  . Spinal stenosis, thoracic 03/03/2013   T5-6   Past Surgical History:  Procedure Laterality Date  . CARPAL TUNNEL RELEASE     BILATERAL  . [place]  . INTRAOPERATIVE TRANSESOPHAGEAL ECHOCARDIOGRAM Right 03/15/2013   Procedure: INTRAOPERATIVE TRANSESOPHAGEAL ECHOCARDIOGRAM;  Surgeon: Rexene Alberts, MD;  Location: [place];  Service: Open Heart Surgery;  Laterality: Right;  . MINIMALLY INVASIVE MAZE PROCEDURE Right 03/15/2013   Procedure: MINIMALLY INVASIVE MAZE PROCEDURE;  Surgeon: Rexene Alberts, MD;  Location: [place];  Service: Open Heart Surgery;  Laterality: Right;  (R) AXILLARY CANNULATION  . MITRAL VALVE REPAIR Right 03/15/2013   Procedure: MINIMALLY INVASIVE MITRAL VALVE REPAIR (MVR);  Surgeon: Rexene Alberts, MD;  Location: [place];  Service: Open Heart Surgery;  Laterality: Right;  . RIGHT ROTATOR CUFF REPAIR    . SKIN CANCERS REMOVED FROM BOTH LEGS    . SKIN CANCERS REMOVED FROM LEFT FOREHEAD AND LEFT NOSE    . SURGERY LEFT NECK FOR REMOVAL OF METASTATIC LYMPH NODE ( FROM CANCER LEFT FOREHEAD)  AND LEFT NECK DISSECTION-REST OF LYMPH NODES NEGATIVE FOR ANY CANCER    . TEE WITHOUT CARDIOVERSION N/A 01/31/2013   Procedure: TRANSESOPHAGEAL ECHOCARDIOGRAM (TEE);  Surgeon: Sueanne Margarita, MD;  Location[facility] ENDOSCOPY;  Service: Cardiovascular;  Laterality: N/A;   Social History Social History  Socioeconomic History  . Marital status: Widowed    Spouse name: Not on file  . Number of children: 2  . Years of education: Not on file  . Highest education level: Not on file  Occupational History  . Occupation: retired Art gallery manager  Social Needs  . Financial resource strain: Not on file  . Food insecurity:    Worry: Not on file    Inability: Not on file  . Transportation needs:    Medical: Not on file    Non-medical: Not on file  Tobacco Use  . Smoking status: Former Smoker     Packs/day: 1.00    Years: 20.00    Pack years: 20.00    Start date: 11/23/1960  . Smokeless tobacco: Never Used  . Tobacco comment: QUIT SMOKING 1962  Substance and Sexual Activity  . Alcohol use: Yes    Alcohol/week: 4.2 oz    Types: 7 Standard drinks or equivalent per week    Comment: Cocktail or glass of wine with dinner  . Drug use: No  . Sexual activity: Not on file  Lifestyle  . Physical activity:    Days per week: Not on file    Minutes per session: Not on file  . Stress: Not on file  Relationships  . Social connections:    Talks on phone: Not on file    Gets together: Not on file    Attends religious service: Not on file    Active member of club or organization: Not on file    Attends meetings of clubs or organizations: Not on file    Relationship status: Not on file  . Intimate partner violence:    Fear of current or ex partner: Not on file    Emotionally abused: Not on file    Physically abused: Not on file    Forced sexual activity: Not on file  Other Topics Concern  . Not on file  Social History Narrative   Has living will   Daughter [name] is health care POA   Would accept resuscitation attempts but no prolonged ventilation   Not sure about tube feeds   Family History Family History  Problem Relation Age of Onset  . Coronary artery disease Father   . Heart disease Father   . Heart attack Father   . Diabetes Mother   . Arthritis Mother   . Congestive Heart Failure Mother   . Alzheimer's disease Mother   . Alzheimer's disease Sister     Current Outpatient Medications on File Prior to Visit  Medication Sig Dispense Refill  . acetaminophen (TYLENOL) 650 MG CR tablet Take 650 mg by mouth 3 (three) times daily.    Marland Kitchen allopurinol (ZYLOPRIM) 100 MG tablet Take 100 mg by mouth daily.    . cetirizine (ZYRTEC) 10 MG tablet Take 10 mg by mouth daily.     . cholecalciferol (VITAMIN D) 1000 UNITS tablet Take 1,000 Units by mouth every morning. Reported on 03/19/2016     . cyanocobalamin (,VITAMIN B-12,) 1000 MCG/ML injection Inject 1,000 mcg into the muscle every 14 (fourteen) days.     Marland Kitchen docusate sodium (COLACE) 100 MG capsule Take 100 mg by mouth 2 (two) times daily.    . furosemide (LASIX) 20 MG tablet Take 1 tablet (20 mg total) by mouth every other day. 30 tablet 0  . polyethylene glycol (MIRALAX / GLYCOLAX) packet Take 17 g by mouth daily.    Marland Kitchen senna-docusate (SENOKOT-S) 8.6-50 MG tablet Take 2  tablets by mouth daily. (Patient taking differently: Take 2 tablets by mouth daily as needed. ) 60 tablet 11  . sodium chloride (OCEAN) 0.65 % SOLN nasal spray Place 1 spray into both nostrils as needed for congestion.    Marland Kitchen warfarin (COUMADIN) 5 MG tablet TAKE AS DIRECTED BY COUMADIN CLINIC 30 tablet 3   No current facility-administered medications on file prior to visit.    Allergies  Allergen Reactions  . Statins     Muscle pains  . Latex Rash    bandaids--no other latex problems    ROS: See HPI for pertinent positives and negatives.  Physical Examination  Vitals:   02/18/18 0934  BP: 128/69  Pulse: 61  Resp: 18  Temp: (!) 96.9 F (36.1 C)  TempSrc: Oral  SpO2: 100%  Weight: 167 lb (75.8 kg)  Height: 5\' 10"  (1.778 m)   Body mass index is 23.96 kg/m.  General: A&O x 3, WD, elderly male. HEENT: Grossly intact and WNL.  Pulmonary: Sym exp, respirations are non labored, good air movt, CTAB, no rales, rhonchi, or wheezing. Cardiac: Regular rhythm and rate, + murmur.   Carotid Bruits Right Left   Negative Negative   Adominal aortic pulse is not palpable Radial pulses are 2+ palpable                          VASCULAR EXAM:                                                                                                         LE Pulses Right Left       FEMORAL   palpable   palpable        POPLITEAL  2+palpable   1+ palpable       POSTERIOR TIBIAL   palpable    palpable        DORSALIS PEDIS      ANTERIOR TIBIAL  palpable    palpable     Gastrointestinal: soft, NTND, -G/R, - HSM, - masses palpated, - CVAT B. Musculoskeletal: M/S 5/5 throughout, Extremities without ischemic changes. Skin: No rashes, no ulcers, no cellulitis. + venous stasis changes. 1-2+ pitting and non pitting edema in both lower legs and feet.  Neurologic: CN 2-12 intact except is hard of hearing, Pain and light touch intact in extremities are intact, Motor exam as listed above. Psychiatric: Normal thought content, mood appropriate to clinical situation.    DATA  AAA Duplex (02/18/2018):  Previous size:  3.5 cm (Date: 12-15-16)       Current size: 3.6 cm (Date: 02/18/18): Right CIA: 1.2 cm; Left CIA: 1.2 cm  Mild ectasia of right popliteal artery at 1.03 cm on 12-15-16. Normal left at 0.87 cm.   Medical Decision Making  The patient is a 84 y.o. male who presents with asymptomatic AAA with 1 mm increase in size to 3.6 cm today was 3.5 cm on 12-15-16.  Venous insufficiency with 1-2+ pitting and nonpitting edema in lower legs and feet:  Knee high compression  hose To decrease swelling in your feet and legs: Elevate feet above slightly bent knees, feet above heart, overnight and 3-4 times per day for 20 minutes.   Based on this patient's exam and diagnostic studies, the patient will follow up in 18 months  with the following studies: AAA duplex.  Consideration for repair of AAA would be made when the size is 5.0 cm, growth > 1 cm/yr, and symptomatic status.        The patient was given information about AAA including signs, symptoms, treatment, and how to minimize the risk of enlargement and rupture of aneurysms.    I emphasized the importance of maximal medical management including strict control of blood pressure, blood glucose, and lipid levels, antiplatelet agents, obtaining regular exercise, and continued  cessation of smoking.   The patient was advised to call 911 should the patient experience sudden onset abdominal or back pain.    Thank you for allowing [place] to participate in this patient's care.  Clemon Chambers, RN, MSN, FNP-C [facility] [place] Office: [phone]  Clinic Physician: Donzetta Matters  02/18/2018, 9:58 AM

## 2018-02-18 NOTE — Patient Instructions (Addendum)
Abdominal Aortic Aneurysm Blood pumps away from the heart through tubes (blood vessels) called arteries. Aneurysms are weak or damaged places in the wall of an artery. It bulges out like a balloon. An abdominal aortic aneurysm happens in the main artery of the body (aorta). It can burst or tear, causing bleeding inside the body. This is an emergency. It needs treatment right away. What are the causes? The exact cause is unknown. Things that could cause this problem include:  Fat and other substances building up in the lining of a tube.  Swelling of the walls of a blood vessel.  Certain tissue diseases.  Belly (abdominal) trauma.  An infection in the main artery of the body.  What increases the risk? There are things that make it more likely for you to have an aneurysm. These include:  Being over the age of 82 years old.  Having high blood pressure (hypertension).  Being a male.  Being white.  Being very overweight (obese).  Having a family history of aneurysm.  Using tobacco products.  What are the signs or symptoms? Symptoms depend on the size of the aneurysm and how fast it grows. There may not be symptoms. If symptoms occur, they can include:  Pain (belly, side, lower back, or groin).  Feeling full after eating a small amount of food.  Feeling sick to your stomach (nauseous), throwing up (vomiting), or both.  Feeling a lump in your belly that feels like it is beating (pulsating).  Feeling like you will pass out (faint).  How is this treated?  Medicine to control blood pressure and pain.  Imaging tests to see if the aneurysm gets bigger.  Surgery. How is this prevented? To lessen your chance of getting this condition:  Stop smoking. Stop chewing tobacco.  Limit or avoid alcohol.  Keep your blood pressure, blood sugar, and cholesterol within normal limits.  Eat less salt.  Eat foods low in saturated fats and cholesterol. These are found in animal and  whole dairy products.  Eat more fiber. Fiber is found in whole grains, vegetables, and fruits.  Keep a healthy weight.  Stay active and exercise often.  This information is not intended to replace advice given to you by your health care provider. Make sure you discuss any questions you have with your health care provider. Document Released: 03/06/2013 Document Revised: 04/16/2016 Document Reviewed: 12/09/2012 Elsevier Interactive Patient Education  2017 Elsevier Inc.      Chronic Venous Insufficiency Chronic venous insufficiency, also called venous stasis, is a condition that prevents blood from being pumped effectively through the veins in your legs. Blood may no longer be pumped effectively from the legs back to the heart. This condition can range from mild to severe. With proper treatment, you should be able to continue with an active life. What are the causes? Chronic venous insufficiency occurs when the vein walls become stretched, weakened, or damaged, or when valves within the vein are damaged. Some common causes of this include:  High blood pressure inside the veins (venous hypertension).  Increased blood pressure in the leg veins from long periods of sitting or standing.  A blood clot that blocks blood flow in a vein (deep vein thrombosis, DVT).  Inflammation of a vein (phlebitis) that causes a blood clot to form.  Tumors in the pelvis that cause blood to back up.  What increases the risk? The following factors may make you more likely to develop this condition:  Having a family history of  this condition.  Obesity.  Pregnancy.  Living without enough physical activity or exercise (sedentary lifestyle).  Smoking.  Having a job that requires long periods of standing or sitting in one place.  Being a certain age. Women in their 43s and 47s and men in their 57s are more likely to develop this condition.  What are the signs or symptoms? Symptoms of this condition  include:  Veins that are enlarged, bulging, or twisted (varicose veins).  Skin breakdown or ulcers.  Reddened or discolored skin on the front of the leg.  Brown, smooth, tight, and painful skin just above the ankle, usually on the inside of the leg (lipodermatosclerosis).  Swelling.  How is this diagnosed? This condition may be diagnosed based on:  Your medical history.  A physical exam.  Tests, such as: ? A procedure that creates an image of a blood vessel and nearby organs and provides information about blood flow through the blood vessel (duplex ultrasound). ? A procedure that tests blood flow (plethysmography). ? A procedure to look at the veins using X-ray and dye (venogram).  How is this treated? The goals of treatment are to help you return to an active life and to minimize pain or disability. Treatment depends on the severity of your condition, and it may include:  Wearing compression stockings. These can help relieve symptoms and help prevent your condition from getting worse. However, they do not cure the condition.  Sclerotherapy. This is a procedure involving an injection of a material that "dissolves" damaged veins.  Surgery. This may involve: ? Removing a diseased vein (vein stripping). ? Cutting off blood flow through the vein (laser ablation surgery). ? Repairing a valve.  Follow these instructions at home:  Wear compression stockings as told by your health care provider. These stockings help to prevent blood clots and reduce swelling in your legs.  Take over-the-counter and prescription medicines only as told by your health care provider.  Stay active by exercising, walking, or doing different activities. Ask your health care provider what activities are safe for you and how much exercise you need.  Drink enough fluid to keep your urine clear or pale yellow.  Do not use any products that contain nicotine or tobacco, such as cigarettes and e-cigarettes.  If you need help quitting, ask your health care provider.  Keep all follow-up visits as told by your health care provider. This is important. Contact a health care provider if:  You have redness, swelling, or more pain in the affected area.  You see a red streak or line that extends up or down from the affected area.  You have skin breakdown or a loss of skin in the affected area, even if the breakdown is small.  You get an injury in the affected area. Get help right away if:  You get an injury and an open wound in the affected area.  You have severe pain that does not get better with medicine.  You have sudden numbness or weakness in the foot or ankle below the affected area, or you have trouble moving your foot or ankle.  You have a fever and you have worse or persistent symptoms.  You have chest pain.  You have shortness of breath. Summary  Chronic venous insufficiency, also called venous stasis, is a condition that prevents blood from being pumped effectively through the veins in your legs.  Chronic venous insufficiency occurs when the vein walls become stretched, weakened, or damaged, or when valves within  the vein are damaged.  Treatment for this condition depends on how severe your condition is, and it may involve wearing compression stockings or having a procedure.  Make sure you stay active by exercising, walking, or doing different activities. Ask your health care provider what activities are safe for you and how much exercise you need. This information is not intended to replace advice given to you by your health care provider. Make sure you discuss any questions you have with your health care provider. Document Released: 03/15/2007 Document Revised: 09/28/2016 Document Reviewed: 09/28/2016 Elsevier Interactive Patient Education  2017 [employer].    To decrease swelling in your feet and legs: Elevate feet above slightly bent knees, feet above heart, overnight  and 3-4 times per day for 20 minutes.   To measure for knee high compression hose: Measure the length of calf (from the crease of the knee to the bottom of the heel), largest circumference of calf, and ankle circumference first thing in the morning before your legs have a chance to swell.  Take these 3 measurements with you to obtain 20-30 mm mercury graduated knee high compression hose.  Put the stockings on in the morning, remove at bedtime.

## 2018-02-21 NOTE — Patient Instructions (Signed)
INR  On 3/22: 2.8.  Report from [facility].  Patient is to continue taking 2.5mg  daily EXCEPT for 5mg  on Sundays only and recheck in 3-4 weeks.    Notified Austin Miles, RN at [facility] who assists with managing his medications.

## 2018-03-10 LAB — POCT INR: INR: 2.9

## 2018-03-15 ENCOUNTER — Ambulatory Visit: Payer: Self-pay

## 2018-03-15 DIAGNOSIS — I480 Paroxysmal atrial fibrillation: Secondary | ICD-10-CM

## 2018-03-15 DIAGNOSIS — Z7901 Long term (current) use of anticoagulants: Secondary | ICD-10-CM

## 2018-03-15 LAB — POCT INR: INR: 2.9

## 2018-03-15 NOTE — Patient Instructions (Signed)
INR  On 4/18: 2.9.  Report from [facility].  Patient is to continue taking 2.5mg  daily EXCEPT for 5mg  on Sundays only and recheck in 3-4 weeks.    Notified Austin Miles, RN at [facility] who assists with managing his medications.

## 2018-03-23 ENCOUNTER — Ambulatory Visit: Payer: Medicare Other | Admitting: Internal Medicine

## 2018-03-23 DIAGNOSIS — Z9989 Dependence on other enabling machines and devices: Secondary | ICD-10-CM | POA: Diagnosis not present

## 2018-03-23 DIAGNOSIS — G4733 Obstructive sleep apnea (adult) (pediatric): Secondary | ICD-10-CM | POA: Diagnosis not present

## 2018-03-28 ENCOUNTER — Encounter: Payer: Self-pay | Admitting: Internal Medicine

## 2018-03-28 NOTE — Assessment & Plan Note (Signed)
Sleep has improved with use of new CPAP Continue current settings Will monitor

## 2018-03-28 NOTE — Progress Notes (Signed)
Subjective:    Patient ID: [name], male    DOB: Aug 03, 1929, 84 y.o.   MRN: [id number]  HPI  Asked to see resident in [address] Follow up recent order for new CPAP machine He reports he is sleeping better with new CPAP  He is averaging 7 hours of sleep per night.  He feels rested when he wakes up He does not nap during the day Last sleep study 02/2011  Review of Systems      Past Medical History:  Diagnosis Date  . AAA (abdominal aortic aneurysm) without rupture ([place]) 03/03/2013   Small, partially thrombosed saccular aneurysm  . Arthritis    HANDS  . Atrial fibrillation ([place]) 02/10/2013  . Cancer ([place])    SKIN CANCERS  . Chronic diastolic congestive heart failure ([place]) 03/20/2013  . Chronic kidney disease, stage III (moderate) ([facility])   . Constipation   . Erectile dysfunction   . Hearing loss    BILATERAL - WEARS HEARING AIDS  . History of torn meniscus of left knee    PAINFUL LEFT KNEE  . Mitral valve prolapse   . Mitral valve regurgitation   . OSA on CPAP   . S/P [facility] operation for atrial fibrillation 03/15/2013   Complete bilateral lesion set using cryothermy and bipolar radiofrequency ablation with oversewing of LA appendage via right mini thoracotomy  . Spinal stenosis, thoracic 03/03/2013   T5-6    Current Outpatient Medications  Medication Sig Dispense Refill  . acetaminophen (TYLENOL) 650 MG CR tablet Take 650 mg by mouth 3 (three) times daily.    Marland Kitchen allopurinol (ZYLOPRIM) 100 MG tablet Take 100 mg by mouth daily.    . cetirizine (ZYRTEC) 10 MG tablet Take 10 mg by mouth daily.     . cholecalciferol (VITAMIN D) 1000 UNITS tablet Take 1,000 Units by mouth every morning. Reported on 03/19/2016    . cyanocobalamin (,VITAMIN B-12,) 1000 MCG/ML injection Inject 1,000 mcg into the muscle every 14 (fourteen) days.     Marland Kitchen docusate sodium (COLACE) 100 MG capsule Take 100 mg by mouth 2 (two) times daily.    . furosemide (LASIX) 20 MG tablet Take 1 tablet (20 mg total)  by mouth every other day. 30 tablet 0  . polyethylene glycol (MIRALAX / GLYCOLAX) packet Take 17 g by mouth daily.    Marland Kitchen senna-docusate (SENOKOT-S) 8.6-50 MG tablet Take 2 tablets by mouth daily. (Patient taking differently: Take 2 tablets by mouth daily as needed. ) 60 tablet 11  . sodium chloride (OCEAN) 0.65 % SOLN nasal spray Place 1 spray into both nostrils as needed for congestion.    Marland Kitchen warfarin (COUMADIN) 5 MG tablet TAKE AS DIRECTED BY COUMADIN CLINIC 30 tablet 3   No current facility-administered medications for this visit.     Allergies  Allergen Reactions  . Statins     Muscle pains  . Latex Rash    bandaids--no other latex problems    Family History  Problem Relation Age of Onset  . Coronary artery disease Father   . Heart disease Father   . Heart attack Father   . Diabetes Mother   . Arthritis Mother   . Congestive Heart Failure Mother   . Alzheimer's disease Mother   . Alzheimer's disease Sister     Social History   Socioeconomic History  . Marital status: Widowed    Spouse name: Not on file  . Number of children: 2  . Years of education: Not on  file  . Highest education level: Not on file  Occupational History  . Occupation: retired Art gallery manager  Social Needs  . Financial resource strain: Not on file  . Food insecurity:    Worry: Not on file    Inability: Not on file  . Transportation needs:    Medical: Not on file    Non-medical: Not on file  Tobacco Use  . Smoking status: Former Smoker    Packs/day: 1.00    Years: 20.00    Pack years: 20.00    Start date: 11/23/1960  . Smokeless tobacco: Never Used  . Tobacco comment: QUIT SMOKING 1962  Substance and Sexual Activity  . Alcohol use: Yes    Alcohol/week: 4.2 oz    Types: 7 Standard drinks or equivalent per week    Comment: Cocktail or glass of wine with dinner  . Drug use: No  . Sexual activity: Not on file  Lifestyle  . Physical activity:    Days per week: Not on file    Minutes per  session: Not on file  . Stress: Not on file  Relationships  . Social connections:    Talks on phone: Not on file    Gets together: Not on file    Attends religious service: Not on file    Active member of club or organization: Not on file    Attends meetings of clubs or organizations: Not on file    Relationship status: Not on file  . Intimate partner violence:    Fear of current or ex partner: Not on file    Emotionally abused: Not on file    Physically abused: Not on file    Forced sexual activity: Not on file  Other Topics Concern  . Not on file  Social History Narrative   Has living will   Daughter [name] is health care POA   Would accept resuscitation attempts but no prolonged ventilation   Not sure about tube feeds     Constitutional: Denies fever, malaise, fatigue, headache or abrupt weight changes.  Respiratory: Denies difficulty breathing, shortness of breath, cough or sputum production.   Cardiovascular: Denies chest pain, chest tightness, palpitations or swelling in the hands or feet.  Neurological: Denies dizziness, difficulty with memory, difficulty with speech or problems with balance and coordination.    No other specific complaints in a complete review of systems (except as listed in HPI above).  Objective:   Physical Exam   BP 128/65   Pulse 60   Temp (!) 97.2 F (36.2 C)   Resp 18   Wt 136 lb (61.7 kg)   BMI 19.51 kg/m  Wt Readings from Last 3 Encounters:  03/28/18 136 lb (61.7 kg)  02/18/18 167 lb (75.8 kg)  01/13/18 175 lb 9.6 oz (79.7 kg)    General: Appears his stated age, well developed, well nourished in NAD. Cardiovascular: Normal rate and rhythm. S1,S2 noted.  No murmur, rubs or gallops noted.  Pulmonary/Chest: Normal effort and positive vesicular breath sounds. No respiratory distress. No wheezes, rales or ronchi noted.  Neurological: Alert and oriented.    BMET    Component Value Date/Time   NA 141 07/12/2017 0339   K 3.9  07/12/2017 0339   CL 105 07/12/2017 0339   CO2 29 07/12/2017 0339   GLUCOSE 82 07/12/2017 0339   BUN 21 (H) 07/12/2017 0339   CREATININE 1.41 (H) 07/12/2017 0339   CREATININE 1.71 (H) 10/15/2015 1050   CALCIUM 9.0 07/12/2017 0339  GFRNONAA 44 (L) 07/12/2017 0339   GFRAA 50 (L) 07/12/2017 0339    Lipid Panel  No results found for: CHOL, TRIG, HDL, CHOLHDL, VLDL, LDLCALC  CBC    Component Value Date/Time   WBC 5.8 07/12/2017 0339   RBC 4.05 (L) 07/12/2017 0339   HGB 12.5 (L) 07/12/2017 0339   HCT 36.6 (L) 07/12/2017 0339   PLT 194 07/12/2017 0339   MCV 90.4 07/12/2017 0339   MCH 30.9 07/12/2017 0339   MCHC 34.2 07/12/2017 0339   RDW 13.6 07/12/2017 0339   LYMPHSABS 1.2 07/11/2017 1630   MONOABS 0.7 07/11/2017 1630   EOSABS 0.3 07/11/2017 1630   BASOSABS 0.1 07/11/2017 1630    Hgb A1C Lab Results  Component Value Date   HGBA1C 5.7 (H) 03/13/2013           Assessment & Plan:

## 2018-03-28 NOTE — Patient Instructions (Signed)

## 2018-04-07 LAB — POCT INR: INR: 2.8

## 2018-04-08 ENCOUNTER — Ambulatory Visit: Payer: Self-pay

## 2018-04-08 DIAGNOSIS — I480 Paroxysmal atrial fibrillation: Secondary | ICD-10-CM

## 2018-04-08 DIAGNOSIS — Z7901 Long term (current) use of anticoagulants: Secondary | ICD-10-CM

## 2018-04-08 NOTE — Patient Instructions (Signed)
INR  On 5/17: 2.8.  Report from [facility].  Patient is to continue taking 2.5mg  daily EXCEPT for 5mg  on Sundays only and recheck in 3-4 weeks.    Notified Austin Miles, RN at [facility] who assists with managing his medications.

## 2018-04-20 ENCOUNTER — Ambulatory Visit: Payer: Medicare Other | Admitting: Internal Medicine

## 2018-04-20 DIAGNOSIS — M150 Primary generalized (osteo)arthritis: Secondary | ICD-10-CM | POA: Diagnosis not present

## 2018-04-20 DIAGNOSIS — M159 Polyosteoarthritis, unspecified: Secondary | ICD-10-CM

## 2018-04-20 DIAGNOSIS — M1A3420 Chronic gout due to renal impairment, left hand, without tophus (tophi): Secondary | ICD-10-CM | POA: Diagnosis not present

## 2018-04-20 DIAGNOSIS — G4733 Obstructive sleep apnea (adult) (pediatric): Secondary | ICD-10-CM

## 2018-04-20 DIAGNOSIS — N183 Chronic kidney disease, stage 3 unspecified: Secondary | ICD-10-CM

## 2018-04-20 DIAGNOSIS — I5032 Chronic diastolic (congestive) heart failure: Secondary | ICD-10-CM | POA: Diagnosis not present

## 2018-04-20 DIAGNOSIS — I480 Paroxysmal atrial fibrillation: Secondary | ICD-10-CM | POA: Diagnosis not present

## 2018-04-20 DIAGNOSIS — Z9989 Dependence on other enabling machines and devices: Secondary | ICD-10-CM

## 2018-04-22 ENCOUNTER — Encounter: Payer: Self-pay | Admitting: Internal Medicine

## 2018-04-22 NOTE — Assessment & Plan Note (Signed)
Rate and rhythm controlled Continue Coumadin.

## 2018-04-22 NOTE — Assessment & Plan Note (Signed)
Continue CPAP.  

## 2018-04-22 NOTE — Assessment & Plan Note (Signed)
Compensated Continue Lasix

## 2018-04-22 NOTE — Patient Instructions (Signed)
Heart Failure °Heart failure means your heart has trouble pumping blood. This makes it hard for your body to work well. Heart failure is usually a long-term (chronic) condition. You must take good care of yourself and follow your doctor's treatment plan. °Follow these instructions at home: °· Take your heart medicine as told by your doctor. °? Do not stop taking medicine unless your doctor tells you to. °? Do not skip any dose of medicine. °? Refill your medicines before they run out. °? Take other medicines only as told by your doctor or pharmacist. °· Stay active if told by your doctor. The elderly and people with severe heart failure should talk with a doctor about physical activity. °· Eat heart-healthy foods. Choose foods that are without trans fat and are low in saturated fat, cholesterol, and salt (sodium). This includes fresh or frozen fruits and vegetables, fish, lean meats, fat-free or low-fat dairy foods, whole grains, and high-fiber foods. Lentils and dried peas and beans (legumes) are also good choices. °· Limit salt if told by your doctor. °· Cook in a healthy way. Roast, grill, broil, bake, poach, steam, or stir-fry foods. °· Limit fluids as told by your doctor. °· Weigh yourself every morning. Do this after you pee (urinate) and before you eat breakfast. Write down your weight to give to your doctor. °· Take your blood pressure and write it down if your doctor tells you to. °· Ask your doctor how to check your pulse. Check your pulse as told. °· Lose weight if told by your doctor. °· Stop smoking or chewing tobacco. Do not use gum or patches that help you quit without your doctor's approval. °· Schedule and go to doctor visits as told. °· Nonpregnant women should have no more than 1 drink a day. Men should have no more than 2 drinks a day. Talk to your doctor about drinking alcohol. °· Stop illegal drug use. °· Stay current with shots (immunizations). °· Manage your health conditions as told by your  doctor. °· Learn to manage your stress. °· Rest when you are tired. °· If it is really hot outside: °? Avoid intense activities. °? Use air conditioning or fans, or get in a cooler place. °? Avoid caffeine and alcohol. °? Wear loose-fitting, lightweight, and light-colored clothing. °· If it is really cold outside: °? Avoid intense activities. °? Layer your clothing. °? Wear mittens or gloves, a hat, and a scarf when going outside. °? Avoid alcohol. °· Learn about heart failure and get support as needed. °· Get help to maintain or improve your quality of life and your ability to care for yourself as needed. °Contact a doctor if: °· You gain weight quickly. °· You are more short of breath than usual. °· You cannot do your normal activities. °· You tire easily. °· You cough more than normal, especially with activity. °· You have any or more puffiness (swelling) in areas such as your hands, feet, ankles, or belly (abdomen). °· You cannot sleep because it is hard to breathe. °· You feel like your heart is beating fast (palpitations). °· You get dizzy or light-headed when you stand up. °Get help right away if: °· You have trouble breathing. °· There is a change in mental status, such as becoming less alert or not being able to focus. °· You have chest pain or discomfort. °· You faint. °This information is not intended to replace advice given to you by your health care provider. Make sure you   discuss any questions you have with your health care provider. °Document Released: 08/18/2008 Document Revised: 04/16/2016 Document Reviewed: 12/26/2012 °Elsevier Interactive Patient Education © 2017 [employer] Inc. ° °

## 2018-04-22 NOTE — Assessment & Plan Note (Signed)
Monitor creatinine 

## 2018-04-22 NOTE — Progress Notes (Signed)
Subjective:    Patient ID: [name], male    DOB: 03/29/1929, 84 y.o.   MRN: [id number]  HPI  Routine visit for resident in [address] No new concerns from RN or resident Sleeps well with CPAP, Independent with ADL's, walks without assist. Appetite good, weight stable. He reports occassionally constipation, but voids okay. Denies pain, reflux or SOB.  Osteoarthritis: Min his back. Pain controlled with Tylenol.  Afib: s/p MAZE procedure. On Coumadin.  CHF, Diastolic: Some lower extremity edema. He takes Lasix as prescribed and wears compression hose.  CKD 3: Last creatinine 1.67. He is not on an ACEI or an ARB.  OSA: Sleeping better with new CPAP.  Gout: He takes Allopurinol as prescribed and Colchine as needed for flares.  Review of Systems      Past Medical History:  Diagnosis Date  . AAA (abdominal aortic aneurysm) without rupture ([place]) 03/03/2013   Small, partially thrombosed saccular aneurysm  . Arthritis    HANDS  . Atrial fibrillation ([place]) 02/10/2013  . Cancer ([place])    SKIN CANCERS  . Chronic diastolic congestive heart failure ([place]) 03/20/2013  . Chronic kidney disease, stage III (moderate) ([facility])   . Constipation   . Erectile dysfunction   . Hearing loss    BILATERAL - WEARS HEARING AIDS  . History of torn meniscus of left knee    PAINFUL LEFT KNEE  . Mitral valve prolapse   . Mitral valve regurgitation   . OSA on CPAP   . S/P Maze operation for atrial fibrillation 03/15/2013   Complete bilateral lesion set using cryothermy and bipolar radiofrequency ablation with oversewing of LA appendage via right mini thoracotomy  . Spinal stenosis, thoracic 03/03/2013   T5-6    Current Outpatient Medications  Medication Sig Dispense Refill  . acetaminophen (TYLENOL) 650 MG CR tablet Take 650 mg by mouth 3 (three) times daily.    Marland Kitchen allopurinol (ZYLOPRIM) 100 MG tablet Take 100 mg by mouth daily.    . cetirizine (ZYRTEC) 10 MG tablet Take 10 mg by mouth daily.     .  cholecalciferol (VITAMIN D) 1000 UNITS tablet Take 1,000 Units by mouth every morning. Reported on 03/19/2016    . cyanocobalamin (,VITAMIN B-12,) 1000 MCG/ML injection Inject 1,000 mcg into the muscle every 14 (fourteen) days.     Marland Kitchen docusate sodium (COLACE) 100 MG capsule Take 100 mg by mouth 2 (two) times daily.    . furosemide (LASIX) 20 MG tablet Take 1 tablet (20 mg total) by mouth every other day. 30 tablet 0  . polyethylene glycol (MIRALAX / GLYCOLAX) packet Take 17 g by mouth daily.    Marland Kitchen warfarin (COUMADIN) 5 MG tablet TAKE AS DIRECTED BY COUMADIN CLINIC 30 tablet 3  . senna-docusate (SENOKOT-S) 8.6-50 MG tablet Take 2 tablets by mouth daily. (Patient not taking: Reported on 04/22/2018) 60 tablet 11  . sodium chloride (OCEAN) 0.65 % SOLN nasal spray Place 1 spray into both nostrils as needed for congestion.     No current facility-administered medications for this visit.     Allergies  Allergen Reactions  . Statins     Muscle pains  . Latex Rash    bandaids--no other latex problems    Family History  Problem Relation Age of Onset  . Coronary artery disease Father   . Heart disease Father   . Heart attack Father   . Diabetes Mother   . Arthritis Mother   . Congestive Heart Failure Mother   .  Alzheimer's disease Mother   . Alzheimer's disease Sister     Social History   Socioeconomic History  . Marital status: Widowed    Spouse name: Not on file  . Number of children: 2  . Years of education: Not on file  . Highest education level: Not on file  Occupational History  . Occupation: retired Art gallery manager  Social Needs  . Financial resource strain: Not on file  . Food insecurity:    Worry: Not on file    Inability: Not on file  . Transportation needs:    Medical: Not on file    Non-medical: Not on file  Tobacco Use  . Smoking status: Former Smoker    Packs/day: 1.00    Years: 20.00    Pack years: 20.00    Start date: 11/23/1960  . Smokeless tobacco: Never  Used  . Tobacco comment: QUIT SMOKING 1962  Substance and Sexual Activity  . Alcohol use: Yes    Alcohol/week: 4.2 oz    Types: 7 Standard drinks or equivalent per week    Comment: Cocktail or glass of wine with dinner  . Drug use: No  . Sexual activity: Not on file  Lifestyle  . Physical activity:    Days per week: Not on file    Minutes per session: Not on file  . Stress: Not on file  Relationships  . Social connections:    Talks on phone: Not on file    Gets together: Not on file    Attends religious service: Not on file    Active member of club or organization: Not on file    Attends meetings of clubs or organizations: Not on file    Relationship status: Not on file  . Intimate partner violence:    Fear of current or ex partner: Not on file    Emotionally abused: Not on file    Physically abused: Not on file    Forced sexual activity: Not on file  Other Topics Concern  . Not on file  Social History Narrative   Has living will   Daughter [name] is health care POA   Would accept resuscitation attempts but no prolonged ventilation   Not sure about tube feeds     Constitutional: Denies fever, malaise, fatigue, headache or abrupt weight changes.  HEENT: Denies eye pain, eye redness, ear pain, ringing in the ears, wax buildup, runny nose, nasal congestion, bloody nose, or sore throat. Respiratory: Denies difficulty breathing, shortness of breath, cough or sputum production.   Cardiovascular: Pt reports swelling in legs. Denies chest pain, chest tightness, palpitations or swelling in the hands.  Gastrointestinal: Pt reports constipation. Denies abdominal pain, bloating, diarrhea or blood in the stool.  GU: Denies urgency, frequency, pain with urination, burning sensation, blood in urine, odor or discharge. Musculoskeletal: Pt reports intermittent back pain. Denies decrease in range of motion, difficulty with gait, muscle pain or joint swelling.  Skin: Denies redness, rashes,  lesions or ulcercations.  Neurological: Denies dizziness, difficulty with memory, difficulty with speech or problems with balance and coordination.  Psych: Denies anxiety, depression, SI/HI.  No other specific complaints in a complete review of systems (except as listed in HPI above).  Objective:   Physical Exam   BP 130/65   Pulse 68   Resp 18   Wt 174 lb 12.8 oz (79.3 kg)   BMI 25.08 kg/m  Wt Readings from Last 3 Encounters:  04/22/18 174 lb 12.8 oz (79.3 kg)  03/28/18  136 lb (61.7 kg)  02/18/18 167 lb (75.8 kg)    General: Appears his stated age, well developed, well nourished in NAD. Skin: Warm, dry and intact.  Neck:  No nodes or JVD Cardiovascular: Normal rate and rhythm. S1,S2 noted.  No murmur, rubs or gallops noted. 1+ BLE, L>R. Pulmonary/Chest: Normal effort and positive vesicular breath sounds. No respiratory distress. No wheezes, rales or ronchi noted.  Abdomen: Soft and nontender. Normal bowel sounds.  Musculoskeletal:  No difficulty with gait.  Neurological: Alert and oriented.    BMET    Component Value Date/Time   NA 141 07/12/2017 0339   K 3.9 07/12/2017 0339   CL 105 07/12/2017 0339   CO2 29 07/12/2017 0339   GLUCOSE 82 07/12/2017 0339   BUN 21 (H) 07/12/2017 0339   CREATININE 1.41 (H) 07/12/2017 0339   CREATININE 1.71 (H) 10/15/2015 1050   CALCIUM 9.0 07/12/2017 0339   GFRNONAA 44 (L) 07/12/2017 0339   GFRAA 50 (L) 07/12/2017 0339    Lipid Panel  No results found for: CHOL, TRIG, HDL, CHOLHDL, VLDL, LDLCALC  CBC    Component Value Date/Time   WBC 5.8 07/12/2017 0339   RBC 4.05 (L) 07/12/2017 0339   HGB 12.5 (L) 07/12/2017 0339   HCT 36.6 (L) 07/12/2017 0339   PLT 194 07/12/2017 0339   MCV 90.4 07/12/2017 0339   MCH 30.9 07/12/2017 0339   MCHC 34.2 07/12/2017 0339   RDW 13.6 07/12/2017 0339   LYMPHSABS 1.2 07/11/2017 1630   MONOABS 0.7 07/11/2017 1630   EOSABS 0.3 07/11/2017 1630   BASOSABS 0.1 07/11/2017 1630    Hgb A1C Lab  Results  Component Value Date   HGBA1C 5.7 (H) 03/13/2013           Assessment & Plan:

## 2018-04-22 NOTE — Assessment & Plan Note (Signed)
Continue Tylelnol

## 2018-04-22 NOTE — Assessment & Plan Note (Signed)
Continue Allopurinol and Colchicine

## 2018-05-05 ENCOUNTER — Ambulatory Visit: Payer: Self-pay

## 2018-05-05 DIAGNOSIS — Z7901 Long term (current) use of anticoagulants: Secondary | ICD-10-CM

## 2018-05-05 DIAGNOSIS — I480 Paroxysmal atrial fibrillation: Secondary | ICD-10-CM

## 2018-05-05 LAB — POCT INR: INR: 2.7 (ref 2.0–3.0)

## 2018-05-13 NOTE — Patient Instructions (Signed)
INR  On 6/13: 2.7.  Report from [facility].  Patient is to continue taking 2.5mg  daily EXCEPT for 5mg  on Sundays only and recheck in 3-4 weeks.    Notified Austin Miles, RN at [facility] who assists with managing his medications.

## 2018-05-20 ENCOUNTER — Ambulatory Visit (INDEPENDENT_AMBULATORY_CARE_PROVIDER_SITE_OTHER): Payer: Medicare Other | Admitting: Internal Medicine

## 2018-05-20 VITALS — BP 121/64 | HR 66 | Temp 98.1°F | Resp 20 | Wt 170.0 lb

## 2018-05-20 DIAGNOSIS — Z4802 Encounter for removal of sutures: Secondary | ICD-10-CM | POA: Diagnosis not present

## 2018-05-28 ENCOUNTER — Encounter: Payer: Self-pay | Admitting: Internal Medicine

## 2018-05-28 NOTE — Progress Notes (Signed)
Subjective:    Patient ID: [name], male    DOB: 1929/03/07, 84 y.o.   MRN: [id number]  HPI  Asked to see resident in [address] for suture removal Hand cancerous areas to RLL excised, placed running sutures RN reports bleeding every time she tries to remove  Review of Systems  Past Medical History:  Diagnosis Date  . AAA (abdominal aortic aneurysm) without rupture ([place]) 03/03/2013   Small, partially thrombosed saccular aneurysm  . Arthritis    HANDS  . Atrial fibrillation ([place]) 02/10/2013  . Cancer ([place])    SKIN CANCERS  . Chronic diastolic congestive heart failure ([place]) 03/20/2013  . Chronic kidney disease, stage III (moderate) ([facility])   . Constipation   . Erectile dysfunction   . Hearing loss    BILATERAL - WEARS HEARING AIDS  . History of torn meniscus of left knee    PAINFUL LEFT KNEE  . Mitral valve prolapse   . Mitral valve regurgitation   . OSA on CPAP   . S/P Maze operation for atrial fibrillation 03/15/2013   Complete bilateral lesion set using cryothermy and bipolar radiofrequency ablation with oversewing of LA appendage via right mini thoracotomy  . Spinal stenosis, thoracic 03/03/2013   T5-6    Current Outpatient Medications  Medication Sig Dispense Refill  . acetaminophen (TYLENOL) 650 MG CR tablet Take 650 mg by mouth 3 (three) times daily.    Marland Kitchen allopurinol (ZYLOPRIM) 100 MG tablet Take 100 mg by mouth daily.    . cetirizine (ZYRTEC) 10 MG tablet Take 10 mg by mouth daily.     . cholecalciferol (VITAMIN D) 1000 UNITS tablet Take 1,000 Units by mouth every morning. Reported on 03/19/2016    . cyanocobalamin (,VITAMIN B-12,) 1000 MCG/ML injection Inject 1,000 mcg into the muscle every 14 (fourteen) days.     Marland Kitchen docusate sodium (COLACE) 100 MG capsule Take 100 mg by mouth 2 (two) times daily.    . furosemide (LASIX) 20 MG tablet Take 1 tablet (20 mg total) by mouth every other day. 30 tablet 0  . polyethylene glycol (MIRALAX / GLYCOLAX) packet Take 17 g by  mouth daily.    Marland Kitchen senna-docusate (SENOKOT-S) 8.6-50 MG tablet Take 2 tablets by mouth daily. (Patient not taking: Reported on 04/22/2018) 60 tablet 11  . sodium chloride (OCEAN) 0.65 % SOLN nasal spray Place 1 spray into both nostrils as needed for congestion.    Marland Kitchen warfarin (COUMADIN) 5 MG tablet TAKE AS DIRECTED BY COUMADIN CLINIC 30 tablet 3   No current facility-administered medications for this visit.     Allergies  Allergen Reactions  . Statins     Muscle pains  . Latex Rash    bandaids--no other latex problems    Family History  Problem Relation Age of Onset  . Coronary artery disease Father   . Heart disease Father   . Heart attack Father   . Diabetes Mother   . Arthritis Mother   . Congestive Heart Failure Mother   . Alzheimer's disease Mother   . Alzheimer's disease Sister     Social History   Socioeconomic History  . Marital status: Widowed    Spouse name: Not on file  . Number of children: 2  . Years of education: Not on file  . Highest education level: Not on file  Occupational History  . Occupation: retired Art gallery manager  Social Needs  . Financial resource strain: Not on file  . Food insecurity:  Worry: Not on file    Inability: Not on file  . Transportation needs:    Medical: Not on file    Non-medical: Not on file  Tobacco Use  . Smoking status: Former Smoker    Packs/day: 1.00    Years: 20.00    Pack years: 20.00    Start date: 11/23/1960  . Smokeless tobacco: Never Used  . Tobacco comment: QUIT SMOKING 1962  Substance and Sexual Activity  . Alcohol use: Yes    Alcohol/week: 4.2 oz    Types: 7 Standard drinks or equivalent per week    Comment: Cocktail or glass of wine with dinner  . Drug use: No  . Sexual activity: Not on file  Lifestyle  . Physical activity:    Days per week: Not on file    Minutes per session: Not on file  . Stress: Not on file  Relationships  . Social connections:    Talks on phone: Not on file    Gets  together: Not on file    Attends religious service: Not on file    Active member of club or organization: Not on file    Attends meetings of clubs or organizations: Not on file    Relationship status: Not on file  . Intimate partner violence:    Fear of current or ex partner: Not on file    Emotionally abused: Not on file    Physically abused: Not on file    Forced sexual activity: Not on file  Other Topics Concern  . Not on file  Social History Narrative   Has living will   Daughter [name] is health care POA   Would accept resuscitation attempts but no prolonged ventilation   Not sure about tube feeds     Constitutional: Denies fever, malaise, fatigue, headache or abrupt weight changes.  Skin: RN reports incision to RLE. Denies redness, rashes, lesions or ulcercations.    No other specific complaints in a complete review of systems (except as listed in HPI above).     Objective:   Physical Exam  BP 121/64   Pulse 66   Temp 98.1 F (36.7 C)   Resp 20   Wt 170 lb (77.1 kg)   BMI 24.39 kg/m   Wt Readings from Last 3 Encounters:  04/22/18 174 lb 12.8 oz (79.3 kg)  03/28/18 136 lb (61.7 kg)  02/18/18 167 lb (75.8 kg)    General: Appears his stated age, well developed, well nourished in NAD. Skin: 4 cm incision to RLE.  BMET    Component Value Date/Time   NA 141 07/12/2017 0339   K 3.9 07/12/2017 0339   CL 105 07/12/2017 0339   CO2 29 07/12/2017 0339   GLUCOSE 82 07/12/2017 0339   BUN 21 (H) 07/12/2017 0339   CREATININE 1.41 (H) 07/12/2017 0339   CREATININE 1.71 (H) 10/15/2015 1050   CALCIUM 9.0 07/12/2017 0339   GFRNONAA 44 (L) 07/12/2017 0339   GFRAA 50 (L) 07/12/2017 0339    Lipid Panel  No results found for: CHOL, TRIG, HDL, CHOLHDL, VLDL, LDLCALC  CBC    Component Value Date/Time   WBC 5.8 07/12/2017 0339   RBC 4.05 (L) 07/12/2017 0339   HGB 12.5 (L) 07/12/2017 0339   HCT 36.6 (L) 07/12/2017 0339   PLT 194 07/12/2017 0339   MCV 90.4 07/12/2017  0339   MCH 30.9 07/12/2017 0339   MCHC 34.2 07/12/2017 0339   RDW 13.6 07/12/2017 0339   LYMPHSABS  1.2 07/11/2017 1630   MONOABS 0.7 07/11/2017 1630   EOSABS 0.3 07/11/2017 1630   BASOSABS 0.1 07/11/2017 1630    Hgb A1C Lab Results  Component Value Date   HGBA1C 5.7 (H) 03/13/2013            Assessment & Plan:   Encounter for Suture Removal:  Suture removed by provider Small amount of bleeding controlled with gauze and pressure Pt tolerated procedure well  Will reassess as needed Webb Silversmith, NP

## 2018-05-28 NOTE — Patient Instructions (Signed)
Suture Removal, Care After Refer to this sheet in the next few weeks. These instructions provide you with information on caring for yourself after your procedure. Your health care provider may also give you more specific instructions. Your treatment has been planned according to current medical practices, but problems sometimes occur. Call your health care provider if you have any problems or questions after your procedure. What can I expect after the procedure? After your stitches (sutures) are removed, it is typical to have the following:  Some discomfort and swelling in the wound area.  Slight redness in the area.  Follow these instructions at home:  If you have skin adhesive strips over the wound area, do not take the strips off. They will fall off on their own in a few days. If the strips remain in place after 14 days, you may remove them.  Change any bandages (dressings) at least once a day or as directed by your health care provider. If the bandage sticks, soak it off with warm, soapy water.  Apply cream or ointment only as directed by your health care provider. If using cream or ointment, wash the area with soap and water 2 times a day to remove all the cream or ointment. Rinse off the soap and pat the area dry with a clean towel.  Keep the wound area dry and clean. If the bandage becomes wet or dirty, or if it develops a bad smell, change it as soon as possible.  Continue to protect the wound from injury.  Use sunscreen when out in the sun. New scars become sunburned easily. Contact a health care provider if:  You have increasing redness, swelling, or pain in the wound.  You see pus coming from the wound.  You have a fever.  You notice a bad smell coming from the wound or dressing.  Your wound breaks open (edges not staying together). This information is not intended to replace advice given to you by your health care provider. Make sure you discuss any questions you have  with your health care provider. Document Released: 08/04/2001 Document Revised: 04/16/2016 Document Reviewed: 06/21/2013 Elsevier Interactive Patient Education  2017 [employer] Inc.  

## 2018-06-02 LAB — POCT INR: INR: 2.1 (ref 2.0–3.0)

## 2018-06-03 ENCOUNTER — Ambulatory Visit: Payer: Self-pay

## 2018-06-03 DIAGNOSIS — Z7901 Long term (current) use of anticoagulants: Secondary | ICD-10-CM

## 2018-06-03 DIAGNOSIS — I480 Paroxysmal atrial fibrillation: Secondary | ICD-10-CM

## 2018-06-07 LAB — POCT INR: INR: 2.1 (ref 2.0–3.0)

## 2018-06-07 NOTE — Patient Instructions (Signed)
INR  On 7/12 2.1.  Report from [facility].  Patient is to continue taking 2.5mg  daily EXCEPT for 5mg  on Sundays only and recheck in 4 weeks.    Notified Austin Miles, RN on 06/03/18 at [facility] who assists with managing his medications.

## 2018-06-15 ENCOUNTER — Ambulatory Visit: Payer: Medicare Other | Admitting: Internal Medicine

## 2018-07-01 ENCOUNTER — Ambulatory Visit: Payer: Self-pay

## 2018-07-01 DIAGNOSIS — I480 Paroxysmal atrial fibrillation: Secondary | ICD-10-CM

## 2018-07-01 DIAGNOSIS — Z7901 Long term (current) use of anticoagulants: Secondary | ICD-10-CM

## 2018-07-01 LAB — POCT INR: INR: 2.1 (ref 2.0–3.0)

## 2018-07-01 NOTE — Patient Instructions (Signed)
INR  On 8/9 2.1.  Report from [facility].  Patient is to continue taking 2.5mg  daily EXCEPT for 5mg  on Sundays only and recheck in 4 weeks.    Notified Austin Miles, RN at [facility] who assists with managing his medications.

## 2018-07-21 ENCOUNTER — Encounter: Payer: Self-pay | Admitting: Internal Medicine

## 2018-07-21 ENCOUNTER — Ambulatory Visit: Payer: Medicare Other | Admitting: Internal Medicine

## 2018-07-21 VITALS — BP 107/60 | HR 58 | Temp 98.0°F | Resp 20 | Wt 169.8 lb

## 2018-07-21 DIAGNOSIS — I5032 Chronic diastolic (congestive) heart failure: Secondary | ICD-10-CM

## 2018-07-21 DIAGNOSIS — M159 Polyosteoarthritis, unspecified: Secondary | ICD-10-CM

## 2018-07-21 DIAGNOSIS — M1A3420 Chronic gout due to renal impairment, left hand, without tophus (tophi): Secondary | ICD-10-CM

## 2018-07-21 DIAGNOSIS — I480 Paroxysmal atrial fibrillation: Secondary | ICD-10-CM

## 2018-07-21 DIAGNOSIS — M150 Primary generalized (osteo)arthritis: Secondary | ICD-10-CM

## 2018-07-21 DIAGNOSIS — N183 Chronic kidney disease, stage 3 unspecified: Secondary | ICD-10-CM

## 2018-07-21 NOTE — Assessment & Plan Note (Signed)
GFR has been stable No Rx due to age/stablity

## 2018-07-21 NOTE — Assessment & Plan Note (Signed)
No recent exacerbations on the low dose allopurinol

## 2018-07-21 NOTE — Assessment & Plan Note (Signed)
Well controlled now on just the tylenol

## 2018-07-21 NOTE — Assessment & Plan Note (Signed)
Still regular Recommended change of warfarin to apixaban for increased safety. He was uncomfortable with this Will continue the warfarin

## 2018-07-21 NOTE — Assessment & Plan Note (Signed)
Compensated on furosemide No changes needed

## 2018-07-21 NOTE — Progress Notes (Signed)
Subjective:    Patient ID: [name], male    DOB: March 14, 1929, 84 y.o.   MRN: [id number]  HPI Visit in [place] apartment for review of chronic health conditions Reviewed status with Luellen Pucker RN---no new concerns  He feels things are fine now Did have skin cancer removed recently---slow healing (on right leg) Easy bruising due to coumadin  No palpitations No chest pain or SOB No dizziness or  No edema Walks fairly well--uses cane for balance Tries to walk around pond most days (multiple laps)  No sig joint pain No problems with back Regular tylenol  AAA checked in spring Stable  GFR has been stable around 40  Current Outpatient Medications on File Prior to Visit  Medication Sig Dispense Refill  . acetaminophen (TYLENOL) 650 MG CR tablet Take 650 mg by mouth 3 (three) times daily.    Marland Kitchen allopurinol (ZYLOPRIM) 100 MG tablet Take 100 mg by mouth daily.    . cetirizine (ZYRTEC) 10 MG tablet Take 10 mg by mouth daily.     . cholecalciferol (VITAMIN D) 1000 UNITS tablet Take 1,000 Units by mouth every morning. Reported on 03/19/2016    . cyanocobalamin (,VITAMIN B-12,) 1000 MCG/ML injection Inject 1,000 mcg into the muscle every 14 (fourteen) days.     . furosemide (LASIX) 40 MG tablet Take 40 mg by mouth daily.    . polyethylene glycol (MIRALAX / GLYCOLAX) packet Take 17 g by mouth daily.    . potassium chloride (K-DUR) 10 MEQ tablet Take 10 mEq by mouth daily.    Marland Kitchen senna-docusate (SENOKOT-S) 8.6-50 MG tablet Take 2 tablets by mouth daily. 60 tablet 11  . sodium chloride (OCEAN) 0.65 % SOLN nasal spray Place 1 spray into both nostrils as needed for congestion.    Marland Kitchen warfarin (COUMADIN) 5 MG tablet TAKE AS DIRECTED BY COUMADIN CLINIC 30 tablet 3   No current facility-administered medications on file prior to visit.     Allergies  Allergen Reactions  . Statins     Muscle pains  . Latex Rash    bandaids--no other latex problems    Past Medical History:  Diagnosis Date  .  AAA (abdominal aortic aneurysm) without rupture ([place]) 03/03/2013   Small, partially thrombosed saccular aneurysm  . Arthritis    HANDS  . Atrial fibrillation ([place]) 02/10/2013  . Cancer ([place])    SKIN CANCERS  . Chronic diastolic congestive heart failure ([place]) 03/20/2013  . Chronic kidney disease, stage III (moderate) (H[facility])   . Constipation   . Erectile dysfunction   . Hearing loss    BILATERAL - WEARS HEARING AIDS  . History of torn meniscus of left knee    PAINFUL LEFT KNEE  . Mitral valve prolapse   . Mitral valve regurgitation   . OSA on CPAP   . S/P Maze operation for atrial fibrillation 03/15/2013   Complete bilateral lesion set using cryothermy and bipolar radiofrequency ablation with oversewing of LA appendage via right mini thoracotomy  . Spinal stenosis, thoracic 03/03/2013   T5-6    Past Surgical History:  Procedure Laterality Date  . CARPAL TUNNEL RELEASE     BILATERAL  . [place]  . INTRAOPERATIVE TRANSESOPHAGEAL ECHOCARDIOGRAM Right 03/15/2013   Procedure: INTRAOPERATIVE TRANSESOPHAGEAL ECHOCARDIOGRAM;  Surgeon: Rexene Alberts, MD;  Location: [facility];  Service: Open Heart Surgery;  Laterality: Right;  . MINIMALLY INVASIVE MAZE PROCEDURE Right 03/15/2013   Procedure: MINIMALLY INVASIVE MAZE PROCEDURE;  Surgeon: Braulio Conte  Keturah Barre, MD;  Location: [facility];  Service: Open Heart Surgery;  Laterality: Right;  (R) AXILLARY CANNULATION  . MITRAL VALVE REPAIR Right 03/15/2013   Procedure: MINIMALLY INVASIVE MITRAL VALVE REPAIR (MVR);  Surgeon: Rexene Alberts, MD;  Location: [facility];  Service: Open Heart Surgery;  Laterality: Right;  . RIGHT ROTATOR CUFF REPAIR    . SKIN CANCERS REMOVED FROM BOTH LEGS    . SKIN CANCERS REMOVED FROM LEFT FOREHEAD AND LEFT NOSE    . SURGERY LEFT NECK FOR REMOVAL OF METASTATIC LYMPH NODE ( FROM CANCER LEFT FOREHEAD)  AND LEFT NECK DISSECTION-REST OF LYMPH NODES NEGATIVE FOR ANY CANCER    . TEE WITHOUT CARDIOVERSION N/A 01/31/2013    Procedure: TRANSESOPHAGEAL ECHOCARDIOGRAM (TEE);  Surgeon: Sueanne Margarita, MD;  Location: [facility];  Service: Cardiovascular;  Laterality: N/A;    Family History  Problem Relation Age of Onset  . Coronary artery disease Father   . Heart disease Father   . Heart attack Father   . Diabetes Mother   . Arthritis Mother   . Congestive Heart Failure Mother   . Alzheimer's disease Mother   . Alzheimer's disease Sister     Social History   Socioeconomic History  . Marital status: Widowed    Spouse name: Not on file  . Number of children: 2  . Years of education: Not on file  . Highest education level: Not on file  Occupational History  . Occupation: retired Art gallery manager  Social Needs  . Financial resource strain: Not on file  . Food insecurity:    Worry: Not on file    Inability: Not on file  . Transportation needs:    Medical: Not on file    Non-medical: Not on file  Tobacco Use  . Smoking status: Former Smoker    Packs/day: 1.00    Years: 20.00    Pack years: 20.00    Start date: 11/23/1960  . Smokeless tobacco: Never Used  . Tobacco comment: QUIT SMOKING 1962  Substance and Sexual Activity  . Alcohol use: Yes    Alcohol/week: 7.0 standard drinks    Types: 7 Standard drinks or equivalent per week    Comment: Cocktail or glass of wine with dinner  . Drug use: No  . Sexual activity: Not on file  Lifestyle  . Physical activity:    Days per week: Not on file    Minutes per session: Not on file  . Stress: Not on file  Relationships  . Social connections:    Talks on phone: Not on file    Gets together: Not on file    Attends religious service: Not on file    Active member of club or organization: Not on file    Attends meetings of clubs or organizations: Not on file    Relationship status: Not on file  . Intimate partner violence:    Fear of current or ex partner: Not on file    Emotionally abused: Not on file    Physically abused: Not on file    Forced  sexual activity: Not on file  Other Topics Concern  . Not on file  Social History Narrative   Has living will   Daughter [name] is health care POA   Would accept resuscitation attempts but no prolonged ventilation   Not sure about tube feeds   Review of Systems Sleeps well with the CPAP. Sleeps flat --no PND Appetite is fine Weight fairly stable (down slightly--?water)  Bowels are slow--does okay with miralax and senna    Objective:   Physical Exam  Constitutional: He appears well-developed. No distress.  Neck: No thyromegaly present.  Cardiovascular: Normal rate, regular rhythm and normal heart sounds. Exam reveals no gallop.  No murmur heard. Respiratory: Effort normal and breath sounds normal. No respiratory distress. He has no wheezes. He has no rales.  GI: Soft. There is no tenderness.  Musculoskeletal:  Slight calf edema  Lymphadenopathy:    He has no cervical adenopathy.  Skin:  Multiple ecchymoses  Psychiatric: He has a normal mood and affect. His behavior is normal.           Assessment & Plan:

## 2018-07-28 LAB — POCT INR: INR: 2.2 (ref 2.0–3.0)

## 2018-07-29 ENCOUNTER — Ambulatory Visit: Payer: Self-pay

## 2018-07-29 DIAGNOSIS — I480 Paroxysmal atrial fibrillation: Secondary | ICD-10-CM

## 2018-07-29 DIAGNOSIS — Z7901 Long term (current) use of anticoagulants: Secondary | ICD-10-CM

## 2018-07-29 NOTE — Patient Instructions (Signed)
INR  On 9/6: 2.2.  Report from [facility].  Patient is to continue taking 2.5mg  daily EXCEPT for 5mg  on Sundays only and recheck in 4 weeks.    Notified Austin Miles, RN at [facility] who assists with managing his medications.

## 2018-08-25 ENCOUNTER — Ambulatory Visit: Payer: Self-pay

## 2018-08-25 DIAGNOSIS — Z7901 Long term (current) use of anticoagulants: Secondary | ICD-10-CM

## 2018-08-25 DIAGNOSIS — I480 Paroxysmal atrial fibrillation: Secondary | ICD-10-CM

## 2018-08-25 LAB — POCT INR: INR: 2 (ref 2.0–3.0)

## 2018-08-26 LAB — POCT INR: INR: 2 (ref 2.0–3.0)

## 2018-08-26 NOTE — Patient Instructions (Signed)
NR  On 10/3: 2.0 from [facility] Nurse  Patient is to continue taking 2.5mg  daily EXCEPT for 5mg  on Sundays only and recheck in 4 weeks.    Notified Austin Miles, RN at [facility] who assists with managing his medications.

## 2018-09-26 ENCOUNTER — Ambulatory Visit: Payer: Self-pay

## 2018-09-26 DIAGNOSIS — I480 Paroxysmal atrial fibrillation: Secondary | ICD-10-CM

## 2018-09-26 DIAGNOSIS — Z7901 Long term (current) use of anticoagulants: Secondary | ICD-10-CM

## 2018-09-26 LAB — POCT INR: INR: 1.9 — AB (ref 2.0–3.0)

## 2018-09-26 NOTE — Patient Instructions (Addendum)
INR  On 11/4: 1.9 from [facility] Nurse  Patient is to take 5mg  tomorrow (11/5) as already had for today and then continue taking 2.5mg  daily EXCEPT for 5mg  on Sundays only and recheck in 3 weeks.    Notified Austin Miles, RN at [facility] who assists with managing his medications.

## 2018-09-27 ENCOUNTER — Encounter: Payer: Self-pay | Admitting: Internal Medicine

## 2018-10-14 ENCOUNTER — Encounter: Payer: Self-pay | Admitting: Internal Medicine

## 2018-10-14 DIAGNOSIS — N183 Chronic kidney disease, stage 3 unspecified: Secondary | ICD-10-CM | POA: Insufficient documentation

## 2018-10-14 DIAGNOSIS — D631 Anemia in chronic kidney disease: Secondary | ICD-10-CM | POA: Insufficient documentation

## 2018-10-19 ENCOUNTER — Ambulatory Visit: Payer: Medicare Other | Admitting: Internal Medicine

## 2018-10-19 DIAGNOSIS — M1A3420 Chronic gout due to renal impairment, left hand, without tophus (tophi): Secondary | ICD-10-CM

## 2018-10-19 DIAGNOSIS — M150 Primary generalized (osteo)arthritis: Secondary | ICD-10-CM | POA: Diagnosis not present

## 2018-10-19 DIAGNOSIS — G4733 Obstructive sleep apnea (adult) (pediatric): Secondary | ICD-10-CM | POA: Diagnosis not present

## 2018-10-19 DIAGNOSIS — I480 Paroxysmal atrial fibrillation: Secondary | ICD-10-CM | POA: Diagnosis not present

## 2018-10-19 DIAGNOSIS — M159 Polyosteoarthritis, unspecified: Secondary | ICD-10-CM

## 2018-10-19 DIAGNOSIS — N183 Chronic kidney disease, stage 3 unspecified: Secondary | ICD-10-CM

## 2018-10-19 DIAGNOSIS — Z9989 Dependence on other enabling machines and devices: Secondary | ICD-10-CM

## 2018-10-19 DIAGNOSIS — I5032 Chronic diastolic (congestive) heart failure: Secondary | ICD-10-CM | POA: Diagnosis not present

## 2018-10-22 ENCOUNTER — Encounter: Payer: Self-pay | Admitting: Internal Medicine

## 2018-10-22 NOTE — Assessment & Plan Note (Signed)
Lower extremity edema On Lasix, monitor creatinine given history of CKD

## 2018-10-22 NOTE — Assessment & Plan Note (Signed)
Sleeps well Continue CPAP  Will monitor

## 2018-10-22 NOTE — Assessment & Plan Note (Signed)
On Coumadin Rate fine off meds Will monitor

## 2018-10-22 NOTE — Assessment & Plan Note (Signed)
Continue Allopurinol

## 2018-10-22 NOTE — Patient Instructions (Signed)
Edema Edema is when you have too much fluid in your body or under your skin. Edema may make your legs, feet, and ankles swell up. Swelling is also common in looser tissues, like around your eyes. This is a common condition. It gets more common as you get older. There are many possible causes of edema. Eating too much salt (sodium) and being on your feet or sitting for a long time can cause edema in your legs, feet, and ankles. Hot weather may make edema worse. Edema is usually painless. Your skin may look swollen or shiny. Follow these instructions at home:  Keep the swollen body part raised (elevated) above the level of your heart when you are sitting or lying down.  Do not sit still or stand for a long time.  Do not wear tight clothes. Do not wear garters on your upper legs.  Exercise your legs. This can help the swelling go down.  Wear elastic bandages or support stockings as told by your doctor.  Eat a low-salt (low-sodium) diet to reduce fluid as told by your doctor.  Depending on the cause of your swelling, you may need to limit how much fluid you drink (fluid restriction).  Take over-the-counter and prescription medicines only as told by your doctor. Contact a doctor if:  Treatment is not working.  You have heart, liver, or kidney disease and have symptoms of edema.  You have sudden and unexplained weight gain. Get help right away if:  You have shortness of breath or chest pain.  You cannot breathe when you lie down.  You have pain, redness, or warmth in the swollen areas.  You have heart, liver, or kidney disease and get edema all of a sudden.  You have a fever and your symptoms get worse all of a sudden. Summary  Edema is when you have too much fluid in your body or under your skin.  Edema may make your legs, feet, and ankles swell up. Swelling is also common in looser tissues, like around your eyes.  Raise (elevate) the swollen body part above the level of your  heart when you are sitting or lying down.  Follow your doctor's instructions about diet and how much fluid you can drink (fluid restriction). This information is not intended to replace advice given to you by your health care provider. Make sure you discuss any questions you have with your health care provider. Document Released: 04/27/2008 Document Revised: 11/27/2016 Document Reviewed: 11/27/2016 Elsevier Interactive Patient Education  2017 Elsevier Inc.  

## 2018-10-22 NOTE — Assessment & Plan Note (Signed)
Continue Tylenol prn 

## 2018-10-22 NOTE — Progress Notes (Signed)
Subjective:    Patient ID: [name], male    DOB: Feb 19, 1929, 84 y.o.   MRN: [id number]  HPI  Routine follow up for resident in [address]. RN reports persistent edema, wound treatment to BLE Resided c/o dry mouth He reports his appetite isn't great but no noted weight loss Sleeps well with use of CPAP Walks without device Some constipation, controlled with meds, no urinary issues Denies pain, reflux, chest pain or shortness of breath  OA: Mainly in his back. He takes Tylenol with good relief.  Afib: Chronic. Rhythm and rate controlled. On Coumadin for anticoagulation.  CHF: C/o LE edema but no SOB. He is taking Lasix and Potassium as prescribed.  CKD 3: Last Creat 1.67, 09/2018. Not on ACEI/ARB.  OSA: Sleeps well with use of CPAP.  Gout: No recent flare on Allopurinol.  Review of Systems      Past Medical History:  Diagnosis Date  . AAA (abdominal aortic aneurysm) without rupture ([place]) 03/03/2013   Small, partially thrombosed saccular aneurysm  . Arthritis    HANDS  . Atrial fibrillation ([place]) 02/10/2013  . Cancer ([place])    SKIN CANCERS  . Chronic diastolic congestive heart failure ([place]) 03/20/2013  . Chronic kidney disease, stage III (moderate) (H[facility])   . Constipation   . Erectile dysfunction   . Hearing loss    BILATERAL - WEARS HEARING AIDS  . History of torn meniscus of left knee    PAINFUL LEFT KNEE  . Mitral valve prolapse   . Mitral valve regurgitation   . OSA on CPAP   . S/P Maze operation for atrial fibrillation 03/15/2013   Complete bilateral lesion set using cryothermy and bipolar radiofrequency ablation with oversewing of LA appendage via right mini thoracotomy  . Spinal stenosis, thoracic 03/03/2013   T5-6    Current Outpatient Medications  Medication Sig Dispense Refill  . acetaminophen (TYLENOL) 650 MG CR tablet Take 650 mg by mouth 3 (three) times daily.    Marland Kitchen allopurinol (ZYLOPRIM) 100 MG tablet Take 100 mg by mouth daily.    . cetirizine  (ZYRTEC) 10 MG tablet Take 10 mg by mouth daily.     . cholecalciferol (VITAMIN D) 1000 UNITS tablet Take 1,000 Units by mouth every morning. Reported on 03/19/2016    . cyanocobalamin (,VITAMIN B-12,) 1000 MCG/ML injection Inject 1,000 mcg into the muscle every 14 (fourteen) days.     . furosemide (LASIX) 40 MG tablet Take 40 mg by mouth daily.    . polyethylene glycol (MIRALAX / GLYCOLAX) packet Take 17 g by mouth daily.    . potassium chloride (K-DUR) 10 MEQ tablet Take 10 mEq by mouth daily.    Marland Kitchen senna-docusate (SENOKOT-S) 8.6-50 MG tablet Take 2 tablets by mouth daily. 60 tablet 11  . sodium chloride (OCEAN) 0.65 % SOLN nasal spray Place 1 spray into both nostrils as needed for congestion.    Marland Kitchen warfarin (COUMADIN) 5 MG tablet TAKE AS DIRECTED BY COUMADIN CLINIC 30 tablet 3   No current facility-administered medications for this visit.     Allergies  Allergen Reactions  . Statins     Muscle pains  . Latex Rash    bandaids--no other latex problems    Family History  Problem Relation Age of Onset  . Coronary artery disease Father   . Heart disease Father   . Heart attack Father   . Diabetes Mother   . Arthritis Mother   . Congestive Heart Failure Mother   .  Alzheimer's disease Mother   . Alzheimer's disease Sister     Social History   Socioeconomic History  . Marital status: Widowed    Spouse name: Not on file  . Number of children: 2  . Years of education: Not on file  . Highest education level: Not on file  Occupational History  . Occupation: retired Art gallery manager  Social Needs  . Financial resource strain: Not on file  . Food insecurity:    Worry: Not on file    Inability: Not on file  . Transportation needs:    Medical: Not on file    Non-medical: Not on file  Tobacco Use  . Smoking status: Former Smoker    Packs/day: 1.00    Years: 20.00    Pack years: 20.00    Start date: 11/23/1960  . Smokeless tobacco: Never Used  . Tobacco comment: QUIT SMOKING  1962  Substance and Sexual Activity  . Alcohol use: Yes    Alcohol/week: 7.0 standard drinks    Types: 7 Standard drinks or equivalent per week    Comment: Cocktail or glass of wine with dinner  . Drug use: No  . Sexual activity: Not on file  Lifestyle  . Physical activity:    Days per week: Not on file    Minutes per session: Not on file  . Stress: Not on file  Relationships  . Social connections:    Talks on phone: Not on file    Gets together: Not on file    Attends religious service: Not on file    Active member of club or organization: Not on file    Attends meetings of clubs or organizations: Not on file    Relationship status: Not on file  . Intimate partner violence:    Fear of current or ex partner: Not on file    Emotionally abused: Not on file    Physically abused: Not on file    Forced sexual activity: Not on file  Other Topics Concern  . Not on file  Social History Narrative   Has living will   Daughter [name] is health care POA   Would accept resuscitation attempts but no prolonged ventilation   Not sure about tube feeds     Constitutional: Denies fever, malaise, fatigue, headache or abrupt weight changes.  HEENT: Denies eye pain, eye redness, ear pain, ringing in the ears, wax buildup, runny nose, nasal congestion, bloody nose, or sore throat. Respiratory: Denies difficulty breathing, shortness of breath, cough or sputum production.   Cardiovascular: Pt reports swelling in legs. Denies chest pain, chest tightness, palpitations or swelling in the hands.  Gastrointestinal: Pt reports constipation. Denies abdominal pain, bloating, diarrhea or blood in the stool.  GU: Denies urgency, frequency, pain with urination, burning sensation, blood in urine, odor or discharge. Musculoskeletal: Denies decrease in range of motion, difficulty with gait, muscle pain or joint pain and swelling.  Skin: Pt reports wounds to BLE. Denies redness, rashes, lesions or ulcercations.    Neurological: Denies dizziness, difficulty with memory, difficulty with speech or problems with balance and coordination.  Psych: Denies anxiety, depression, SI/HI.  No other specific complaints in a complete review of systems (except as listed in HPI above).  Objective:   Physical Exam  BP 115/66   Pulse 64   Temp 97.9 F (36.6 C)   Resp 18   Wt 176 lb 1.6 oz (79.9 kg)   BMI 25.27 kg/m  Wt Readings from Last 3 Encounters:  10/22/18 176 lb 1.6 oz (79.9 kg)  07/21/18 169 lb 12.8 oz (77 kg)  05/28/18 170 lb (77.1 kg)    General: Appears his stated age, well developed, well nourished in NAD. Neck:  Neck supple, trachea midline. No masses, lumps or thyromegaly present.  Cardiovascular: Normal rate with irregular rhythm. Murmur noted. 1+ BLE edema, R>L. Pulmonary/Chest: Normal effort and positive vesicular breath sounds. No respiratory distress. No wheezes, rales or ronchi noted.  Abdomen: Soft and nontender. Normal bowel sounds. No distention or masses noted.  Musculoskeletal:  No difficulty with gait.  Neurological: Alert and oriented. Seems slightly confused.   BMET    Component Value Date/Time   NA 141 07/12/2017 0339   K 3.9 07/12/2017 0339   CL 105 07/12/2017 0339   CO2 29 07/12/2017 0339   GLUCOSE 82 07/12/2017 0339   BUN 21 (H) 07/12/2017 0339   CREATININE 1.41 (H) 07/12/2017 0339   CREATININE 1.71 (H) 10/15/2015 1050   CALCIUM 9.0 07/12/2017 0339   GFRNONAA 44 (L) 07/12/2017 0339   GFRAA 50 (L) 07/12/2017 0339    Lipid Panel  No results found for: CHOL, TRIG, HDL, CHOLHDL, VLDL, LDLCALC  CBC    Component Value Date/Time   WBC 5.8 07/12/2017 0339   RBC 4.05 (L) 07/12/2017 0339   HGB 12.5 (L) 07/12/2017 0339   HCT 36.6 (L) 07/12/2017 0339   PLT 194 07/12/2017 0339   MCV 90.4 07/12/2017 0339   MCH 30.9 07/12/2017 0339   MCHC 34.2 07/12/2017 0339   RDW 13.6 07/12/2017 0339   LYMPHSABS 1.2 07/11/2017 1630   MONOABS 0.7 07/11/2017 1630   EOSABS 0.3  07/11/2017 1630   BASOSABS 0.1 07/11/2017 1630    Hgb A1C Lab Results  Component Value Date   HGBA1C 5.7 (H) 03/13/2013            Assessment & Plan:

## 2018-10-22 NOTE — Assessment & Plan Note (Signed)
Not on ACEI/ARB due to low blood pressures Monitor yearly creatinine, GFR

## 2018-10-24 ENCOUNTER — Ambulatory Visit: Payer: Self-pay

## 2018-10-24 DIAGNOSIS — I480 Paroxysmal atrial fibrillation: Secondary | ICD-10-CM

## 2018-10-24 DIAGNOSIS — Z7901 Long term (current) use of anticoagulants: Secondary | ICD-10-CM

## 2018-10-24 LAB — POCT INR: INR: 1.9 — AB (ref 2.0–3.0)

## 2018-10-24 NOTE — Patient Instructions (Signed)
INR  On 12/2: 1.9 from [facility] Nurse  Patient is to take 5mg  tomorrow (12/3) and then increase weekly dose to taking 2.5mg  daily EXCEPT for 5mg  on Sundays and Thursdays only.  Recheck in 3 weeks.  Notified Austin Miles, RN at [facility] who assists with managing his medications.

## 2018-11-17 LAB — POCT INR: INR: 2.8 (ref 2.0–3.0)

## 2018-11-28 ENCOUNTER — Ambulatory Visit: Payer: Self-pay

## 2018-11-28 DIAGNOSIS — I480 Paroxysmal atrial fibrillation: Secondary | ICD-10-CM

## 2018-11-28 DIAGNOSIS — Z7901 Long term (current) use of anticoagulants: Secondary | ICD-10-CM

## 2018-11-28 NOTE — Patient Instructions (Signed)
INR  On 12/26: 2.8 from [facility] Nurse  Patient is to continue taking weekly dose of  2.5mg  daily EXCEPT for 5mg  on Sundays and Thursdays only.  Recheck in 4 weeks.  (instructions given by Dr. Silvio Pate in my absence).   Notified Austin Miles, RN at [facility] who assists with managing his medications.

## 2018-12-15 ENCOUNTER — Ambulatory Visit: Payer: Self-pay

## 2018-12-15 DIAGNOSIS — Z7901 Long term (current) use of anticoagulants: Secondary | ICD-10-CM

## 2018-12-15 DIAGNOSIS — I480 Paroxysmal atrial fibrillation: Secondary | ICD-10-CM

## 2018-12-15 LAB — POCT INR: INR: 2.8 (ref 2.0–3.0)

## 2018-12-16 LAB — POCT INR: INR: 2.8 (ref 2.0–3.0)

## 2018-12-16 NOTE — Patient Instructions (Signed)
INR  On 1/23: 2.8 from [facility] Nurse  Patient is to continue taking weekly dose of  2.5mg  daily EXCEPT for 5mg  on Sundays and Thursdays only.  Recheck in 4 weeks.  (instructions given to Austin Miles, [facility] Nurse).   Notified Austin Miles, RN at [facility] who assists with managing his medications.

## 2018-12-20 ENCOUNTER — Ambulatory Visit: Payer: Medicare Other | Admitting: Family

## 2018-12-20 ENCOUNTER — Other Ambulatory Visit (HOSPITAL_COMMUNITY): Payer: Medicare Other

## 2018-12-22 ENCOUNTER — Other Ambulatory Visit: Payer: Self-pay

## 2018-12-22 DIAGNOSIS — I872 Venous insufficiency (chronic) (peripheral): Secondary | ICD-10-CM

## 2019-01-06 NOTE — Progress Notes (Deleted)
HISTORY AND PHYSICAL     CC:  Follow up Requesting Provider:  Venia Carbon, MD  HPI: This is a 85 y.o. male who is a pt of Dr. Donnetta Hutching and followed for small AAA.  He was last seen by NP on 02/18/18 and at that time, pt remained asymptomatic.  His AAA measured 3.6cm (previous 3.5cm).  Right popliteal artery was ectatic and 1.03cm and left was 0.87cm.    The pt is here for follow up.  ***  The pt *** on a statin for cholesterol management.  The pt *** diabetic.  *** The pt *** on *** for hypertension.   Tobacco hx:  *** The pt *** on a daily aspirin. Other AC:  Coumadin for afib   Past Medical History:  Diagnosis Date  . AAA (abdominal aortic aneurysm) without rupture ([place]) 03/03/2013   Small, partially thrombosed saccular aneurysm  . Arthritis    HANDS  . Atrial fibrillation ([place]) 02/10/2013  . Cancer ([place])    SKIN CANCERS  . Chronic diastolic congestive heart failure ([place]) 03/20/2013  . Chronic kidney disease, stage III (moderate) ([facility])   . Constipation   . Erectile dysfunction   . Hearing loss    BILATERAL - WEARS HEARING AIDS  . History of torn meniscus of left knee    PAINFUL LEFT KNEE  . Mitral valve prolapse   . Mitral valve regurgitation   . OSA on CPAP   . S/P Maze operation for atrial fibrillation 03/15/2013   Complete bilateral lesion set using cryothermy and bipolar radiofrequency ablation with oversewing of LA appendage via right mini thoracotomy  . Spinal stenosis, thoracic 03/03/2013   T5-6    Past Surgical History:  Procedure Laterality Date  . CARPAL TUNNEL RELEASE     BILATERAL  . [place]  . INTRAOPERATIVE TRANSESOPHAGEAL ECHOCARDIOGRAM Right 03/15/2013   Procedure: INTRAOPERATIVE TRANSESOPHAGEAL ECHOCARDIOGRAM;  Surgeon: Rexene Alberts, MD;  Location: [facility];  Service: Open Heart Surgery;  Laterality: Right;  . MINIMALLY INVASIVE MAZE PROCEDURE Right 03/15/2013   Procedure: MINIMALLY INVASIVE MAZE PROCEDURE;  Surgeon: Rexene Alberts, MD;  Location: [facility];  Service: Open Heart Surgery;  Laterality: Right;  (R) AXILLARY CANNULATION  . MITRAL VALVE REPAIR Right 03/15/2013   Procedure: MINIMALLY INVASIVE MITRAL VALVE REPAIR (MVR);  Surgeon: Rexene Alberts, MD;  Location: [facility];  Service: Open Heart Surgery;  Laterality: Right;  . RIGHT ROTATOR CUFF REPAIR    . SKIN CANCERS REMOVED FROM BOTH LEGS    . SKIN CANCERS REMOVED FROM LEFT FOREHEAD AND LEFT NOSE    . SURGERY LEFT NECK FOR REMOVAL OF METASTATIC LYMPH NODE ( FROM CANCER LEFT FOREHEAD)  AND LEFT NECK DISSECTION-REST OF LYMPH NODES NEGATIVE FOR ANY CANCER    . TEE WITHOUT CARDIOVERSION N/A 01/31/2013   Procedure: TRANSESOPHAGEAL ECHOCARDIOGRAM (TEE);  Surgeon: Sueanne Margarita, MD;  Location: [facility] ENDOSCOPY;  Service: Cardiovascular;  Laterality: N/A;    Allergies  Allergen Reactions  . Statins     Muscle pains  . Latex Rash    bandaids--no other latex problems    Current Outpatient Medications  Medication Sig Dispense Refill  . acetaminophen (TYLENOL) 650 MG CR tablet Take 650 mg by mouth 3 (three) times daily.    Marland Kitchen allopurinol (ZYLOPRIM) 100 MG tablet Take 100 mg by mouth daily.    . cetirizine (ZYRTEC) 10 MG tablet Take 10 mg by mouth daily.     . cholecalciferol (  VITAMIN D) 1000 UNITS tablet Take 1,000 Units by mouth every morning. Reported on 03/19/2016    . cyanocobalamin (,VITAMIN B-12,) 1000 MCG/ML injection Inject 1,000 mcg into the muscle every 14 (fourteen) days.     . furosemide (LASIX) 40 MG tablet Take 40 mg by mouth daily.    . polyethylene glycol (MIRALAX / GLYCOLAX) packet Take 17 g by mouth daily.    . potassium chloride (K-DUR) 10 MEQ tablet Take 10 mEq by mouth daily.    Marland Kitchen senna-docusate (SENOKOT-S) 8.6-50 MG tablet Take 2 tablets by mouth daily. 60 tablet 11  . sodium chloride (OCEAN) 0.65 % SOLN nasal spray Place 1 spray into both nostrils as needed for congestion.    Marland Kitchen warfarin (COUMADIN) 5 MG tablet TAKE AS DIRECTED BY COUMADIN CLINIC 30  tablet 3   No current facility-administered medications for this visit.     Family History  Problem Relation Age of Onset  . Coronary artery disease Father   . Heart disease Father   . Heart attack Father   . Diabetes Mother   . Arthritis Mother   . Congestive Heart Failure Mother   . Alzheimer's disease Mother   . Alzheimer's disease Sister     Social History   Socioeconomic History  . Marital status: Widowed    Spouse name: Not on file  . Number of children: 2  . Years of education: Not on file  . Highest education level: Not on file  Occupational History  . Occupation: retired Art gallery manager  Social Needs  . Financial resource strain: Not on file  . Food insecurity:    Worry: Not on file    Inability: Not on file  . Transportation needs:    Medical: Not on file    Non-medical: Not on file  Tobacco Use  . Smoking status: Former Smoker    Packs/day: 1.00    Years: 20.00    Pack years: 20.00    Start date: 11/23/1960  . Smokeless tobacco: Never Used  . Tobacco comment: QUIT SMOKING 1962  Substance and Sexual Activity  . Alcohol use: Yes    Alcohol/week: 7.0 standard drinks    Types: 7 Standard drinks or equivalent per week    Comment: Cocktail or glass of wine with dinner  . Drug use: No  . Sexual activity: Not on file  Lifestyle  . Physical activity:    Days per week: Not on file    Minutes per session: Not on file  . Stress: Not on file  Relationships  . Social connections:    Talks on phone: Not on file    Gets together: Not on file    Attends religious service: Not on file    Active member of club or organization: Not on file    Attends meetings of clubs or organizations: Not on file    Relationship status: Not on file  . Intimate partner violence:    Fear of current or ex partner: Not on file    Emotionally abused: Not on file    Physically abused: Not on file    Forced sexual activity: Not on file  Other Topics Concern  . Not on file    Social History Narrative   Has living will   Daughter [name] is health care POA   Would accept resuscitation attempts but no prolonged ventilation   Not sure about tube feeds     REVIEW OF SYSTEMS:  *** [X]  denotes positive finding, [ ]   denotes negative finding Cardiac  Comments:  Chest pain or chest pressure:    Shortness of breath upon exertion:    Short of breath when lying flat:    Irregular heart rhythm:        Vascular    Pain in calf, thigh, or hip brought on by ambulation:    Pain in feet at night that wakes you up from your sleep:     Blood clot in your veins:    Leg swelling:         Pulmonary    Oxygen at home:    Productive cough:     Wheezing:         Neurologic    Sudden weakness in arms or legs:     Sudden numbness in arms or legs:     Sudden onset of difficulty speaking or slurred speech:    Temporary loss of vision in one eye:     Problems with dizziness:         Gastrointestinal    Blood in stool:     Vomited blood:         Genitourinary    Burning when urinating:     Blood in urine:        Psychiatric    Major depression:         Hematologic    Bleeding problems:    Problems with blood clotting too easily:        Skin    Rashes or ulcers:        Constitutional    Fever or chills:      PHYSICAL EXAMINATION:  ***  General:  WDWN in NAD; vital signs documented above Gait: Not observed HENT: WNL, normocephalic Pulmonary: normal non-labored breathing , without Rales, rhonchi,  wheezing Cardiac: {Desc; regular/irreg:14544} HR, without  Murmurs {With/Without:20273} carotid bruit*** Abdomen: soft, NT, no masses Skin: {With/Without:20273} rashes Vascular Exam/Pulses:  Right Left  Radial {Exam; arterial pulse strength 0-4:30167} {Exam; arterial pulse strength 0-4:30167}  Ulnar {Exam; arterial pulse strength 0-4:30167} {Exam; arterial pulse strength 0-4:30167}  Femoral {Exam; arterial pulse strength 0-4:30167} {Exam; arterial pulse  strength 0-4:30167}  Popliteal {Exam; arterial pulse strength 0-4:30167} {Exam; arterial pulse strength 0-4:30167}  DP {Exam; arterial pulse strength 0-4:30167} {Exam; arterial pulse strength 0-4:30167}  PT {Exam; arterial pulse strength 0-4:30167} {Exam; arterial pulse strength 0-4:30167}   Extremities: {With/Without:20273} ischemic changes, {With/Without:20273} Gangrene , {With/Without:20273} cellulitis; {With/Without:20273} open wounds;  Musculoskeletal: no muscle wasting or atrophy  Neurologic: A&O X 3;  No focal weakness or paresthesias are detected Psychiatric:  The pt has {Desc; normal/abnormal:11317::"Normal"} affect.   Non-Invasive Vascular Imaging:   ***    ASSESSMENT/PLAN:: 85 y.o. male here for follow up for ***   -***   Leontine Locket, PA-C [facility] [phone]  Clinic MD:   ***

## 2019-01-09 ENCOUNTER — Ambulatory Visit (INDEPENDENT_AMBULATORY_CARE_PROVIDER_SITE_OTHER): Payer: Medicare Other | Admitting: Physician Assistant

## 2019-01-09 ENCOUNTER — Other Ambulatory Visit: Payer: Self-pay

## 2019-01-09 ENCOUNTER — Ambulatory Visit: Payer: Medicare Other | Admitting: Family

## 2019-01-09 ENCOUNTER — Ambulatory Visit (HOSPITAL_COMMUNITY): Payer: Medicare Other

## 2019-01-09 ENCOUNTER — Encounter: Payer: Self-pay | Admitting: Family

## 2019-01-09 ENCOUNTER — Ambulatory Visit (HOSPITAL_COMMUNITY)
Admission: RE | Admit: 2019-01-09 | Discharge: 2019-01-09 | Disposition: A | Discharge status: Home / Self Care | Payer: Medicare Other | Source: Ambulatory Visit | Attending: Vascular Surgery | Admitting: Vascular Surgery

## 2019-01-09 VITALS — BP 158/77 | HR 82 | Temp 97.0°F | Resp 14 | Ht 70.0 in | Wt 177.5 lb

## 2019-01-09 DIAGNOSIS — I872 Venous insufficiency (chronic) (peripheral): Secondary | ICD-10-CM | POA: Insufficient documentation

## 2019-01-09 NOTE — Progress Notes (Signed)
HISTORY AND PHYSICAL     CC:  Follow up. Requesting Provider:  Venia Carbon, MD  HPI: This is a 85 y.o. male who is followed for AAA.  It has been slow growing and last measured 3.6cm.  He does not have any abdominal pain or back pain.  He does complain of left hip pain that has been going on for a couple of days.    The pt is here for leg swelling accompanied by his daughter.  She states that Dr. Laurann Montana felt that since his heart was good, he may have venous insufficiency that is causing his leg swelling.  He states this has been going on for a while.  His legs feel achy and are both equal.  He states he does have some compression socks, but doesn't wear them often.  He is up on his feet a lot of the day and does not elevate his legs.  He has a hx of left knee surgery.  He does not have hx of abdominal surgery or prostate surgery.    The pt is not on a statin for cholesterol management.  The pt is not diabetic.   The pt is not on medication for hypertension.   Tobacco hx:  Remote-quit '62 The pt is not on a daily aspirin. Other AC:  coumadin   Past Medical History:  Diagnosis Date  . AAA (abdominal aortic aneurysm) without rupture ([place]) 03/03/2013   Small, partially thrombosed saccular aneurysm  . Arthritis    HANDS  . Atrial fibrillation ([place]) 02/10/2013  . Cancer ([place])    SKIN CANCERS  . Chronic diastolic congestive heart failure ([place]) 03/20/2013  . Chronic kidney disease, stage III (moderate) (HCC)   . Constipation   . Erectile dysfunction   . Hearing loss    BILATERAL - WEARS HEARING AIDS  . History of torn meniscus of left knee    PAINFUL LEFT KNEE  . Mitral valve prolapse   . Mitral valve regurgitation   . OSA on CPAP   . S/P Maze operation for atrial fibrillation 03/15/2013   Complete bilateral lesion set using cryothermy and bipolar radiofrequency ablation with oversewing of LA appendage via right mini thoracotomy  . Spinal stenosis, thoracic 03/03/2013   T5-6     Past Surgical History:  Procedure Laterality Date  . CARPAL TUNNEL RELEASE     BILATERAL  . [place]  . INTRAOPERATIVE TRANSESOPHAGEAL ECHOCARDIOGRAM Right 03/15/2013   Procedure: INTRAOPERATIVE TRANSESOPHAGEAL ECHOCARDIOGRAM;  Surgeon: Rexene Alberts, MD;  Location: [facility];  Service: Open Heart Surgery;  Laterality: Right;  . MINIMALLY INVASIVE MAZE PROCEDURE Right 03/15/2013   Procedure: MINIMALLY INVASIVE MAZE PROCEDURE;  Surgeon: Rexene Alberts, MD;  Location: [facility];  Service: Open Heart Surgery;  Laterality: Right;  (R) AXILLARY CANNULATION  . MITRAL VALVE REPAIR Right 03/15/2013   Procedure: MINIMALLY INVASIVE MITRAL VALVE REPAIR (MVR);  Surgeon: Rexene Alberts, MD;  Location: [facility];  Service: Open Heart Surgery;  Laterality: Right;  . RIGHT ROTATOR CUFF REPAIR    . SKIN CANCERS REMOVED FROM BOTH LEGS    . SKIN CANCERS REMOVED FROM LEFT FOREHEAD AND LEFT NOSE    . SURGERY LEFT NECK FOR REMOVAL OF METASTATIC LYMPH NODE ( FROM CANCER LEFT FOREHEAD)  AND LEFT NECK DISSECTION-REST OF LYMPH NODES NEGATIVE FOR ANY CANCER    . TEE WITHOUT CARDIOVERSION N/A 01/31/2013   Procedure: TRANSESOPHAGEAL ECHOCARDIOGRAM (TEE);  Surgeon: Sueanne Margarita, MD;  Location: [facility] ENDOSCOPY;  Service: Cardiovascular;  Laterality: N/A;    Allergies  Allergen Reactions  . Statins     Muscle pains  . Latex Rash    bandaids--no other latex problems    Current Outpatient Medications  Medication Sig Dispense Refill  . acetaminophen (TYLENOL) 650 MG CR tablet Take 650 mg by mouth 3 (three) times daily.    Marland Kitchen allopurinol (ZYLOPRIM) 100 MG tablet Take 100 mg by mouth daily.    . cetirizine (ZYRTEC) 10 MG tablet Take 10 mg by mouth daily.     . cholecalciferol (VITAMIN D) 1000 UNITS tablet Take 1,000 Units by mouth every morning. Reported on 03/19/2016    . cyanocobalamin (,VITAMIN B-12,) 1000 MCG/ML injection Inject 1,000 mcg into the muscle every 14 (fourteen) days.     . furosemide (LASIX)  40 MG tablet Take 40 mg by mouth daily.    . polyethylene glycol (MIRALAX / GLYCOLAX) packet Take 17 g by mouth daily.    . potassium chloride (K-DUR) 10 MEQ tablet Take 10 mEq by mouth daily.    Marland Kitchen senna-docusate (SENOKOT-S) 8.6-50 MG tablet Take 2 tablets by mouth daily. 60 tablet 11  . sodium chloride (OCEAN) 0.65 % SOLN nasal spray Place 1 spray into both nostrils as needed for congestion.    Marland Kitchen warfarin (COUMADIN) 5 MG tablet TAKE AS DIRECTED BY COUMADIN CLINIC 30 tablet 3   No current facility-administered medications for this visit.     Family History  Problem Relation Age of Onset  . Coronary artery disease Father   . Heart disease Father   . Heart attack Father   . Diabetes Mother   . Arthritis Mother   . Congestive Heart Failure Mother   . Alzheimer's disease Mother   . Alzheimer's disease Sister     Social History   Socioeconomic History  . Marital status: Widowed    Spouse name: Not on file  . Number of children: 2  . Years of education: Not on file  . Highest education level: Not on file  Occupational History  . Occupation: retired Art gallery manager  Social Needs  . Financial resource strain: Not on file  . Food insecurity:    Worry: Not on file    Inability: Not on file  . Transportation needs:    Medical: Not on file    Non-medical: Not on file  Tobacco Use  . Smoking status: Former Smoker    Packs/day: 1.00    Years: 20.00    Pack years: 20.00    Start date: 11/23/1960  . Smokeless tobacco: Never Used  . Tobacco comment: QUIT SMOKING 1962  Substance and Sexual Activity  . Alcohol use: Yes    Alcohol/week: 7.0 standard drinks    Types: 7 Standard drinks or equivalent per week    Comment: Cocktail or glass of wine with dinner  . Drug use: No  . Sexual activity: Not on file  Lifestyle  . Physical activity:    Days per week: Not on file    Minutes per session: Not on file  . Stress: Not on file  Relationships  . Social connections:    Talks on  phone: Not on file    Gets together: Not on file    Attends religious service: Not on file    Active member of club or organization: Not on file    Attends meetings of clubs or organizations: Not on file    Relationship status: Not on file  .  Intimate partner violence:    Fear of current or ex partner: Not on file    Emotionally abused: Not on file    Physically abused: Not on file    Forced sexual activity: Not on file  Other Topics Concern  . Not on file  Social History Narrative   Has living will   Daughter [name] is health care POA   Would accept resuscitation attempts but no prolonged ventilation   Not sure about tube feeds     REVIEW OF SYSTEMS:   [X]  denotes positive finding, [ ]  denotes negative finding Cardiac  Comments:  Chest pain or chest pressure:    Shortness of breath upon exertion:    Short of breath when lying flat:    Irregular heart rhythm:        Vascular    Pain in calf, thigh, or hip brought on by ambulation: x hip  Pain in feet at night that wakes you up from your sleep:     Blood clot in your veins:    Leg swelling:  x       Pulmonary    Oxygen at home:    Productive cough:     Wheezing:         Neurologic    Sudden weakness in arms or legs:     Sudden numbness in arms or legs:     Sudden onset of difficulty speaking or slurred speech:    Temporary loss of vision in one eye:     Problems with dizziness:         Gastrointestinal    Blood in stool:     Vomited blood:         Genitourinary    Burning when urinating:     Blood in urine:        Psychiatric    Major depression:         Hematologic    Bleeding problems:    Problems with blood clotting too easily:        Skin    Rashes or ulcers:        Constitutional    Fever or chills:      PHYSICAL EXAMINATION:  Today's Vitals   01/09/19 1358  BP: (!) 158/77  Pulse: 82  Resp: 14  Temp: (!) 97 F (36.1 C)  TempSrc: Oral  SpO2: 98%  Weight: 177 lb 7.5 oz (80.5 kg)    Height: 5\' 10"  (1.778 m)   Body mass index is 25.46 kg/m.   General:  WDWN in NAD; vital signs documented above Gait: Not observed HENT: WNL, normocephalic Pulmonary: normal non-labored breathing , without Rales, rhonchi,  wheezing Cardiac: regular HR, without  Murmurs without carotid bruits Abdomen: soft, NT, no masses; aorta is palpable Skin: without rashes Vascular Exam/Pulses:  Right Left  Radial 2+ (normal) 2+ (normal)  Ulnar Unable to palpate  1+ (weak)  Femoral 2+ (normal) 2+ (normal)  Popliteal Unable to palpate  Unable to palpate   DP 2+ (normal) Unable to palpate or doppler   PT + brisk doppler signal + brisk doppler signal   Extremities: varicose veins present with hemosiderin staining.  2+ swelling BLE    Musculoskeletal: no muscle wasting or atrophy  Neurologic: A&O X 3;  No focal weakness or paresthesias are detected Psychiatric:  The pt has Normal affect.   Non-Invasive Vascular Imaging:   Venous duplex 01/09/2019:  Right (cm)Left (cm) +------------------------------+----------+---------+ GSV at Saphenofemoral junction0.523     0.635     +------------------------------+----------+---------+ GSV at prox thigh             0.438     0.547     +------------------------------+----------+---------+ GSV at mid thigh              0.408     0.433     +------------------------------+----------+---------+ GSV at distal thigh           0.563     0.438     +------------------------------+----------+---------+ GSV at knee                   0.520     0.433     +------------------------------+----------+---------+ GSV prox calf                 0.447     0.501     +------------------------------+----------+---------+ GSV mid calf                  0.450     0.606     +------------------------------+----------+---------+ SSV origin                    0.142     0.204      +------------------------------+----------+---------+ SSV prox                      0.208     0.197     +------------------------------+----------+---------+ SSV mid                                 0.202     +------------------------------+----------+---------+    Right Reflux Technical Findings: No evidence of DVT or Baker's cyst. The small saphenous vein is non compressible consistent with chronic thrombus. The great saphenous vein is small and non compressible from the mid to distal thigh with a large collateral consistent with chronic thrombus. The sapheno-femoral junction is incompetent. The GSV demonstrates reflux from the SFJ to the calf. No evidence of SSV reflux. No evidence of deep venous reflux.   Left Reflux Technical Findings: No evidence of DVT or SVT. Significant Baker's cyst seen in left popliteal fossa. The sapheno-femoral junction is incompetent. The GSV demonstrates reflux from the SFJ to the calf. No evidence of SSV reflux. No evidence of deep venous reflux.     ASSESSMENT/PLAN:: 85 y.o. 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  Advised pt to wear thigh high compression of 20-38mmHg daily putting them on in the morning and taking off in the evening.  Also discussed leg elevation and showed he and his daughter the proper way to elevate several times a day if possible to help with swelling.  He was measured here and his daughter will go to [employer] to get stockings.   -he does have hx of AAA measuring 3.6cm in March 2019.  His aorta was palpable today.  Will have him come back in the next month and have a AAA duplex and follow up with the midlevel provider.  If still stable, can change to every 18-24 months evaluation.  He and his daughter are in agreement with this plan.   -left hip pain for a couple of days-he does have an easily palpable left femoral pulse and do not feel this is due to arterial  insufficiency.    -  pt is on coumadin for PAF.   Leontine Locket, PA-C [facility] [phone]  Clinic MD:   Trula Slade

## 2019-01-12 ENCOUNTER — Encounter: Payer: Self-pay | Admitting: Internal Medicine

## 2019-01-12 ENCOUNTER — Ambulatory Visit: Payer: Medicare Other | Admitting: Internal Medicine

## 2019-01-12 VITALS — BP 120/68 | HR 68 | Temp 97.8°F | Resp 18 | Wt 177.4 lb

## 2019-01-12 DIAGNOSIS — Z9989 Dependence on other enabling machines and devices: Secondary | ICD-10-CM

## 2019-01-12 DIAGNOSIS — D631 Anemia in chronic kidney disease: Secondary | ICD-10-CM

## 2019-01-12 DIAGNOSIS — N183 Chronic kidney disease, stage 3 unspecified: Secondary | ICD-10-CM

## 2019-01-12 DIAGNOSIS — I480 Paroxysmal atrial fibrillation: Secondary | ICD-10-CM | POA: Diagnosis not present

## 2019-01-12 DIAGNOSIS — I5032 Chronic diastolic (congestive) heart failure: Secondary | ICD-10-CM | POA: Diagnosis not present

## 2019-01-12 DIAGNOSIS — I714 Abdominal aortic aneurysm, without rupture, unspecified: Secondary | ICD-10-CM

## 2019-01-12 DIAGNOSIS — I82811 Embolism and thrombosis of superficial veins of right lower extremities: Secondary | ICD-10-CM | POA: Insufficient documentation

## 2019-01-12 DIAGNOSIS — M1A3420 Chronic gout due to renal impairment, left hand, without tophus (tophi): Secondary | ICD-10-CM

## 2019-01-12 DIAGNOSIS — G4733 Obstructive sleep apnea (adult) (pediatric): Secondary | ICD-10-CM

## 2019-01-12 LAB — PROTIME-INR

## 2019-01-12 NOTE — Progress Notes (Signed)
Subjective:    Patient ID: [name], male    DOB: 12-Apr-1929, 85 y.o.   MRN: [id number]  HPI Visit in [place] [facility] for review of chronic health conditions Reviewed status with Luellen Pucker RN Recent vascular visit---chronic venous disease and right vein thrombosis Will be due for recheck of his AAA soon  Last labs show GFR down slightly--32 Hgb 12.2  Had skin cancer removed off right leg last week Healing okay  No chest pain No palpitations No SOB Sleeps flat in bed---no PND Mild persistent edema---will wear support hose at times  No gout flares  Arthritis not bad Tylenol arthritis tid seems to be helping  Current Outpatient Medications on File Prior to Visit  Medication Sig Dispense Refill  . acetaminophen (TYLENOL) 650 MG CR tablet Take 650 mg by mouth 3 (three) times daily.    Marland Kitchen allopurinol (ZYLOPRIM) 100 MG tablet Take 100 mg by mouth daily.    . cetirizine (ZYRTEC) 10 MG tablet Take 10 mg by mouth daily.     . cholecalciferol (VITAMIN D) 1000 UNITS tablet Take 1,000 Units by mouth every morning. Reported on 03/19/2016    . cyanocobalamin (,VITAMIN B-12,) 1000 MCG/ML injection Inject 1,000 mcg into the muscle every 14 (fourteen) days.     . furosemide (LASIX) 40 MG tablet Take 40 mg by mouth daily.    . polyethylene glycol (MIRALAX / GLYCOLAX) packet Take 17 g by mouth daily.    . potassium chloride (K-DUR) 10 MEQ tablet Take 10 mEq by mouth daily.    Marland Kitchen senna-docusate (SENOKOT-S) 8.6-50 MG tablet Take 2 tablets by mouth daily. 60 tablet 11  . sodium chloride (OCEAN) 0.65 % SOLN nasal spray Place 1 spray into both nostrils as needed for congestion.    Marland Kitchen warfarin (COUMADIN) 5 MG tablet TAKE AS DIRECTED BY COUMADIN CLINIC 30 tablet 3   No current facility-administered medications on file prior to visit.     Allergies  Allergen Reactions  . Statins     Muscle pains  . Latex Rash    bandaids--no other latex problems    Past Medical History:  Diagnosis Date  .  AAA (abdominal aortic aneurysm) without rupture ([place]) 03/03/2013   Small, partially thrombosed saccular aneurysm  . Arthritis    HANDS  . Atrial fibrillation ([place]) 02/10/2013  . Cancer ([place])    SKIN CANCERS  . Chronic diastolic congestive heart failure ([place]) 03/20/2013  . Chronic kidney disease, stage III (moderate) ([facility])   . Constipation   . Erectile dysfunction   . Hearing loss    BILATERAL - WEARS HEARING AIDS  . History of torn meniscus of left knee    PAINFUL LEFT KNEE  . Mitral valve prolapse   . Mitral valve regurgitation   . OSA on CPAP   . S/P Maze operation for atrial fibrillation 03/15/2013   Complete bilateral lesion set using cryothermy and bipolar radiofrequency ablation with oversewing of LA appendage via right mini thoracotomy  . Spinal stenosis, thoracic 03/03/2013   T5-6    Past Surgical History:  Procedure Laterality Date  . CARPAL TUNNEL RELEASE     BILATERAL  . [place]  . INTRAOPERATIVE TRANSESOPHAGEAL ECHOCARDIOGRAM Right 03/15/2013   Procedure: INTRAOPERATIVE TRANSESOPHAGEAL ECHOCARDIOGRAM;  Surgeon: Rexene Alberts, MD;  Location: [facility];  Service: Open Heart Surgery;  Laterality: Right;  . MINIMALLY INVASIVE MAZE PROCEDURE Right 03/15/2013   Procedure: MINIMALLY INVASIVE MAZE PROCEDURE;  Surgeon: Rexene Alberts,  MD;  Location: [facility];  Service: Open Heart Surgery;  Laterality: Right;  (R) AXILLARY CANNULATION  . MITRAL VALVE REPAIR Right 03/15/2013   Procedure: MINIMALLY INVASIVE MITRAL VALVE REPAIR (MVR);  Surgeon: Rexene Alberts, MD;  Location: [facility] [place];  Service: Open Heart Surgery;  Laterality: Right;  . RIGHT ROTATOR CUFF REPAIR    . SKIN CANCERS REMOVED FROM BOTH LEGS    . SKIN CANCERS REMOVED FROM LEFT FOREHEAD AND LEFT NOSE    . SURGERY LEFT NECK FOR REMOVAL OF METASTATIC LYMPH NODE ( FROM CANCER LEFT FOREHEAD)  AND LEFT NECK DISSECTION-REST OF LYMPH NODES NEGATIVE FOR ANY CANCER    . TEE WITHOUT CARDIOVERSION N/A 01/31/2013    Procedure: TRANSESOPHAGEAL ECHOCARDIOGRAM (TEE);  Surgeon: Sueanne Margarita, MD;  Location: [facility] ENDOSCOPY;  Service: Cardiovascular;  Laterality: N/A;    Family History  Problem Relation Age of Onset  . Coronary artery disease Father   . Heart disease Father   . Heart attack Father   . Diabetes Mother   . Arthritis Mother   . Congestive Heart Failure Mother   . Alzheimer's disease Mother   . Alzheimer's disease Sister     Social History   Socioeconomic History  . Marital status: Widowed    Spouse name: Not on file  . Number of children: 2  . Years of education: Not on file  . Highest education level: Not on file  Occupational History  . Occupation: retired Art gallery manager  Social Needs  . Financial resource strain: Not on file  . Food insecurity:    Worry: Not on file    Inability: Not on file  . Transportation needs:    Medical: Not on file    Non-medical: Not on file  Tobacco Use  . Smoking status: Former Smoker    Packs/day: 1.00    Years: 20.00    Pack years: 20.00    Start date: 11/23/1960  . Smokeless tobacco: Never Used  . Tobacco comment: QUIT SMOKING 1962  Substance and Sexual Activity  . Alcohol use: Yes    Alcohol/week: 7.0 standard drinks    Types: 7 Standard drinks or equivalent per week    Comment: Cocktail or glass of wine with dinner  . Drug use: No  . Sexual activity: Not on file  Lifestyle  . Physical activity:    Days per week: Not on file    Minutes per session: Not on file  . Stress: Not on file  Relationships  . Social connections:    Talks on phone: Not on file    Gets together: Not on file    Attends religious service: Not on file    Active member of club or organization: Not on file    Attends meetings of clubs or organizations: Not on file    Relationship status: Not on file  . Intimate partner violence:    Fear of current or ex partner: Not on file    Emotionally abused: Not on file    Physically abused: Not on file    Forced  sexual activity: Not on file  Other Topics Concern  . Not on file  Social History Narrative   Has living will   Daughter [name] is health care POA   Would accept resuscitation attempts but no prolonged ventilation   Not sure about tube feeds   Review of Systems Appetite is fine Weight is stable Sleeps well Some constipation--- takes /miralaxwith success usually    Objective:  Physical Exam  Constitutional: No distress.  Neck: No thyromegaly present.  Cardiovascular: Normal rate and regular rhythm. Exam reveals no gallop.  Regular with occ skips Soft systolic murmur  Respiratory: Effort normal and breath sounds normal. No respiratory distress. He has no wheezes.  GI: Soft. There is no abdominal tenderness.  Musculoskeletal:     Comments: Calves are full but no pitting  Psychiatric: He has a normal mood and affect. His behavior is normal.           Assessment & Plan:

## 2019-01-12 NOTE — Assessment & Plan Note (Signed)
Chronic On warfarin already

## 2019-01-12 NOTE — Assessment & Plan Note (Signed)
Quiet on the allopurinol 

## 2019-01-12 NOTE — Assessment & Plan Note (Signed)
Due for recheck soon

## 2019-01-12 NOTE — Assessment & Plan Note (Signed)
GFR down slightly Holding off on ACEI/ARB due to concerns about hypoperfusion

## 2019-01-12 NOTE — Assessment & Plan Note (Signed)
No symptoms of paroxysms On the warfarin

## 2019-01-12 NOTE — Assessment & Plan Note (Signed)
Compensated on furosemide No dyspnea

## 2019-01-12 NOTE — Assessment & Plan Note (Signed)
Last hemoglobin 12.2 Doesn't appear to be symptomatic

## 2019-01-12 NOTE — Assessment & Plan Note (Signed)
Uses the CPAP almost all the time

## 2019-01-13 ENCOUNTER — Ambulatory Visit (INDEPENDENT_AMBULATORY_CARE_PROVIDER_SITE_OTHER): Payer: Medicare Other

## 2019-01-13 DIAGNOSIS — Z7901 Long term (current) use of anticoagulants: Secondary | ICD-10-CM | POA: Diagnosis not present

## 2019-01-13 DIAGNOSIS — I480 Paroxysmal atrial fibrillation: Secondary | ICD-10-CM

## 2019-01-13 LAB — POCT INR: INR: 3.6 — AB (ref 2.0–3.0)

## 2019-01-13 NOTE — Patient Instructions (Signed)
INR  On 2/21: 3.6 from [facility] Nurse  Patient is to hold coumadin today (2/21) and then decrease weekly dose to take 1/2 pill (2.5mg )  daily EXCEPT for 1 (5mg ) pill on Sundays, recheck in 2 weeks. On 01/26/19.  Nurse, Luellen Pucker, denies any changes to patients diet, health or medications.    Notified Austin Miles, RN at [facility] who assists with managing his medications.

## 2019-01-18 ENCOUNTER — Encounter: Payer: Self-pay | Admitting: Internal Medicine

## 2019-01-26 ENCOUNTER — Ambulatory Visit: Payer: Self-pay

## 2019-01-26 DIAGNOSIS — Z7901 Long term (current) use of anticoagulants: Secondary | ICD-10-CM

## 2019-01-26 DIAGNOSIS — I480 Paroxysmal atrial fibrillation: Secondary | ICD-10-CM

## 2019-01-26 LAB — POCT INR: INR: 2.7 (ref 2.0–3.0)

## 2019-01-27 LAB — POCT INR: INR: 2.7 (ref 2.0–3.0)

## 2019-01-27 NOTE — Patient Instructions (Signed)
INR  On 3/5 2.7 from [place] with Luellen Pucker, RN at [facility] and gave the following orders.  Patient is to continue to take 1/2 pill (2.5mg )  daily EXCEPT for 1 (5mg ) pill on Sundays, recheck in 4 weeks.   Luellen Pucker informs me that after today patient will be changing PCP's to a Dr. Laurann Montana and no longer will be following our practice.  Luellen Pucker is aware that patient will need to have his INR's managed by Dr. Laurann Montana at this point.  Recheck due in 4 weeks.  She will communicate this with new PCP.   Nurse, Luellen Pucker, denies any changes to patients diet, health or medications.    Notified Austin Miles, RN at [facility] who assists with managing his medications.

## 2019-02-21 ENCOUNTER — Other Ambulatory Visit (HOSPITAL_COMMUNITY): Payer: Medicare Other

## 2019-02-21 ENCOUNTER — Ambulatory Visit: Payer: Medicare Other | Admitting: Family

## 2019-04-06 ENCOUNTER — Ambulatory Visit: Payer: Medicare Other | Admitting: Vascular Surgery

## 2019-06-01 ENCOUNTER — Ambulatory Visit: Payer: Self-pay | Admitting: General Practice

## 2019-06-02 ENCOUNTER — Telehealth: Payer: Self-pay | Admitting: Vascular Surgery

## 2019-06-02 NOTE — Telephone Encounter (Signed)
Could not leave message, no mailbox set up yet

## 2020-01-29 LAB — POCT INR: INR: 2.2 (ref 2.0–3.0)

## 2020-01-31 ENCOUNTER — Telehealth: Payer: Self-pay

## 2020-01-31 ENCOUNTER — Ambulatory Visit: Payer: Self-pay

## 2020-01-31 NOTE — Telephone Encounter (Signed)
Received fax from Austin Miles, RN, at [facility] with INR results from yesterday. Pt has changed PCP about 6-8 months ago and this office no longer manages his coumadin. Left VM explaining and also faxed back results with explanation.

## 2020-01-31 NOTE — Telephone Encounter (Signed)
Received call from [place] who reports pt is establishing care with Dr. Silvio Pate again. She reports she has talked to Dr. Silvio Pate and he said to send him the INR and dosing he is currently on and he will f/u. She reports pt will see Dr. Silvio Pate next week. Advised to send current dosing to this nurse also. Luellen Pucker verbalized understanding and will fax dosing.

## 2020-01-31 NOTE — Telephone Encounter (Signed)
Received fax with Dr. Silvio Pate order and dosing and result. Charted in anticoag encounter.

## 2020-01-31 NOTE — Patient Instructions (Addendum)
Pre visit review using our clinic review tool, if applicable. No additional management support is needed unless otherwise documented below in the visit note.  Per Dr. Silvio Pate pt is taking 2.5mg  daily except 5mg  on Sunday. Recheck in 1 month

## 2020-02-08 ENCOUNTER — Other Ambulatory Visit: Payer: Self-pay

## 2020-02-08 ENCOUNTER — Ambulatory Visit: Payer: Medicare Other | Admitting: Internal Medicine

## 2020-02-08 ENCOUNTER — Encounter: Payer: Self-pay | Admitting: Internal Medicine

## 2020-02-08 DIAGNOSIS — M4804 Spinal stenosis, thoracic region: Secondary | ICD-10-CM | POA: Diagnosis not present

## 2020-02-08 DIAGNOSIS — I5032 Chronic diastolic (congestive) heart failure: Secondary | ICD-10-CM | POA: Diagnosis not present

## 2020-02-08 DIAGNOSIS — F0150 Vascular dementia without behavioral disturbance: Secondary | ICD-10-CM | POA: Diagnosis not present

## 2020-02-08 DIAGNOSIS — I714 Abdominal aortic aneurysm, without rupture, unspecified: Secondary | ICD-10-CM

## 2020-02-08 DIAGNOSIS — N1831 Chronic kidney disease, stage 3a: Secondary | ICD-10-CM

## 2020-02-08 NOTE — Assessment & Plan Note (Signed)
Compensated. No changes needed. 

## 2020-02-08 NOTE — Assessment & Plan Note (Signed)
Will check with daughter --consider ultrasound if she agrees (but was only 3.6cm)

## 2020-02-08 NOTE — Assessment & Plan Note (Signed)
No apparent symptoms now

## 2020-02-08 NOTE — Progress Notes (Signed)
Subjective:    Patient ID: [name], male    DOB: Dec 11, 1928, 86 y.o.   MRN: [id number]  HPI Visit in his assisted living apartment to reestablish care--I haven't been seeing him for the past year Reviewed status with Luellen Pucker RN  He has a significant cognitive decline Getting confused about a lot of things Will go down for 3PM walk at 1PM Was in the lobby this morning at 4AM looking for breakfast Not able to figure out his CPAP--so not using it  No palpitations No SOB Sleeps in bed fairly flat--no PND No sig edema No dizziness or syncope  No abdominal pain Has not had recheck of AAA in 2 years (but was only 3.6cm)  No recent back pain No leg pain or weakness---still walks outside  Current Outpatient Medications on File Prior to Visit  Medication Sig Dispense Refill  . acetaminophen (TYLENOL) 650 MG CR tablet Take 650 mg by mouth 3 (three) times daily.    Marland Kitchen allopurinol (ZYLOPRIM) 100 MG tablet Take 100 mg by mouth daily.    . cetirizine (ZYRTEC) 10 MG tablet Take 10 mg by mouth daily.     . cholecalciferol (VITAMIN D) 1000 UNITS tablet Take 1,000 Units by mouth every morning. Reported on 03/19/2016    . cyanocobalamin (,VITAMIN B-12,) 1000 MCG/ML injection Inject 1,000 mcg into the muscle every 14 (fourteen) days.     . furosemide (LASIX) 40 MG tablet Take 40 mg by mouth daily.    . polyethylene glycol (MIRALAX / GLYCOLAX) packet Take 17 g by mouth daily.    . potassium chloride (K-DUR) 10 MEQ tablet Take 10 mEq by mouth daily.    Marland Kitchen senna-docusate (SENOKOT-S) 8.6-50 MG tablet Take 2 tablets by mouth daily. 60 tablet 11  . sodium chloride (OCEAN) 0.65 % SOLN nasal spray Place 1 spray into both nostrils as needed for congestion.    Marland Kitchen warfarin (COUMADIN) 5 MG tablet TAKE AS DIRECTED BY COUMADIN CLINIC 30 tablet 3   No current facility-administered medications on file prior to visit.    Allergies  Allergen Reactions  . Statins     Muscle pains  . Latex Rash   bandaids--no other latex problems    Past Medical History:  Diagnosis Date  . AAA (abdominal aortic aneurysm) without rupture ([place]) 03/03/2013   Small, partially thrombosed saccular aneurysm  . Arthritis    HANDS  . Atrial fibrillation ([place]) 02/10/2013  . Cancer ([place])    SKIN CANCERS  . Chronic diastolic congestive heart failure ([place]) 03/20/2013  . Chronic kidney disease, stage III (moderate)   . Constipation   . Erectile dysfunction   . Hearing loss    BILATERAL - WEARS HEARING AIDS  . History of torn meniscus of left knee    PAINFUL LEFT KNEE  . Mitral valve prolapse   . Mitral valve regurgitation   . OSA on CPAP   . S/P Maze operation for atrial fibrillation 03/15/2013   Complete bilateral lesion set using cryothermy and bipolar radiofrequency ablation with oversewing of LA appendage via right mini thoracotomy  . Spinal stenosis, thoracic 03/03/2013   T5-6    Past Surgical History:  Procedure Laterality Date  . CARPAL TUNNEL RELEASE     BILATERAL  . [place]  . INTRAOPERATIVE TRANSESOPHAGEAL ECHOCARDIOGRAM Right 03/15/2013   Procedure: INTRAOPERATIVE TRANSESOPHAGEAL ECHOCARDIOGRAM;  Surgeon: Rexene Alberts, MD;  Location: [place];  Service: Open Heart Surgery;  Laterality: Right;  .  MINIMALLY INVASIVE MAZE PROCEDURE Right 03/15/2013   Procedure: MINIMALLY INVASIVE MAZE PROCEDURE;  Surgeon: Rexene Alberts, MD;  Location: [place];  Service: Open Heart Surgery;  Laterality: Right;  (R) AXILLARY CANNULATION  . MITRAL VALVE REPAIR Right 03/15/2013   Procedure: MINIMALLY INVASIVE MITRAL VALVE REPAIR (MVR);  Surgeon: Rexene Alberts, MD;  Location: [place];  Service: Open Heart Surgery;  Laterality: Right;  . RIGHT ROTATOR CUFF REPAIR    . SKIN CANCERS REMOVED FROM BOTH LEGS    . SKIN CANCERS REMOVED FROM LEFT FOREHEAD AND LEFT NOSE    . SURGERY LEFT NECK FOR REMOVAL OF METASTATIC LYMPH NODE ( FROM CANCER LEFT FOREHEAD)  AND LEFT NECK DISSECTION-REST OF LYMPH NODES  NEGATIVE FOR ANY CANCER    . TEE WITHOUT CARDIOVERSION N/A 01/31/2013   Procedure: TRANSESOPHAGEAL ECHOCARDIOGRAM (TEE);  Surgeon: Sueanne Margarita, MD;  Location: [facility] ENDOSCOPY;  Service: Cardiovascular;  Laterality: N/A;    Family History  Problem Relation Age of Onset  . Coronary artery disease Father   . Heart disease Father   . Heart attack Father   . Diabetes Mother   . Arthritis Mother   . Congestive Heart Failure Mother   . Alzheimer's disease Mother   . Alzheimer's disease Sister     Social History   Socioeconomic History  . Marital status: Widowed    Spouse name: Not on file  . Number of children: 2  . Years of education: Not on file  . Highest education level: Not on file  Occupational History  . Occupation: retired Art gallery manager  Tobacco Use  . Smoking status: Former Smoker    Packs/day: 1.00    Years: 20.00    Pack years: 20.00    Start date: 11/23/1960  . Smokeless tobacco: Never Used  . Tobacco comment: QUIT SMOKING 1962  Substance and Sexual Activity  . Alcohol use: Yes    Alcohol/week: 7.0 standard drinks    Types: 7 Standard drinks or equivalent per week    Comment: Cocktail or glass of wine with dinner  . Drug use: No  . Sexual activity: Not on file  Other Topics Concern  . Not on file  Social History Narrative   Has living will   Daughter [name] is health care POA   Would accept resuscitation attempts but no prolonged ventilation   Not sure about tube feeds   Social Determinants of Health   Financial Resource Strain:   . Difficulty of Paying Living Expenses:   Food Insecurity:   . Worried About Charity fundraiser in the Last Year:   . Arboriculturist in the Last Year:   Transportation Needs:   . Film/video editor (Medical):   Marland Kitchen Lack of Transportation (Non-Medical):   Physical Activity:   . Days of Exercise per Week:   . Minutes of Exercise per Session:   Stress:   . Feeling of Stress :   Social Connections:   . Frequency of  Communication with Friends and Family:   . Frequency of Social Gatherings with Friends and Family:   . Attends Religious Services:   . Active Member of Clubs or Organizations:   . Attends Archivist Meetings:   Marland Kitchen Marital Status:   Intimate Partner Violence:   . Fear of Current or Ex-Partner:   . Emotionally Abused:   Marland Kitchen Physically Abused:   . Sexually Abused:    Review of Systems Weight is down 13# over the past  year Appetite is fair Sleeps okay without the machine (he feels) Bowels moving okay No trouble voiding    Objective:   Physical Exam  Constitutional: He appears well-developed. No distress.  Neck: No thyromegaly present.  Cardiovascular: Normal rate, regular rhythm and normal heart sounds. Exam reveals no gallop.  No murmur heard. Respiratory: Effort normal and breath sounds normal. No respiratory distress. He has no wheezes. He has no rales.  GI: Soft. There is no abdominal tenderness.  Musculoskeletal:        General: No edema.  Lymphadenopathy:    He has no cervical adenopathy.  Neurological:  Doesn't know month or year President-- ??  Psychiatric: He has a normal mood and affect. His behavior is normal.           Assessment & Plan:

## 2020-02-08 NOTE — Assessment & Plan Note (Signed)
Will monitor yearly

## 2020-02-08 NOTE — Assessment & Plan Note (Signed)
Clear pattern of vascular dementia Will need more support here in [place] but still seems okay for this Is on coumadin

## 2020-02-28 ENCOUNTER — Telehealth: Payer: Self-pay

## 2020-02-28 NOTE — Telephone Encounter (Signed)
Pt is at [facility] and is due for INR on 4/5. No fax or call has been received. Tried to contact Luellen Pucker, the nurse, but line is busy

## 2020-02-29 LAB — POCT INR: INR: 2.2 (ref 2.0–3.0)

## 2020-02-29 NOTE — Telephone Encounter (Signed)
Pt was due for INR check on 4/5. LVM for [name] at [facility].

## 2020-03-01 ENCOUNTER — Ambulatory Visit (INDEPENDENT_AMBULATORY_CARE_PROVIDER_SITE_OTHER): Payer: Medicare Other

## 2020-03-01 DIAGNOSIS — Z7901 Long term (current) use of anticoagulants: Secondary | ICD-10-CM | POA: Diagnosis not present

## 2020-03-01 NOTE — Telephone Encounter (Signed)
Left another VM for Luellen Pucker to return call.

## 2020-03-01 NOTE — Patient Instructions (Addendum)
Pre visit review using our clinic review tool, if applicable. No additional management support is needed unless otherwise documented below in the visit note.  Continue taking 2.5mg  daily except 5mg  on Sunday. Recheck in 1 month

## 2020-04-04 LAB — POCT INR: INR: 2.3 (ref 2.0–3.0)

## 2020-04-05 ENCOUNTER — Ambulatory Visit (INDEPENDENT_AMBULATORY_CARE_PROVIDER_SITE_OTHER): Payer: Medicare Other

## 2020-04-05 DIAGNOSIS — Z7901 Long term (current) use of anticoagulants: Secondary | ICD-10-CM

## 2020-04-05 NOTE — Patient Instructions (Addendum)
Pre visit review using our clinic review tool, if applicable. No additional management support is needed unless otherwise documented below in the visit note.  Continue taking 2.5mg  daily except 5mg  on Sunday. Recheck in 1 month. Burnell Blanks, RN at [facility], and advised of instructions. Luellen Pucker verbalized understanding. [phone]

## 2020-04-14 ENCOUNTER — Emergency Department: Payer: Medicare Other

## 2020-04-14 ENCOUNTER — Other Ambulatory Visit: Payer: Self-pay

## 2020-04-14 ENCOUNTER — Encounter: Payer: Self-pay | Admitting: Emergency Medicine

## 2020-04-14 ENCOUNTER — Emergency Department
Admission: EM | Admit: 2020-04-14 | Discharge: 2020-04-14 | Disposition: A | Discharge status: Home / Self Care | Payer: Medicare Other | Attending: Emergency Medicine | Admitting: Emergency Medicine

## 2020-04-14 DIAGNOSIS — M25551 Pain in right hip: Secondary | ICD-10-CM

## 2020-04-14 DIAGNOSIS — Z9989 Dependence on other enabling machines and devices: Secondary | ICD-10-CM | POA: Diagnosis not present

## 2020-04-14 DIAGNOSIS — F0150 Vascular dementia without behavioral disturbance: Secondary | ICD-10-CM

## 2020-04-14 DIAGNOSIS — Z87891 Personal history of nicotine dependence: Secondary | ICD-10-CM | POA: Diagnosis not present

## 2020-04-14 DIAGNOSIS — G4733 Obstructive sleep apnea (adult) (pediatric): Secondary | ICD-10-CM | POA: Insufficient documentation

## 2020-04-14 DIAGNOSIS — I5032 Chronic diastolic (congestive) heart failure: Secondary | ICD-10-CM | POA: Diagnosis not present

## 2020-04-14 DIAGNOSIS — N183 Chronic kidney disease, stage 3 unspecified: Secondary | ICD-10-CM | POA: Insufficient documentation

## 2020-04-14 DIAGNOSIS — I4891 Unspecified atrial fibrillation: Secondary | ICD-10-CM | POA: Diagnosis not present

## 2020-04-14 LAB — BASIC METABOLIC PANEL
Anion gap: 9 (ref 5–15)
BUN: 32 mg/dL — ABNORMAL HIGH (ref 8–23)
CO2: 29 mmol/L (ref 22–32)
Calcium: 9.5 mg/dL (ref 8.9–10.3)
Chloride: 101 mmol/L (ref 98–111)
Creatinine, Ser: 1.82 mg/dL — ABNORMAL HIGH (ref 0.61–1.24)
GFR calc Af Amer: 37 mL/min — ABNORMAL LOW (ref >60)
GFR calc non Af Amer: 32 mL/min — ABNORMAL LOW (ref >60)
Glucose, Bld: 90 mg/dL (ref 70–99)
Potassium: 4.7 mmol/L (ref 3.5–5.1)
Sodium: 139 mmol/L (ref 135–145)

## 2020-04-14 LAB — CBC WITH DIFFERENTIAL/PLATELET
Abs Immature Granulocytes: 0.02 10*3/uL (ref 0.00–0.07)
Basophils Absolute: 0.1 10*3/uL (ref 0.0–0.1)
Basophils Relative: 1 %
Eosinophils Absolute: 0.4 10*3/uL (ref 0.0–0.5)
Eosinophils Relative: 6 %
HCT: 39 % (ref 39.0–52.0)
Hemoglobin: 12.4 g/dL — ABNORMAL LOW (ref 13.0–17.0)
Immature Granulocytes: 0 %
Lymphocytes Relative: 16 %
Lymphs Abs: 1 10*3/uL (ref 0.7–4.0)
MCH: 30.6 pg (ref 26.0–34.0)
MCHC: 31.8 g/dL (ref 30.0–36.0)
MCV: 96.3 fL (ref 80.0–100.0)
Monocytes Absolute: 0.7 10*3/uL (ref 0.1–1.0)
Monocytes Relative: 12 %
Neutro Abs: 3.9 10*3/uL (ref 1.7–7.7)
Neutrophils Relative %: 65 %
Platelets: 207 10*3/uL (ref 150–400)
RBC: 4.05 MIL/uL — ABNORMAL LOW (ref 4.22–5.81)
RDW: 13.7 % (ref 11.5–15.5)
WBC: 6.1 10*3/uL (ref 4.0–10.5)
nRBC: 0 % (ref 0.0–0.2)

## 2020-04-14 LAB — BRAIN NATRIURETIC PEPTIDE: B Natriuretic Peptide: 269.8 pg/mL — ABNORMAL HIGH (ref 0.0–100.0)

## 2020-04-14 NOTE — ED Notes (Signed)
ED Provider at bedside. Family at bedside - daughter, Sula Soda, reports pt usually walks "around the pond four or five times but now only once"  Pt c/o right hip pain below the joint, area appears without redness or deformity, pt was able to walk in the room (restricted gait but not shuffling), pt with +3 pitting edema to both lower legs (daughter reports hx of same) pt with cane in room but was not using it when walking to the toilet, pt is also supposed to have hearing aids and none present

## 2020-04-14 NOTE — ED Provider Notes (Signed)
[facility] Emergency Department Provider Note ____________________________________________   First MD Initiated Contact with Patient 04/14/20 1911     (approximate)  I have reviewed the triage vital signs and the nursing notes.   HISTORY  Chief Complaint Fall and Leg Swelling  HPI [name] is a 86 y.o. male history of dementia presents to the emergency department from [facility].  Per EMS they were called to have him evaluated for right hip pain as he is not ambulating per his usual.  No witnessed fall and patient denies falling. Daughter is at bedside and says that he has not seemed himself lately that he has been complaining of feeling shaky and cold.  Daughter states that he is also been complaining of right hip pain.        Past Medical History:  Diagnosis Date  . AAA (abdominal aortic aneurysm) without rupture ([place]) 03/03/2013   Small, partially thrombosed saccular aneurysm  . Arthritis    HANDS  . Atrial fibrillation ([place]) 02/10/2013  . Cancer ([place])    SKIN CANCERS  . Chronic diastolic congestive heart failure ([place]) 03/20/2013  . Chronic kidney disease, stage III (moderate)   . Constipation   . Erectile dysfunction   . Hearing loss    BILATERAL - WEARS HEARING AIDS  . History of torn meniscus of left knee    PAINFUL LEFT KNEE  . Mitral valve prolapse   . Mitral valve regurgitation   . OSA on CPAP   . S/P Maze operation for atrial fibrillation 03/15/2013   Complete bilateral lesion set using cryothermy and bipolar radiofrequency ablation with oversewing of LA appendage via right mini thoracotomy  . Spinal stenosis, thoracic 03/03/2013   T5-6    Patient Active Problem List   Diagnosis Date Noted  . Vascular dementia without behavioral disturbance ([place]) 02/08/2020  . Thrombosis of saphenous vein, right 01/12/2019  . Anemia of chronic renal failure, stage 3 (moderate) 10/14/2018  . Gout 10/06/2017  . Osteoarthrosis involving multiple  sites 09/10/2016  . Constipation 06/11/2016  . Chronic kidney disease, stage III (moderate)   . S/P MVR (mitral valve repair) 03/28/2015  . Encounter for therapeutic drug monitoring 12/26/2013  . OSA on CPAP   . Chronic diastolic congestive heart failure ([place]) 03/20/2013  . S/P Maze operation for atrial fibrillation 03/15/2013  . MR (mitral regurgitation) 03/03/2013  . Spinal stenosis, thoracic 03/03/2013  . AAA (abdominal aortic aneurysm) without rupture ([place]) 03/03/2013  . Mitral valve prolapse, nonrheumatic 01/24/2013    Past Surgical History:  Procedure Laterality Date  . CARPAL TUNNEL RELEASE     BILATERAL  . [place]  . INTRAOPERATIVE TRANSESOPHAGEAL ECHOCARDIOGRAM Right 03/15/2013   Procedure: INTRAOPERATIVE TRANSESOPHAGEAL ECHOCARDIOGRAM;  Surgeon: Rexene Alberts, MD;  Location: [place];  Service: Open Heart Surgery;  Laterality: Right;  . MINIMALLY INVASIVE MAZE PROCEDURE Right 03/15/2013   Procedure: MINIMALLY INVASIVE MAZE PROCEDURE;  Surgeon: Rexene Alberts, MD;  Location: [place];  Service: Open Heart Surgery;  Laterality: Right;  (R) AXILLARY CANNULATION  . MITRAL VALVE REPAIR Right 03/15/2013   Procedure: MINIMALLY INVASIVE MITRAL VALVE REPAIR (MVR);  Surgeon: Rexene Alberts, MD;  Location: [place];  Service: Open Heart Surgery;  Laterality: Right;  . RIGHT ROTATOR CUFF REPAIR    . SKIN CANCERS REMOVED FROM BOTH LEGS    . SKIN CANCERS REMOVED FROM LEFT FOREHEAD AND LEFT NOSE    . SURGERY LEFT NECK FOR REMOVAL  OF METASTATIC LYMPH NODE ( FROM CANCER LEFT FOREHEAD)  AND LEFT NECK DISSECTION-REST OF LYMPH NODES NEGATIVE FOR ANY CANCER    . TEE WITHOUT CARDIOVERSION N/A 01/31/2013   Procedure: TRANSESOPHAGEAL ECHOCARDIOGRAM (TEE);  Surgeon: Sueanne Margarita, MD;  Location[facility] ENDOSCOPY;  Service: Cardiovascular;  Laterality: N/A;    Prior to Admission medications   Medication Sig Start Date End Date Taking? Authorizing Provider  acetaminophen (TYLENOL) 650 MG  CR tablet Take 650 mg by mouth 3 (three) times daily.    [provider]  allopurinol (ZYLOPRIM) 100 MG tablet Take 100 mg by mouth daily.    [provider]  cetirizine (ZYRTEC) 10 MG tablet Take 10 mg by mouth daily.     [provider]  cholecalciferol (VITAMIN D) 1000 UNITS tablet Take 1,000 Units by mouth every morning. Reported on 03/19/2016    [provider]  cyanocobalamin (,VITAMIN B-12,) 1000 MCG/ML injection Inject 1,000 mcg into the muscle every 14 (fourteen) days.     [provider]  furosemide (LASIX) 40 MG tablet Take 40 mg by mouth daily.    [provider]  polyethylene glycol (MIRALAX / GLYCOLAX) packet Take 17 g by mouth daily.    [provider]  potassium chloride (K-DUR) 10 MEQ tablet Take 10 mEq by mouth daily.    [provider]  senna-docusate (SENOKOT-S) 8.6-50 MG tablet Take 2 tablets by mouth daily. 09/10/16   Venia Carbon, MD  sodium chloride (OCEAN) 0.65 % SOLN nasal spray Place 1 spray into both nostrils as needed for congestion.    [provider]  warfarin (COUMADIN) 5 MG tablet TAKE AS DIRECTED BY COUMADIN CLINIC 02/03/16   Sueanne Margarita, MD    Allergies Statins and Latex  Family History  Problem Relation Age of Onset  . Coronary artery disease Father   . Heart disease Father   . Heart attack Father   . Diabetes Mother   . Arthritis Mother   . Congestive Heart Failure Mother   . Alzheimer's disease Mother   . Alzheimer's disease Sister     Social History Social History   Tobacco Use  . Smoking status: Former Smoker    Packs/day: 1.00    Years: 20.00    Pack years: 20.00    Start date: 11/23/1960  . Smokeless tobacco: Never Used  . Tobacco comment: QUIT SMOKING 1962  Substance Use Topics  . Alcohol use: Yes    Alcohol/week: 7.0 standard drinks    Types: 7 Standard drinks or equivalent per week    Comment: Cocktail or glass of wine with dinner  . Drug use:  No    Review of Systems  Constitutional: No fever/chills Eyes: No visual changes. ENT: No sore throat. Cardiovascular: Denies chest pain. Respiratory: Denies shortness of breath. Gastrointestinal: No abdominal pain.  No nausea, no vomiting.  No diarrhea.  No constipation. Genitourinary: Negative for dysuria. Musculoskeletal: Negative for back pain. Skin: Negative for rash. Neurological: Negative for headaches, focal weakness or numbness. ____________________________________________   PHYSICAL EXAM:  VITAL SIGNS: ED Triage Vitals [04/14/20 1603]  Enc Vitals Group     BP (!) 144/85     Pulse Rate 65     Resp 16     Temp 98.9 F (37.2 C)     Temp Source Oral     SpO2 100 %     Weight      Height      Head Circumference  Peak Flow      Pain Score      Pain Loc      Pain Edu?      Excl. in [place]?     Constitutional: Alert and oriented. Well appearing and in no acute distress. Eyes: Conjunctivae are normal. PERRL. EOMI. Head: Atraumatic. Nose: No congestion/rhinnorhea. Mouth/Throat: Mucous membranes are moist.  Oropharynx non-erythematous. Neck: No stridor.   Hematological/Lymphatic/Immunilogical: No cervical lymphadenopathy. Cardiovascular: Normal rate, regular rhythm. Grossly normal heart sounds.  Good peripheral circulation. Respiratory: Normal respiratory effort.  No retractions. Lungs CTAB. Gastrointestinal: Soft and nontender. No distention. No abdominal bruits. No CVA tenderness. Genitourinary:  Musculoskeletal: No lower extremity tenderness nor edema.  No joint effusions. Neurologic:  Normal speech and language. No gross focal neurologic deficits are appreciated. No gait instability. Skin:  Skin is warm, dry and intact. No rash noted. Psychiatric: Mood and affect are normal. Speech and behavior are normal.  ____________________________________________   LABS (all labs ordered are listed, but only abnormal results are displayed)  Labs Reviewed  CBC WITH  DIFFERENTIAL/PLATELET - Abnormal; Notable for the following components:      Result Value   RBC 4.05 (*)    Hemoglobin 12.4 (*)    All other components within normal limits  BASIC METABOLIC PANEL - Abnormal; Notable for the following components:   BUN 32 (*)    Creatinine, Ser 1.82 (*)    GFR calc non Af Amer 32 (*)    GFR calc Af Amer 37 (*)    All other components within normal limits  BRAIN NATRIURETIC PEPTIDE - Abnormal; Notable for the following components:   B Natriuretic Peptide 269.8 (*)    All other components within normal limits   ____________________________________________  EKG  Not indicated ____________________________________________  RADIOLOGY  ED MD interpretation:    No acute bony abnormality of the right hip or pelvis.  I, Sherrie George, personally viewed and evaluated these images (plain radiographs) as part of my medical decision making, as well as reviewing the written report by the radiologist.  Official radiology report(s): DG HIP UNILAT WITH PELVIS 2-3 VIEWS RIGHT  Result Date: 04/14/2020 CLINICAL DATA:  Right hip pain, lower extremity edema EXAM: DG HIP (WITH OR WITHOUT PELVIS) 2-3V RIGHT COMPARISON:  None. FINDINGS: Frontal view of the pelvis as well as frontal and frogleg lateral views of the right hip are obtained. No fracture, subluxation, or dislocation within either hip. Joint spaces are relatively well preserved. Prominent lower lumbar spondylosis. The remainder of the bony pelvis is unremarkable. Evidence of prior bilateral inguinal hernia repairs. IMPRESSION: 1. No acute bony abnormality. Electronically Signed   By: Randa Ngo M.D.   On: 04/14/2020 20:04    ____________________________________________   PROCEDURES  Procedure(s) performed (including Critical Care):  Procedures  ____________________________________________   INITIAL IMPRESSION / ASSESSMENT AND PLAN     86 year old male presenting to the emergency department for  treatment and evaluation of symptoms as described in the HPI.  While awaiting ER room assignment, protocol labs were drawn.  Slight bump in his BUN and creatinine but not indicative of acute kidney injury.  Plan will be to get an x-ray of the right hip and then likely discharge him home.  Daughter at bedside who agrees with this plan.  DIFFERENTIAL DIAGNOSIS  Right hip pain, weakness, progression of dementia.  ED COURSE  Image of the right hip is reassuring. Patient will be discharged home to follow up with primary care. Daughter will have him see  primary care for symptoms that change or worsen. ____________________________________________   FINAL CLINICAL IMPRESSION(S) / ED DIAGNOSES  Final diagnoses:  Acute right hip pain  Vascular dementia without behavioral disturbance [facility])     ED Discharge Orders    None       [name] was evaluated in Emergency Department on 04/14/2020 for the symptoms described in the history of present illness. He was evaluated in the context of the global COVID-19 pandemic, which necessitated consideration that the patient might be at risk for infection with the SARS-CoV-2 virus that causes COVID-19. Institutional protocols and algorithms that pertain to the evaluation of patients at risk for COVID-19 are in a state of rapid change based on information released by regulatory bodies including the CDC and federal and state organizations. These policies and algorithms were followed during the patient's care in the ED.   Note:  This document was prepared using Dragon voice recognition software and may include unintentional dictation errors.   Victorino Dike, FNP 04/14/20 2034    Blake Divine, MD 04/15/20 [phone]

## 2020-04-14 NOTE — ED Notes (Signed)
Pt wheeled and loaded into car, cane and DC with daughter, Sula Soda  No peripheral IV placed this visit.    Discharge instructions reviewed with patient. Questions fielded by this RN. Patient verbalizes understanding of instructions. Patient discharged home in stable condition per Gwenyth Ober, NP. No acute distress noted at time of discharge.

## 2020-04-14 NOTE — ED Triage Notes (Signed)
Pt to ED via [facility] from [facility]. Per EMS they were called out for a fall. Pt denies falling but does have hx/o dementia. Pt states that he is having pain on the right lower side of his back. Pt does not appear to be in distress.   Pt dtr states that when they went to visit pt on Friday he did not appear to be himself, pts vitals were normal.

## 2020-04-14 NOTE — ED Notes (Signed)
First Nurse Note: Pt to ED via [facility] from [facility] for a Fall. Pt is in NAD. Per EMS pt has hx/o Dementia but no documented hx.

## 2020-04-14 NOTE — ED Notes (Signed)
Pt is having increased edema in both legs.

## 2020-04-18 DIAGNOSIS — F0150 Vascular dementia without behavioral disturbance: Secondary | ICD-10-CM

## 2020-04-18 DIAGNOSIS — M4804 Spinal stenosis, thoracic region: Secondary | ICD-10-CM

## 2020-04-18 DIAGNOSIS — M25551 Pain in right hip: Secondary | ICD-10-CM | POA: Diagnosis not present

## 2020-04-18 DIAGNOSIS — I5032 Chronic diastolic (congestive) heart failure: Secondary | ICD-10-CM | POA: Diagnosis not present

## 2020-05-02 DIAGNOSIS — I480 Paroxysmal atrial fibrillation: Secondary | ICD-10-CM

## 2020-05-02 DIAGNOSIS — M4804 Spinal stenosis, thoracic region: Secondary | ICD-10-CM

## 2020-05-02 DIAGNOSIS — I5032 Chronic diastolic (congestive) heart failure: Secondary | ICD-10-CM

## 2020-05-02 DIAGNOSIS — F0150 Vascular dementia without behavioral disturbance: Secondary | ICD-10-CM

## 2020-05-09 ENCOUNTER — Ambulatory Visit: Payer: Self-pay

## 2020-05-16 DIAGNOSIS — D229 Melanocytic nevi, unspecified: Secondary | ICD-10-CM

## 2020-05-29 DIAGNOSIS — N183 Chronic kidney disease, stage 3 unspecified: Secondary | ICD-10-CM

## 2020-05-29 DIAGNOSIS — M1000 Idiopathic gout, unspecified site: Secondary | ICD-10-CM

## 2020-05-29 DIAGNOSIS — F0150 Vascular dementia without behavioral disturbance: Secondary | ICD-10-CM | POA: Diagnosis not present

## 2020-05-29 DIAGNOSIS — M25551 Pain in right hip: Secondary | ICD-10-CM

## 2020-05-29 DIAGNOSIS — I4891 Unspecified atrial fibrillation: Secondary | ICD-10-CM

## 2020-05-29 DIAGNOSIS — M4804 Spinal stenosis, thoracic region: Secondary | ICD-10-CM

## 2020-05-29 DIAGNOSIS — I719 Aortic aneurysm of unspecified site, without rupture: Secondary | ICD-10-CM

## 2020-05-29 DIAGNOSIS — I5030 Unspecified diastolic (congestive) heart failure: Secondary | ICD-10-CM

## 2020-06-28 DIAGNOSIS — F0150 Vascular dementia without behavioral disturbance: Secondary | ICD-10-CM | POA: Diagnosis not present

## 2020-06-28 DIAGNOSIS — I714 Abdominal aortic aneurysm, without rupture: Secondary | ICD-10-CM

## 2020-06-28 DIAGNOSIS — I4891 Unspecified atrial fibrillation: Secondary | ICD-10-CM | POA: Diagnosis not present

## 2020-06-28 DIAGNOSIS — M4804 Spinal stenosis, thoracic region: Secondary | ICD-10-CM

## 2020-06-28 DIAGNOSIS — I5030 Unspecified diastolic (congestive) heart failure: Secondary | ICD-10-CM

## 2020-06-28 DIAGNOSIS — M1000 Idiopathic gout, unspecified site: Secondary | ICD-10-CM

## 2020-06-28 DIAGNOSIS — M25551 Pain in right hip: Secondary | ICD-10-CM

## 2020-06-28 DIAGNOSIS — N183 Chronic kidney disease, stage 3 unspecified: Secondary | ICD-10-CM

## 2020-06-28 DIAGNOSIS — S5011XA Contusion of right forearm, initial encounter: Secondary | ICD-10-CM

## 2020-07-03 DIAGNOSIS — S5011XA Contusion of right forearm, initial encounter: Secondary | ICD-10-CM

## 2020-07-19 DIAGNOSIS — S81812A Laceration without foreign body, left lower leg, initial encounter: Secondary | ICD-10-CM

## 2020-07-26 DIAGNOSIS — I5030 Unspecified diastolic (congestive) heart failure: Secondary | ICD-10-CM

## 2020-08-21 DIAGNOSIS — B351 Tinea unguium: Secondary | ICD-10-CM | POA: Diagnosis not present

## 2020-09-12 DIAGNOSIS — F0150 Vascular dementia without behavioral disturbance: Secondary | ICD-10-CM

## 2020-09-12 DIAGNOSIS — N1832 Chronic kidney disease, stage 3b: Secondary | ICD-10-CM

## 2020-09-12 DIAGNOSIS — I5032 Chronic diastolic (congestive) heart failure: Secondary | ICD-10-CM

## 2020-09-12 DIAGNOSIS — I480 Paroxysmal atrial fibrillation: Secondary | ICD-10-CM | POA: Diagnosis not present

## 2020-09-12 DIAGNOSIS — M4804 Spinal stenosis, thoracic region: Secondary | ICD-10-CM | POA: Diagnosis not present

## 2020-10-11 DIAGNOSIS — L989 Disorder of the skin and subcutaneous tissue, unspecified: Secondary | ICD-10-CM

## 2020-10-30 DIAGNOSIS — N183 Chronic kidney disease, stage 3 unspecified: Secondary | ICD-10-CM

## 2020-10-30 DIAGNOSIS — M4804 Spinal stenosis, thoracic region: Secondary | ICD-10-CM

## 2020-10-30 DIAGNOSIS — I5030 Unspecified diastolic (congestive) heart failure: Secondary | ICD-10-CM

## 2020-10-30 DIAGNOSIS — F0150 Vascular dementia without behavioral disturbance: Secondary | ICD-10-CM | POA: Diagnosis not present

## 2020-10-30 DIAGNOSIS — M25551 Pain in right hip: Secondary | ICD-10-CM

## 2020-10-30 DIAGNOSIS — I714 Abdominal aortic aneurysm, without rupture: Secondary | ICD-10-CM | POA: Diagnosis not present

## 2020-11-07 DIAGNOSIS — R310 Gross hematuria: Secondary | ICD-10-CM

## 2020-11-09 ENCOUNTER — Other Ambulatory Visit: Payer: Self-pay

## 2020-11-09 ENCOUNTER — Inpatient Hospital Stay
Admission: EM | Admit: 2020-11-09 | Discharge: 2020-11-11 | DRG: 683 | Payer: Medicare Other | Attending: Internal Medicine | Admitting: Internal Medicine

## 2020-11-09 ENCOUNTER — Emergency Department: Payer: Medicare Other

## 2020-11-09 DIAGNOSIS — N179 Acute kidney failure, unspecified: Secondary | ICD-10-CM | POA: Diagnosis not present

## 2020-11-09 DIAGNOSIS — K5900 Constipation, unspecified: Secondary | ICD-10-CM | POA: Diagnosis present

## 2020-11-09 DIAGNOSIS — I480 Paroxysmal atrial fibrillation: Secondary | ICD-10-CM | POA: Diagnosis present

## 2020-11-09 DIAGNOSIS — T45515A Adverse effect of anticoagulants, initial encounter: Secondary | ICD-10-CM | POA: Diagnosis present

## 2020-11-09 DIAGNOSIS — N183 Chronic kidney disease, stage 3 unspecified: Secondary | ICD-10-CM | POA: Diagnosis present

## 2020-11-09 DIAGNOSIS — I714 Abdominal aortic aneurysm, without rupture, unspecified: Secondary | ICD-10-CM | POA: Diagnosis present

## 2020-11-09 DIAGNOSIS — Z888 Allergy status to other drugs, medicaments and biological substances status: Secondary | ICD-10-CM

## 2020-11-09 DIAGNOSIS — I5032 Chronic diastolic (congestive) heart failure: Secondary | ICD-10-CM | POA: Diagnosis present

## 2020-11-09 DIAGNOSIS — L89311 Pressure ulcer of right buttock, stage 1: Secondary | ICD-10-CM | POA: Diagnosis present

## 2020-11-09 DIAGNOSIS — M109 Gout, unspecified: Secondary | ICD-10-CM | POA: Diagnosis present

## 2020-11-09 DIAGNOSIS — Z8679 Personal history of other diseases of the circulatory system: Secondary | ICD-10-CM

## 2020-11-09 DIAGNOSIS — Z79899 Other long term (current) drug therapy: Secondary | ICD-10-CM

## 2020-11-09 DIAGNOSIS — Z66 Do not resuscitate: Secondary | ICD-10-CM | POA: Diagnosis present

## 2020-11-09 DIAGNOSIS — N2581 Secondary hyperparathyroidism of renal origin: Secondary | ICD-10-CM | POA: Diagnosis present

## 2020-11-09 DIAGNOSIS — R310 Gross hematuria: Secondary | ICD-10-CM | POA: Diagnosis not present

## 2020-11-09 DIAGNOSIS — H9190 Unspecified hearing loss, unspecified ear: Secondary | ICD-10-CM | POA: Diagnosis present

## 2020-11-09 DIAGNOSIS — Z820 Family history of epilepsy and other diseases of the nervous system: Secondary | ICD-10-CM

## 2020-11-09 DIAGNOSIS — Z87891 Personal history of nicotine dependence: Secondary | ICD-10-CM

## 2020-11-09 DIAGNOSIS — L8990 Pressure ulcer of unspecified site, unspecified stage: Secondary | ICD-10-CM | POA: Insufficient documentation

## 2020-11-09 DIAGNOSIS — I130 Hypertensive heart and chronic kidney disease with heart failure and stage 1 through stage 4 chronic kidney disease, or unspecified chronic kidney disease: Secondary | ICD-10-CM | POA: Diagnosis present

## 2020-11-09 DIAGNOSIS — N138 Other obstructive and reflux uropathy: Secondary | ICD-10-CM | POA: Diagnosis present

## 2020-11-09 DIAGNOSIS — F0150 Vascular dementia without behavioral disturbance: Secondary | ICD-10-CM | POA: Diagnosis present

## 2020-11-09 DIAGNOSIS — M898X9 Other specified disorders of bone, unspecified site: Secondary | ICD-10-CM | POA: Diagnosis present

## 2020-11-09 DIAGNOSIS — Z7901 Long term (current) use of anticoagulants: Secondary | ICD-10-CM

## 2020-11-09 DIAGNOSIS — Z85828 Personal history of other malignant neoplasm of skin: Secondary | ICD-10-CM

## 2020-11-09 DIAGNOSIS — I482 Chronic atrial fibrillation, unspecified: Secondary | ICD-10-CM | POA: Diagnosis present

## 2020-11-09 DIAGNOSIS — I700 Atherosclerosis of aorta: Secondary | ICD-10-CM | POA: Diagnosis present

## 2020-11-09 DIAGNOSIS — Z20822 Contact with and (suspected) exposure to covid-19: Secondary | ICD-10-CM | POA: Diagnosis present

## 2020-11-09 DIAGNOSIS — D6832 Hemorrhagic disorder due to extrinsic circulating anticoagulants: Secondary | ICD-10-CM | POA: Diagnosis present

## 2020-11-09 DIAGNOSIS — Z974 Presence of external hearing-aid: Secondary | ICD-10-CM

## 2020-11-09 DIAGNOSIS — N401 Enlarged prostate with lower urinary tract symptoms: Secondary | ICD-10-CM | POA: Diagnosis present

## 2020-11-09 DIAGNOSIS — M19041 Primary osteoarthritis, right hand: Secondary | ICD-10-CM | POA: Diagnosis present

## 2020-11-09 DIAGNOSIS — Z91040 Latex allergy status: Secondary | ICD-10-CM

## 2020-11-09 DIAGNOSIS — Z8261 Family history of arthritis: Secondary | ICD-10-CM

## 2020-11-09 DIAGNOSIS — M19042 Primary osteoarthritis, left hand: Secondary | ICD-10-CM | POA: Diagnosis present

## 2020-11-09 DIAGNOSIS — Z833 Family history of diabetes mellitus: Secondary | ICD-10-CM

## 2020-11-09 DIAGNOSIS — R319 Hematuria, unspecified: Secondary | ICD-10-CM | POA: Diagnosis present

## 2020-11-09 DIAGNOSIS — R338 Other retention of urine: Secondary | ICD-10-CM

## 2020-11-09 DIAGNOSIS — Z9989 Dependence on other enabling machines and devices: Secondary | ICD-10-CM

## 2020-11-09 DIAGNOSIS — Z8249 Family history of ischemic heart disease and other diseases of the circulatory system: Secondary | ICD-10-CM

## 2020-11-09 DIAGNOSIS — G4733 Obstructive sleep apnea (adult) (pediatric): Secondary | ICD-10-CM

## 2020-11-09 DIAGNOSIS — N1832 Chronic kidney disease, stage 3b: Secondary | ICD-10-CM | POA: Diagnosis present

## 2020-11-09 DIAGNOSIS — M159 Polyosteoarthritis, unspecified: Secondary | ICD-10-CM | POA: Diagnosis present

## 2020-11-09 DIAGNOSIS — D500 Iron deficiency anemia secondary to blood loss (chronic): Secondary | ICD-10-CM | POA: Diagnosis present

## 2020-11-09 DIAGNOSIS — D631 Anemia in chronic kidney disease: Secondary | ICD-10-CM | POA: Diagnosis present

## 2020-11-09 LAB — RESP PANEL BY RT-PCR (FLU A&B, COVID) ARPGX2
Influenza A by PCR: NEGATIVE
Influenza B by PCR: NEGATIVE
SARS Coronavirus 2 by RT PCR: NEGATIVE

## 2020-11-09 LAB — PROTIME-INR
INR: 2 — ABNORMAL HIGH (ref 0.8–1.2)
Prothrombin Time: 22.3 seconds — ABNORMAL HIGH (ref 11.4–15.2)

## 2020-11-09 LAB — COMPREHENSIVE METABOLIC PANEL
ALT: 17 U/L (ref 0–44)
AST: 18 U/L (ref 15–41)
Albumin: 4.3 g/dL (ref 3.5–5.0)
Alkaline Phosphatase: 71 U/L (ref 38–126)
Anion gap: 12 (ref 5–15)
BUN: 66 mg/dL — ABNORMAL HIGH (ref 8–23)
CO2: 29 mmol/L (ref 22–32)
Calcium: 9.9 mg/dL (ref 8.9–10.3)
Chloride: 100 mmol/L (ref 98–111)
Creatinine, Ser: 2.74 mg/dL — ABNORMAL HIGH (ref 0.61–1.24)
GFR, Estimated: 21 mL/min — ABNORMAL LOW (ref >60)
Glucose, Bld: 85 mg/dL (ref 70–99)
Potassium: 4.6 mmol/L (ref 3.5–5.1)
Sodium: 141 mmol/L (ref 135–145)
Total Bilirubin: 1 mg/dL (ref 0.3–1.2)
Total Protein: 8.4 g/dL — ABNORMAL HIGH (ref 6.5–8.1)

## 2020-11-09 LAB — CBC
HCT: 36.2 % — ABNORMAL LOW (ref 39.0–52.0)
Hemoglobin: 11.7 g/dL — ABNORMAL LOW (ref 13.0–17.0)
MCH: 30.9 pg (ref 26.0–34.0)
MCHC: 32.3 g/dL (ref 30.0–36.0)
MCV: 95.5 fL (ref 80.0–100.0)
Platelets: 200 10*3/uL (ref 150–400)
RBC: 3.79 MIL/uL — ABNORMAL LOW (ref 4.22–5.81)
RDW: 13.6 % (ref 11.5–15.5)
WBC: 7.4 10*3/uL (ref 4.0–10.5)
nRBC: 0 % (ref 0.0–0.2)

## 2020-11-09 LAB — URINALYSIS, COMPLETE (UACMP) WITH MICROSCOPIC
Bacteria, UA: NONE SEEN
RBC / HPF: >50 RBC/hpf — ABNORMAL HIGH (ref 0–5)
Specific Gravity, Urine: 1.014 (ref 1.005–1.030)
Squamous Epithelial / HPF: NONE SEEN (ref 0–5)
WBC, UA: >50 WBC/hpf — ABNORMAL HIGH (ref 0–5)

## 2020-11-09 LAB — LIPASE, BLOOD: Lipase: 40 U/L (ref 11–51)

## 2020-11-09 MED ORDER — TRAMADOL HCL 50 MG PO TABS
50.0000 mg | ORAL_TABLET | Freq: Two times a day (BID) | ORAL | Status: DC | PRN
  Administered 2020-11-10: 50 mg via ORAL
  Filled 2020-11-09: qty 1

## 2020-11-09 MED ORDER — ONDANSETRON HCL 4 MG/2ML IJ SOLN: 4.0000 mg | Freq: Four times a day (QID) | INTRAMUSCULAR | Status: DC | PRN

## 2020-11-09 MED ORDER — LACTATED RINGERS IV BOLUS
1000.0000 mL | Freq: Once | INTRAVENOUS | Status: AC
  Administered 2020-11-09: 1000 mL via INTRAVENOUS

## 2020-11-09 MED ORDER — SODIUM CHLORIDE 0.9 % IV SOLN
1.0000 g | INTRAVENOUS | Status: DC
  Administered 2020-11-09 – 2020-11-10 (×2): 1 g via INTRAVENOUS
  Filled 2020-11-09 (×3): qty 10

## 2020-11-09 MED ORDER — ACETAMINOPHEN 325 MG PO TABS: 650.0000 mg | ORAL_TABLET | Freq: Four times a day (QID) | ORAL | Status: DC | PRN

## 2020-11-09 MED ORDER — DICLOFENAC SODIUM 1 % EX GEL
4.0000 g | Freq: Every day | CUTANEOUS | Status: DC
  Filled 2020-11-09 (×2): qty 100

## 2020-11-09 MED ORDER — ALLOPURINOL 100 MG PO TABS
100.0000 mg | ORAL_TABLET | Freq: Every day | ORAL | Status: DC
  Administered 2020-11-10: 10:00:00 100 mg via ORAL
  Filled 2020-11-09 (×2): qty 1

## 2020-11-09 MED ORDER — SENNOSIDES-DOCUSATE SODIUM 8.6-50 MG PO TABS
1.0000 | ORAL_TABLET | Freq: Every day | ORAL | Status: DC
  Filled 2020-11-09: qty 2
  Filled 2020-11-09: qty 1

## 2020-11-09 MED ORDER — LORAZEPAM 2 MG/ML IJ SOLN
0.5000 mg | Freq: Once | INTRAMUSCULAR | Status: AC
  Administered 2020-11-09: 21:00:00 0.5 mg via INTRAVENOUS
  Filled 2020-11-09: qty 1

## 2020-11-09 MED ORDER — LORATADINE 10 MG PO TABS
10.0000 mg | ORAL_TABLET | Freq: Every day | ORAL | Status: DC
  Filled 2020-11-09 (×2): qty 1

## 2020-11-09 MED ORDER — ONDANSETRON HCL 4 MG PO TABS: 4.0000 mg | ORAL_TABLET | Freq: Four times a day (QID) | ORAL | Status: DC | PRN

## 2020-11-09 MED ORDER — ACETAMINOPHEN 650 MG RE SUPP: 650.0000 mg | Freq: Four times a day (QID) | RECTAL | Status: DC | PRN

## 2020-11-09 MED ORDER — LACTATED RINGERS IV SOLN: INTRAVENOUS | Status: DC

## 2020-11-09 MED ORDER — MORPHINE SULFATE (PF) 2 MG/ML IV SOLN: 2.0000 mg | Freq: Once | INTRAVENOUS | Status: DC

## 2020-11-09 MED ORDER — POLYETHYLENE GLYCOL 3350 17 G PO PACK
17.0000 g | PACK | Freq: Every day | ORAL | Status: DC
  Filled 2020-11-09 (×2): qty 1

## 2020-11-09 MED ORDER — LIDOCAINE HCL URETHRAL/MUCOSAL 2 % EX GEL
1.0000 "application " | Freq: Once | CUTANEOUS | Status: AC
  Administered 2020-11-09: 1 via URETHRAL
  Filled 2020-11-09: qty 10

## 2020-11-09 MED ORDER — HALOPERIDOL LACTATE 5 MG/ML IJ SOLN
2.5000 mg | Freq: Once | INTRAMUSCULAR | Status: AC
  Administered 2020-11-09: 23:00:00 2.5 mg via INTRAVENOUS
  Filled 2020-11-09: qty 1

## 2020-11-09 NOTE — ED Notes (Signed)
Pt agitated, attempting to remove iv and foley. hospitalist to order ativan. Family has stepped out at this time for nourishment.

## 2020-11-09 NOTE — ED Notes (Signed)
Pt very agitated, removing monitoring equipment and iv. Safety mitts applied to bilateral hands with one finger width obtainable beneath ties. Pt able to move fingers independently under mitts.

## 2020-11-09 NOTE — H&P (Signed)
History and Physical    [name] FBP:[id number] DOB: [birth year] DOA: 11/09/2020  PCP: Venia Carbon, MD  Patient coming from: [facility].  I have personally briefly reviewed patient's old medical records in [place]  Chief Complaint: Abdominal pain and distention.  HPI: HOBSON LAX is a 86 y.o. male with medical history significant of AAA, osteoarthritis of the hands, chronic atrial fibrillation, unspecified skin cancer, chronic diastolic heart failure, stage III CKD, constipation, rectal dysfunction, hearing loss, left knee meniscus tear, mitral valve prolapse/mitral valve regurgitation, OSA on CPAP, maze procedure for atrial fibrillation, thoracic spinal stenosis at T5-T6 who is coming to the emergency department due to abdominal pain with abdominal distention, hematuria with penile pain and some urinary retention.  He is unable to provide further information.  His granddaughter state that they tried to unsuccessfully placed a Foley at the facility.  She states that prior to this he has been in his baseline state of health.  He has had a good appetite and all other than waking up early in the morning, he has been sleeping well.  ED Course: Initial vital signs were temperature 97.5 F, pulse 118, respiration 18, BP 136/70 mmHg O2 sat 100% on room air.  A Foley catheter was inserted in the emergency department.  Labwork: CBC shows a white count 7.4, hemoglobin 11.7 g/dL and platelets 200.  PT was 22.3 and INR 2.0.  CMP shows a BUN of 66 and creatinine of 2.74 mg/dL.  Total protein is 8.4 g/dL.  His most recent creatinine was 1.82 mg/dL in May of this year.  Lipase 40 units/L.  Imaging: CT abdomen/pelvis without contrast show enlarging infrarenal aortic aneurysm.  There is also high attenuation material within the lateral aspect of the bladder.  It is difficult to tell whether this reflects of tissue or blood products within the bladder lumen.  There is marked enlargement of  the prostate.  There is moderate gas and stool throughout the colon.  No obstruction or ileus.  There is aortic atherosclerosis.  Review of Systems: As per HPI otherwise all other systems reviewed and are negative.  Past Medical History:  Diagnosis Date  . AAA (abdominal aortic aneurysm) without rupture ([place]) 03/03/2013   Small, partially thrombosed saccular aneurysm  . Arthritis    HANDS  . Atrial fibrillation ([place]) 02/10/2013  . Cancer ([place])    SKIN CANCERS  . Chronic diastolic congestive heart failure ([place]) 03/20/2013  . Chronic kidney disease, stage III (moderate) (HCC)   . Constipation   . Erectile dysfunction   . Hearing loss    BILATERAL - WEARS HEARING AIDS  . History of torn meniscus of left knee    PAINFUL LEFT KNEE  . Mitral valve prolapse   . Mitral valve regurgitation   . OSA on CPAP   . S/P Maze operation for atrial fibrillation 03/15/2013   Complete bilateral lesion set using cryothermy and bipolar radiofrequency ablation with oversewing of LA appendage via right mini thoracotomy  . Spinal stenosis, thoracic 03/03/2013   T5-6   Past Surgical History:  Procedure Laterality Date  . CARPAL TUNNEL RELEASE     BILATERAL  . [place]  . INTRAOPERATIVE TRANSESOPHAGEAL ECHOCARDIOGRAM Right 03/15/2013   Procedure: INTRAOPERATIVE TRANSESOPHAGEAL ECHOCARDIOGRAM;  Surgeon: Rexene Alberts, MD;  Location: [place];  Service: Open Heart Surgery;  Laterality: Right;  . MINIMALLY INVASIVE MAZE PROCEDURE Right 03/15/2013   Procedure: MINIMALLY INVASIVE MAZE PROCEDURE;  Surgeon:  Rexene Alberts, MD;  Location: [place];  Service: Open Heart Surgery;  Laterality: Right;  (R) AXILLARY CANNULATION  . MITRAL VALVE REPAIR Right 03/15/2013   Procedure: MINIMALLY INVASIVE MITRAL VALVE REPAIR (MVR);  Surgeon: Rexene Alberts, MD;  Location: [place];  Service: Open Heart Surgery;  Laterality: Right;  . RIGHT ROTATOR CUFF REPAIR    . SKIN CANCERS REMOVED FROM BOTH LEGS    . SKIN  CANCERS REMOVED FROM LEFT FOREHEAD AND LEFT NOSE    . SURGERY LEFT NECK FOR REMOVAL OF METASTATIC LYMPH NODE ( FROM CANCER LEFT FOREHEAD)  AND LEFT NECK DISSECTION-REST OF LYMPH NODES NEGATIVE FOR ANY CANCER    . TEE WITHOUT CARDIOVERSION N/A 01/31/2013   Procedure: TRANSESOPHAGEAL ECHOCARDIOGRAM (TEE);  Surgeon: Sueanne Margarita, MD;  Location: [facility] ENDOSCOPY;  Service: Cardiovascular;  Laterality: N/A;   Social History  reports that he has quit smoking. He started smoking about 60 years ago. He has a 20.00 pack-year smoking history. He has never used smokeless tobacco. He reports current alcohol use of about 7.0 standard drinks of alcohol per week. He reports that he does not use drugs.  Allergies  Allergen Reactions  . Statins     Muscle pains  . Latex Rash    bandaids--no other latex problems   Family History  Problem Relation Age of Onset  . Coronary artery disease Father   . Heart disease Father   . Heart attack Father   . Diabetes Mother   . Arthritis Mother   . Congestive Heart Failure Mother   . Alzheimer's disease Mother   . Alzheimer's disease Sister    Prior to Admission medications   Medication Sig Start Date End Date Taking? Authorizing Provider  acetaminophen (TYLENOL) 650 MG CR tablet Take 650 mg by mouth 3 (three) times daily.   Yes [provider]  allopurinol (ZYLOPRIM) 100 MG tablet Take 100 mg by mouth daily.   Yes [provider]  cetirizine (ZYRTEC) 10 MG tablet Take 10 mg by mouth at bedtime.   Yes [provider]  cholecalciferol (VITAMIN D) 1000 UNITS tablet Take 1,000 Units by mouth every morning. Reported on 03/19/2016   Yes [provider]  cyanocobalamin (,VITAMIN B-12,) 1000 MCG/ML injection Inject 1,000 mcg into the muscle every 14 (fourteen) days.    Yes [provider]  diclofenac Sodium (VOLTAREN) 1 % GEL Apply 4 g topically daily. Apply to right hip area prn for hip pain   Yes [provider]   furosemide (LASIX) 40 MG tablet Take 40 mg by mouth daily.   Yes [provider]  polyethylene glycol (MIRALAX / GLYCOLAX) packet Take 17 g by mouth daily.   Yes [provider]  senna-docusate (SENOKOT-S) 8.6-50 MG tablet Take 2 tablets by mouth daily. Patient taking differently: Take 1-2 tablets by mouth daily. One tab in the am  2 tabs in the pm 09/10/16  Yes Venia Carbon, MD  spironolactone (ALDACTONE) 25 MG tablet Take 25 mg by mouth 2 (two) times daily. 09/16/20  Yes [provider]  traMADol (ULTRAM) 50 MG tablet Take 50 mg by mouth 2 (two) times daily as needed. 07/01/20  Yes [provider]  triamcinolone (KENALOG) 0.1 % Apply 1 application topically in the morning and at bedtime. 10/15/20  Yes [provider]  warfarin (COUMADIN) 5 MG tablet TAKE AS DIRECTED BY COUMADIN CLINIC Patient taking differently: Take 2.5-5 mg by mouth at bedtime. Take 2.5mg  on mon/tue/wed/thur/fri/sat Take  5mg   on sun 02/03/16  Yes Turner, Traci R, MD  potassium chloride (K-DUR) 10 MEQ tablet Take 10 mEq by mouth daily. Patient not taking: No sig reported    [provider]  sodium chloride (OCEAN) 0.65 % SOLN nasal spray Place 1 spray into both nostrils as needed for congestion. Patient not taking: No sig reported    [provider]   Physical Exam: Vitals:   11/09/20 1234 11/09/20 1345 11/09/20 1520  BP: 136/70 122/86 (!) 120/94  Pulse: (!) 118 76 73  Resp: 18 18 18   Temp: (!) 97.5 F (36.4 C)    SpO2: 100% 100% 99%  Weight: 65.8 kg    Height: 5\' 8"  (1.727 m)     Constitutional: Occasionally restless. Eyes: PERRL, lids and conjunctivae normal ENMT: Mucous membranes are mildly dry. Posterior pharynx clear of any exudate or lesions. Neck: normal, supple, no masses, no thyromegaly Respiratory: clear to auscultation bilaterally, no wheezing, no crackles. Normal respiratory effort. No accessory muscle use.  Cardiovascular: Regular rate  and rhythm, no murmurs / rubs / gallops. No extremity edema. 2+ pedal pulses. No carotid bruits.  Abdomen: Nondistended. Bowel sounds positive.  Soft,   positive suprapubic tenderness, no guarding or rebound, no masses palpated. No hepatosplenomegaly.  Musculoskeletal: Generalized weakness.  No clubbing / cyanosis.  Good ROM, no contractures. Normal muscle tone.  Skin: Some areas of ecchymosis on extremities from venipuncture. Neurologic: Seems grossly nonfocal. Psychiatric: Oriented to name only.  Restless at times.  Labs on Admission: I have personally reviewed following labs and imaging studies  CBC: Recent Labs  Lab 11/09/20 1237  WBC 7.4  HGB 11.7*  HCT 36.2*  MCV 95.5  PLT 119    Basic Metabolic Panel: Recent Labs  Lab 11/09/20 1237  NA 141  K 4.6  CL 100  CO2 29  GLUCOSE 85  BUN 66*  CREATININE 2.74*  CALCIUM 9.9    GFR: Estimated Creatinine Clearance: 17 mL/min (A) (by C-G formula based on SCr of 2.74 mg/dL (H)).  Liver Function Tests: Recent Labs  Lab 11/09/20 1237  AST 18  ALT 17  ALKPHOS 71  BILITOT 1.0  PROT 8.4*  ALBUMIN 4.3    Urine analysis:    Component Value Date/Time   COLORURINE RED (A) 11/09/2020 1532   APPEARANCEUR CLOUDY (A) 11/09/2020 1532   LABSPEC 1.014 11/09/2020 1532   PHURINE  11/09/2020 1532    TEST NOT REPORTED DUE TO COLOR INTERFERENCE OF URINE PIGMENT   GLUCOSEU (A) 11/09/2020 1532    TEST NOT REPORTED DUE TO COLOR INTERFERENCE OF URINE PIGMENT   HGBUR (A) 11/09/2020 1532    TEST NOT REPORTED DUE TO COLOR INTERFERENCE OF URINE PIGMENT   BILIRUBINUR (A) 11/09/2020 1532    TEST NOT REPORTED DUE TO COLOR INTERFERENCE OF URINE PIGMENT   KETONESUR (A) 11/09/2020 1532    TEST NOT REPORTED DUE TO COLOR INTERFERENCE OF URINE PIGMENT   PROTEINUR (A) 11/09/2020 1532    TEST NOT REPORTED DUE TO COLOR INTERFERENCE OF URINE PIGMENT   UROBILINOGEN 0.2 03/13/2013 1317   NITRITE (A) 11/09/2020 1532    TEST NOT REPORTED DUE TO COLOR  INTERFERENCE OF URINE PIGMENT   LEUKOCYTESUR (A) 11/09/2020 1532    TEST NOT REPORTED DUE TO COLOR INTERFERENCE OF URINE PIGMENT    Radiological Exams on Admission: CT ABDOMEN PELVIS WO CONTRAST  Result Date: 11/09/2020 CLINICAL DATA:  Abdominal pain and distension, hematuria, supratherapeutic INR EXAM: CT ABDOMEN AND PELVIS WITHOUT CONTRAST  TECHNIQUE: Multidetector CT imaging of the abdomen and pelvis was performed following the standard protocol without IV contrast. COMPARISON:  08/08/2013 FINDINGS: Lower chest: The heart is enlarged without pericardial effusion. No acute pleural or parenchymal lung disease. Hepatobiliary: No focal liver abnormality is seen. No gallstones, gallbladder wall thickening, or biliary dilatation. Pancreas: Unremarkable. No pancreatic ductal dilatation or surrounding inflammatory changes. Spleen: Normal in size without focal abnormality. Adrenals/Urinary Tract: There is a 4.8 cm cyst off the lower pole right kidney. Left kidney is unremarkable. No urinary tract calculi or obstructive uropathy. The adrenals are unremarkable. The bladder is decompressed with a Foley catheter. There is high attenuation seen within the left lateral aspect of the bladder. It is difficult to tell whether this reflects soft tissue or blood products within the bladder lumen. Stomach/Bowel: No bowel obstruction or ileus. Moderate gas and stool throughout the colon. No bowel wall thickening or inflammatory change. Vascular/Lymphatic: At the site of a previously identified thrombosed saccular infrarenal aortic aneurysm there is been significant enlargement of the aorta now measuring 5.3 x 5.3 cm in size, previously having measured 2.9 x 2.5 cm on the 08/08/2013 evaluation. Findings are consistent with an enlarged saccular infrarenal aortic aneurysm. Without IV contrast, evaluation of this aneurysm is limited. There is no periaortic fat stranding to suggest leak or rupture. Contrast CT of the abdomen and  pelvis is recommended for further characterization. There is extensive atherosclerosis throughout the aorta and its distal branches. Chronic distension of the left common iliac vein is noted, measuring up to 3.2 cm, previously measuring 2.9 cm. This is of uncertain significance. No pathologic adenopathy within the abdomen or pelvis. Reproductive: There is marked enlargement of the prostate, measuring 6.3 x 5.3 x 7.0 cm. Other: No free fluid or free gas. No abdominal wall hernia. Postsurgical changes from prior bilateral inguinal hernia repair. Musculoskeletal: No acute or destructive bony lesions. Reconstructed images demonstrate no additional findings. IMPRESSION: 1. Enlarging saccular infrarenal aortic aneurysm, now measuring up to 5.3 x 5.3 cm in size, previously having measured 2.9 x 2.5 cm on the 08/08/2013 evaluation. Without IV contrast, evaluation of this aneurysm is limited. Contrasted CT of the abdomen and pelvis is recommended. 2. High attenuation material within the left lateral aspect of the bladder. It is difficult to tell whether this reflects soft tissue or blood products within the bladder lumen. Cystoscopy may be useful for further evaluation. 3. Marked enlargement of the prostate. 4. Moderate gas and stool throughout the colon. No obstruction or ileus. 5.  Aortic Atherosclerosis (ICD10-I70.0). Critical Value/emergent results were called by telephone at the time of interpretation on 11/09/2020 at 3:20 pm to provider [facility] , who verbally acknowledged these results. Electronically Signed   By: Randa Ngo M.D.   On: 11/09/2020 15:20    EKG: Independently reviewed.  Assessment/Plan Principal Problem:   AKI (acute kidney injury) ([place]) Outlet obstruction from hematuria? Observation/MedSurg/telemetry. Continue IV fluids: LR 100 mL/h. Monitor intake and output. Follow renal function electrolytes. Consult urology in a.m.  Active Problems:   Hematuria Consult urology in  a.m. Warfarin has been on hold.    PAF (paroxysmal atrial fibrillation) (HCC) CHA?DS?-VASc Score of at least 5. Warfarin has been held.    AAA (abdominal aortic aneurysm) without rupture ([place]) Family not inclined to surgical oxygen. They would like to speak to vascular surgery though. Will consult vascular surgery in the morning.    OSA on CPAP Continue CPAP at bedtime.    Chronic kidney disease, stage III (  moderate) (HCC) Monitor renal function electrolytes.    Constipation Continue MiraLAX. Continue stool softeners.    Osteoarthrosis involving multiple sites Analgesics as needed.    Gout Continue colchicine 0.6 mg p.o. daily.    Anemia of chronic renal failure, stage 3 (moderate) (HCC) Monitor H&H.    Vascular dementia without behavioral disturbance ([place]) Supportive care.    DVT prophylaxis:      On warfarin. Code Status:   Full code. Family Communication: His granddaughter Estill Bamberg was present in the ED room. Disposition Plan:   Patient is from:  Home.  [place].  Anticipated DC to:  Home.  Anticipated DC date:  11/11/2020.  Anticipated DC barriers: Clinical status.  Consults called:   Admission status:  Observation/MedSurg.  High due to acute kidney injury  Severity of Illness:  Reubin Milan MD [facility]  How to contact the [facility] Attending or Consulting provider [place] or covering provider during after hours [place], for this patient?   1. Check the care team in [facility] and look for a) attending/consulting TRH provider listed and b) the [facility] team listed 2. Log into [email or web address] and use [place]'s universal password to access. If you do not have the password, please contact the hospital operator. 3. Locate the [facility] provider you are looking for under [facility] and page to a number that you can be directly reached. 4. If you still have difficulty reaching the provider, please page the [facility] (Director on Call) for the Hospitalists listed on  amion for assistance.  11/09/2020, 5:58 PM   This document was prepared using Dragon voice recognition software and may contain some unintended transcription errors.

## 2020-11-09 NOTE — ED Triage Notes (Addendum)
Pt comes with [facility] with granddaughter with c/o abdominal pain, distended abdomen, pain in penis and blood in urine. Pt unable to urinate. Pt's abdomen is distended.  Family states they did try to cath pt but unable to because of pt's pain and they feel he might have a blockage.  Family reports blood in urine last Thursday. Pt had UA and UC completed. Not sure of results.  Pt also on coudamin and levels were high so that was stopped yesterday per family.

## 2020-11-09 NOTE — ED Provider Notes (Signed)
[facility] Emergency Department Provider Note ____________________________________________   Event Date/Time   First MD Initiated Contact with Patient 11/09/20 1326     (approximate)  I have reviewed the triage vital signs and the nursing notes.  HISTORY  Chief Complaint Abdominal Pain and Hematuria   HPI [name] is a 86 y.o. malewho presents to the ED for evaluation of abdominal pain and hematuria.  Chart review indicates patient is anticoagulated on Coumadin for A. fib.  Known small infrarenal AAA.  Patient resides at a local SNF and presents to the ED with granddaughter at the bedside, provides majority of history due to patient's baseline disorientation and dementia.  Patient reports penile pain and suprapubic fullness and pain with inability to effectively urinate.  Unable to provide additional significant history due to his baseline dementia.  Granddaughter reports patient being worked up as an outpatient through his facility for the past couple days for gross hematuria.  She shows me a printout of blood work that was done 2 days ago with a supratherapeutic INR of about 9.  His Coumadin was held today, but due to his increasing penile pain and suprapubic pain, patient presents to the ED for evaluation.  Past Medical History:  Diagnosis Date  . AAA (abdominal aortic aneurysm) without rupture ([place]) 03/03/2013   Small, partially thrombosed saccular aneurysm  . Arthritis    HANDS  . Atrial fibrillation ([place]) 02/10/2013  . Cancer ([place])    SKIN CANCERS  . Chronic diastolic congestive heart failure ([place]) 03/20/2013  . Chronic kidney disease, stage III (moderate) (H[facility])   . Constipation   . Erectile dysfunction   . Hearing loss    BILATERAL - WEARS HEARING AIDS  . History of torn meniscus of left knee    PAINFUL LEFT KNEE  . Mitral valve prolapse   . Mitral valve regurgitation   . OSA on CPAP   . S/P Maze operation for atrial fibrillation  03/15/2013   Complete bilateral lesion set using cryothermy and bipolar radiofrequency ablation with oversewing of LA appendage via right mini thoracotomy  . Spinal stenosis, thoracic 03/03/2013   T5-6    Patient Active Problem List   Diagnosis Date Noted  . Vascular dementia without behavioral disturbance ([place]) 02/08/2020  . Thrombosis of saphenous vein, right 01/12/2019  . Anemia of chronic renal failure, stage 3 (moderate) ([place]) 10/14/2018  . Gout 10/06/2017  . Osteoarthrosis involving multiple sites 09/10/2016  . Constipation 06/11/2016  . Chronic kidney disease, stage III (moderate) (HCC)   . S/P MVR (mitral valve repair) 03/28/2015  . Encounter for therapeutic drug monitoring 12/26/2013  . OSA on CPAP   . Chronic diastolic congestive heart failure ([place]) 03/20/2013  . S/P Maze operation for atrial fibrillation 03/15/2013  . MR (mitral regurgitation) 03/03/2013  . Spinal stenosis, thoracic 03/03/2013  . AAA (abdominal aortic aneurysm) without rupture ([place]) 03/03/2013  . Mitral valve prolapse, nonrheumatic 01/24/2013    Past Surgical History:  Procedure Laterality Date  . CARPAL TUNNEL RELEASE     BILATERAL  . [place]  . INTRAOPERATIVE TRANSESOPHAGEAL ECHOCARDIOGRAM Right 03/15/2013   Procedure: INTRAOPERATIVE TRANSESOPHAGEAL ECHOCARDIOGRAM;  Surgeon: Rexene Alberts, MD;  Location: [facility];  Service: Open Heart Surgery;  Laterality: Right;  . MINIMALLY INVASIVE MAZE PROCEDURE Right 03/15/2013   Procedure: MINIMALLY INVASIVE MAZE PROCEDURE;  Surgeon: Rexene Alberts, MD;  Location: [facility];  Service: Open Heart Surgery;  Laterality: Right;  (R) AXILLARY  CANNULATION  . MITRAL VALVE REPAIR Right 03/15/2013   Procedure: MINIMALLY INVASIVE MITRAL VALVE REPAIR (MVR);  Surgeon: Rexene Alberts, MD;  Location: [facility];  Service: Open Heart Surgery;  Laterality: Right;  . RIGHT ROTATOR CUFF REPAIR    . SKIN CANCERS REMOVED FROM BOTH LEGS    . SKIN CANCERS REMOVED FROM LEFT  FOREHEAD AND LEFT NOSE    . SURGERY LEFT NECK FOR REMOVAL OF METASTATIC LYMPH NODE ( FROM CANCER LEFT FOREHEAD)  AND LEFT NECK DISSECTION-REST OF LYMPH NODES NEGATIVE FOR ANY CANCER    . TEE WITHOUT CARDIOVERSION N/A 01/31/2013   Procedure: TRANSESOPHAGEAL ECHOCARDIOGRAM (TEE);  Surgeon: Sueanne Margarita, MD;  Location: [facility] ENDOSCOPY;  Service: Cardiovascular;  Laterality: N/A;    Prior to Admission medications   Medication Sig Start Date End Date Taking? Authorizing Provider  acetaminophen (TYLENOL) 650 MG CR tablet Take 650 mg by mouth 3 (three) times daily.    [provider]  allopurinol (ZYLOPRIM) 100 MG tablet Take 100 mg by mouth daily.    [provider]  cetirizine (ZYRTEC) 10 MG tablet Take 10 mg by mouth daily.     [provider]  cholecalciferol (VITAMIN D) 1000 UNITS tablet Take 1,000 Units by mouth every morning. Reported on 03/19/2016    [provider]  cyanocobalamin (,VITAMIN B-12,) 1000 MCG/ML injection Inject 1,000 mcg into the muscle every 14 (fourteen) days.     [provider]  furosemide (LASIX) 40 MG tablet Take 40 mg by mouth daily.    [provider]  polyethylene glycol (MIRALAX / GLYCOLAX) packet Take 17 g by mouth daily.    [provider]  potassium chloride (K-DUR) 10 MEQ tablet Take 10 mEq by mouth daily.    [provider]  senna-docusate (SENOKOT-S) 8.6-50 MG tablet Take 2 tablets by mouth daily. 09/10/16   Venia Carbon, MD  sodium chloride (OCEAN) 0.65 % SOLN nasal spray Place 1 spray into both nostrils as needed for congestion.    [provider]  warfarin (COUMADIN) 5 MG tablet TAKE AS DIRECTED BY COUMADIN CLINIC 02/03/16   Sueanne Margarita, MD    Allergies Statins and Latex  Family History  Problem Relation Age of Onset  . Coronary artery disease Father   . Heart disease Father   . Heart attack Father   . Diabetes Mother   . Arthritis Mother   . Congestive Heart  Failure Mother   . Alzheimer's disease Mother   . Alzheimer's disease Sister     Social History Social History   Tobacco Use  . Smoking status: Former Smoker    Packs/day: 1.00    Years: 20.00    Pack years: 20.00    Start date: 11/23/1960  . Smokeless tobacco: Never Used  . Tobacco comment: QUIT SMOKING 1962  Substance Use Topics  . Alcohol use: Yes    Alcohol/week: 7.0 standard drinks    Types: 7 Standard drinks or equivalent per week    Comment: Cocktail or glass of wine with dinner  . Drug use: No    Review of Systems  Unable to be accurately assessed due to patient's baseline disorientation and dementia. ____________________________________________   PHYSICAL EXAM:  VITAL SIGNS: Vitals:   11/09/20 1345 11/09/20 1520  BP: 122/86 (!) 120/94  Pulse: 76 73  Resp: 18 18  Temp:    SpO2: 100% 99%     Constitutional: Alert and pleasantly disoriented.  Hard of hearing.  Follows commands  in all 4 extremities.  No distress. Eyes: Conjunctivae are normal. PERRL. EOMI. Head: Atraumatic. Nose: No congestion/rhinnorhea. Mouth/Throat: Mucous membranes are moist.  Oropharynx non-erythematous. Neck: No stridor. No cervical spine tenderness to palpation. Cardiovascular: Normal rate, regular rhythm. Grossly normal heart sounds.  Good peripheral circulation. Respiratory: Normal respiratory effort.  No retractions. Lungs CTAB. Gastrointestinal: Soft , mild distention.  Suprapubic tenderness without peritoneal features.  Abdomen otherwise benign.  No CVA tenderness bilaterally. Foley catheter placed draining grossly bloody urine, only about 100 cc in the bag at this point shortly after placement Musculoskeletal: No lower extremity tenderness nor edema.  No joint effusions. No signs of acute trauma. Neurologic:  Normal speech and language. No gross focal neurologic deficits are appreciated. No gait instability noted. Skin:  Skin is warm, dry and intact. No rash noted. Psychiatric:  Mood and affect are normal. Speech and behavior are normal.  ____________________________________________   LABS (all labs ordered are listed, but only abnormal results are displayed)  Labs Reviewed  COMPREHENSIVE METABOLIC PANEL - Abnormal; Notable for the following components:      Result Value   BUN 66 (*)    Creatinine, Ser 2.74 (*)    Total Protein 8.4 (*)    GFR, Estimated 21 (*)    All other components within normal limits  CBC - Abnormal; Notable for the following components:   RBC 3.79 (*)    Hemoglobin 11.7 (*)    HCT 36.2 (*)    All other components within normal limits  RESPIRATORY PANEL BY PCR  RESP PANEL BY RT-PCR (FLU A&B, COVID) ARPGX2  LIPASE, BLOOD  URINALYSIS, COMPLETE (UACMP) WITH MICROSCOPIC  PROTIME-INR   ____________________________________________  RADIOLOGY  ED MD interpretation:    Official radiology report(s): CT ABDOMEN PELVIS WO CONTRAST  Result Date: 11/09/2020 CLINICAL DATA:  Abdominal pain and distension, hematuria, supratherapeutic INR EXAM: CT ABDOMEN AND PELVIS WITHOUT CONTRAST TECHNIQUE: Multidetector CT imaging of the abdomen and pelvis was performed following the standard protocol without IV contrast. COMPARISON:  08/08/2013 FINDINGS: Lower chest: The heart is enlarged without pericardial effusion. No acute pleural or parenchymal lung disease. Hepatobiliary: No focal liver abnormality is seen. No gallstones, gallbladder wall thickening, or biliary dilatation. Pancreas: Unremarkable. No pancreatic ductal dilatation or surrounding inflammatory changes. Spleen: Normal in size without focal abnormality. Adrenals/Urinary Tract: There is a 4.8 cm cyst off the lower pole right kidney. Left kidney is unremarkable. No urinary tract calculi or obstructive uropathy. The adrenals are unremarkable. The bladder is decompressed with a Foley catheter. There is high attenuation seen within the left lateral aspect of the bladder. It is difficult to tell whether  this reflects soft tissue or blood products within the bladder lumen. Stomach/Bowel: No bowel obstruction or ileus. Moderate gas and stool throughout the colon. No bowel wall thickening or inflammatory change. Vascular/Lymphatic: At the site of a previously identified thrombosed saccular infrarenal aortic aneurysm there is been significant enlargement of the aorta now measuring 5.3 x 5.3 cm in size, previously having measured 2.9 x 2.5 cm on the 08/08/2013 evaluation. Findings are consistent with an enlarged saccular infrarenal aortic aneurysm. Without IV contrast, evaluation of this aneurysm is limited. There is no periaortic fat stranding to suggest leak or rupture. Contrast CT of the abdomen and pelvis is recommended for further characterization. There is extensive atherosclerosis throughout the aorta and its distal branches. Chronic distension of the left common iliac vein is noted, measuring up to 3.2 cm, previously measuring 2.9 cm. This is of uncertain  significance. No pathologic adenopathy within the abdomen or pelvis. Reproductive: There is marked enlargement of the prostate, measuring 6.3 x 5.3 x 7.0 cm. Other: No free fluid or free gas. No abdominal wall hernia. Postsurgical changes from prior bilateral inguinal hernia repair. Musculoskeletal: No acute or destructive bony lesions. Reconstructed images demonstrate no additional findings. IMPRESSION: 1. Enlarging saccular infrarenal aortic aneurysm, now measuring up to 5.3 x 5.3 cm in size, previously having measured 2.9 x 2.5 cm on the 08/08/2013 evaluation. Without IV contrast, evaluation of this aneurysm is limited. Contrasted CT of the abdomen and pelvis is recommended. 2. High attenuation material within the left lateral aspect of the bladder. It is difficult to tell whether this reflects soft tissue or blood products within the bladder lumen. Cystoscopy may be useful for further evaluation. 3. Marked enlargement of the prostate. 4. Moderate gas and  stool throughout the colon. No obstruction or ileus. 5.  Aortic Atherosclerosis (ICD10-I70.0). Critical Value/emergent results were called by telephone at the time of interpretation on 11/09/2020 at 3:20 pm to provider [facility] , who verbally acknowledged these results. Electronically Signed   By: Randa Ngo M.D.   On: 11/09/2020 15:20    ____________________________________________   PROCEDURES and INTERVENTIONS  Procedure(s) performed (including Critical Care):  Procedures  Medications  lidocaine (XYLOCAINE) 2 % jelly 1 application (1 application Urethral Given 11/09/20 1339)  lactated ringers bolus 1,000 mL (1,000 mLs Intravenous New Bag/Given 11/09/20 1531)    ____________________________________________   MDM / ED COURSE   86 year old male on Coumadin for A. fib and with history of BPH presents with a few days of gross hematuria, penile pain and urinary retention requiring Foley catheter medical admission.  Patient presented tachycardic, but hemodynamically stable, resolving spontaneously.  Exam with some mild suprapubic fullness and tenderness without peritoneal features, and he otherwise looks well without evidence of acute pathology.  Blood work with AKI and INR is pending at the time of this writing.  CT imaging of abdomen pelvis without contrast does not show any ureteral stone or obstruction, and his bladder is draining appropriately with the Foley, but also incidentally notices an enlarged AAA now up to 5.3 cm in diameter. Anticipate medical admission for this patient due to his AKI, urinary retention and likely supratherapeutic INR. Patient signed out to oncoming provider, Dr. Archie Balboa, to facilitate this.   Clinical Course as of 11/09/20 1532  Sat Nov 09, 2020  1517 Call from dr brown, rads, no sbo, blood in bladder. Known thrombosed infra-renal AAA, now increased in size at 5.3cm [DS]  10 Educated granddaughter on the CT results, blood work with AKI.  Awaiting INR  and IV fluids.  Patient signed out to oncoming provider, Dr. Archie Balboa, to follow-up on this INR and to facilitate likely medical admission [DS]    Clinical Course User Index [DS] Vladimir Crofts, MD    ____________________________________________   FINAL CLINICAL IMPRESSION(S) / ED DIAGNOSES  Final diagnoses:  Gross hematuria  Acute urinary retention  Anticoagulated on Coumadin     ED Discharge Orders    None       Bellah Alia Tamala Julian   Note:  This document was prepared using Dragon voice recognition software and may include unintentional dictation errors.   Vladimir Crofts, MD 11/09/20 1538

## 2020-11-09 NOTE — ED Provider Notes (Signed)
Patient INR 2.0. Discussed this with family. Do think patient would benefit from admission for further work up and management of AKI.   Nance Pear, MD 11/09/20 [phone]

## 2020-11-09 NOTE — ED Notes (Signed)
Granddaughter is going to stay with pt tonight due to history of dementia and HOH. Recliner chair brought to family for comfort. Pt denies needs currently, bloody, cloudy urine draining into bedside drainage bag.

## 2020-11-09 NOTE — ED Notes (Signed)
Bladder scan revealed >180, pt peeing at this time and pee is straight blood.

## 2020-11-09 NOTE — ED Notes (Signed)
Granddaughter at bedside. Pt receiving 1st bolus of IV fluids.

## 2020-11-10 ENCOUNTER — Encounter: Payer: Self-pay | Admitting: Internal Medicine

## 2020-11-10 DIAGNOSIS — L8990 Pressure ulcer of unspecified site, unspecified stage: Secondary | ICD-10-CM | POA: Insufficient documentation

## 2020-11-10 DIAGNOSIS — M19041 Primary osteoarthritis, right hand: Secondary | ICD-10-CM | POA: Diagnosis present

## 2020-11-10 DIAGNOSIS — I700 Atherosclerosis of aorta: Secondary | ICD-10-CM | POA: Diagnosis present

## 2020-11-10 DIAGNOSIS — I480 Paroxysmal atrial fibrillation: Secondary | ICD-10-CM | POA: Diagnosis present

## 2020-11-10 DIAGNOSIS — R338 Other retention of urine: Secondary | ICD-10-CM | POA: Diagnosis not present

## 2020-11-10 DIAGNOSIS — N320 Bladder-neck obstruction: Secondary | ICD-10-CM | POA: Diagnosis not present

## 2020-11-10 DIAGNOSIS — R310 Gross hematuria: Secondary | ICD-10-CM | POA: Diagnosis present

## 2020-11-10 DIAGNOSIS — I130 Hypertensive heart and chronic kidney disease with heart failure and stage 1 through stage 4 chronic kidney disease, or unspecified chronic kidney disease: Secondary | ICD-10-CM | POA: Diagnosis present

## 2020-11-10 DIAGNOSIS — I482 Chronic atrial fibrillation, unspecified: Secondary | ICD-10-CM | POA: Diagnosis present

## 2020-11-10 DIAGNOSIS — Z20822 Contact with and (suspected) exposure to covid-19: Secondary | ICD-10-CM | POA: Diagnosis present

## 2020-11-10 DIAGNOSIS — T45515A Adverse effect of anticoagulants, initial encounter: Secondary | ICD-10-CM | POA: Diagnosis present

## 2020-11-10 DIAGNOSIS — H9190 Unspecified hearing loss, unspecified ear: Secondary | ICD-10-CM | POA: Diagnosis present

## 2020-11-10 DIAGNOSIS — F0150 Vascular dementia without behavioral disturbance: Secondary | ICD-10-CM | POA: Diagnosis present

## 2020-11-10 DIAGNOSIS — N1832 Chronic kidney disease, stage 3b: Secondary | ICD-10-CM | POA: Diagnosis present

## 2020-11-10 DIAGNOSIS — D6832 Hemorrhagic disorder due to extrinsic circulating anticoagulants: Secondary | ICD-10-CM | POA: Diagnosis present

## 2020-11-10 DIAGNOSIS — I714 Abdominal aortic aneurysm, without rupture: Secondary | ICD-10-CM | POA: Diagnosis present

## 2020-11-10 DIAGNOSIS — L89311 Pressure ulcer of right buttock, stage 1: Secondary | ICD-10-CM | POA: Diagnosis present

## 2020-11-10 DIAGNOSIS — N401 Enlarged prostate with lower urinary tract symptoms: Secondary | ICD-10-CM | POA: Diagnosis present

## 2020-11-10 DIAGNOSIS — M109 Gout, unspecified: Secondary | ICD-10-CM | POA: Diagnosis present

## 2020-11-10 DIAGNOSIS — I5032 Chronic diastolic (congestive) heart failure: Secondary | ICD-10-CM | POA: Diagnosis present

## 2020-11-10 DIAGNOSIS — G4733 Obstructive sleep apnea (adult) (pediatric): Secondary | ICD-10-CM | POA: Diagnosis present

## 2020-11-10 DIAGNOSIS — N138 Other obstructive and reflux uropathy: Secondary | ICD-10-CM | POA: Diagnosis present

## 2020-11-10 DIAGNOSIS — D631 Anemia in chronic kidney disease: Secondary | ICD-10-CM | POA: Diagnosis present

## 2020-11-10 DIAGNOSIS — N179 Acute kidney failure, unspecified: Secondary | ICD-10-CM | POA: Diagnosis present

## 2020-11-10 DIAGNOSIS — K5900 Constipation, unspecified: Secondary | ICD-10-CM | POA: Diagnosis present

## 2020-11-10 DIAGNOSIS — Z66 Do not resuscitate: Secondary | ICD-10-CM | POA: Diagnosis present

## 2020-11-10 DIAGNOSIS — M19042 Primary osteoarthritis, left hand: Secondary | ICD-10-CM | POA: Diagnosis present

## 2020-11-10 LAB — IRON AND TIBC
Iron: 80 ug/dL (ref 45–182)
Saturation Ratios: 32 % (ref 17.9–39.5)
TIBC: 248 ug/dL — ABNORMAL LOW (ref 250–450)
UIBC: 168 ug/dL

## 2020-11-10 LAB — PHOSPHORUS: Phosphorus: 3.5 mg/dL (ref 2.5–4.6)

## 2020-11-10 LAB — BASIC METABOLIC PANEL
Anion gap: 10 (ref 5–15)
BUN: 57 mg/dL — ABNORMAL HIGH (ref 8–23)
CO2: 27 mmol/L (ref 22–32)
Calcium: 9.2 mg/dL (ref 8.9–10.3)
Chloride: 104 mmol/L (ref 98–111)
Creatinine, Ser: 2.25 mg/dL — ABNORMAL HIGH (ref 0.61–1.24)
GFR, Estimated: 27 mL/min — ABNORMAL LOW (ref >60)
Glucose, Bld: 94 mg/dL (ref 70–99)
Potassium: 3.9 mmol/L (ref 3.5–5.1)
Sodium: 141 mmol/L (ref 135–145)

## 2020-11-10 LAB — CBC
HCT: 29.6 % — ABNORMAL LOW (ref 39.0–52.0)
Hemoglobin: 9.5 g/dL — ABNORMAL LOW (ref 13.0–17.0)
MCH: 30.9 pg (ref 26.0–34.0)
MCHC: 32.1 g/dL (ref 30.0–36.0)
MCV: 96.4 fL (ref 80.0–100.0)
Platelets: 160 10*3/uL (ref 150–400)
RBC: 3.07 MIL/uL — ABNORMAL LOW (ref 4.22–5.81)
RDW: 13.7 % (ref 11.5–15.5)
WBC: 6.6 10*3/uL (ref 4.0–10.5)
nRBC: 0 % (ref 0.0–0.2)

## 2020-11-10 LAB — PROTIME-INR
INR: 1.9 — ABNORMAL HIGH (ref 0.8–1.2)
Prothrombin Time: 20.8 seconds — ABNORMAL HIGH (ref 11.4–15.2)

## 2020-11-10 LAB — RETICULOCYTES
Immature Retic Fract: 2.7 % (ref 2.3–15.9)
RBC.: 3.09 MIL/uL — ABNORMAL LOW (ref 4.22–5.81)
Retic Count, Absolute: 21.9 10*3/uL (ref 19.0–186.0)
Retic Ct Pct: 0.7 % (ref 0.4–3.1)

## 2020-11-10 LAB — FERRITIN: Ferritin: 207 ng/mL (ref 24–336)

## 2020-11-10 LAB — FOLATE: Folate: 11.1 ng/mL (ref >5.9)

## 2020-11-10 LAB — VITAMIN B12: Vitamin B-12: 696 pg/mL (ref 180–914)

## 2020-11-10 MED ORDER — HALOPERIDOL LACTATE 5 MG/ML IJ SOLN
2.5000 mg | Freq: Once | INTRAMUSCULAR | Status: AC
  Administered 2020-11-10: 04:00:00 2.5 mg via INTRAVENOUS

## 2020-11-10 MED ORDER — SODIUM CHLORIDE 0.9 % IV SOLN: INTRAVENOUS | Status: DC

## 2020-11-10 MED ORDER — LORAZEPAM 2 MG/ML IJ SOLN
0.5000 mg | Freq: Once | INTRAMUSCULAR | Status: AC
  Administered 2020-11-10: 23:00:00 0.5 mg via INTRAVENOUS
  Filled 2020-11-10: qty 1

## 2020-11-10 MED ORDER — HALOPERIDOL LACTATE 5 MG/ML IJ SOLN
2.5000 mg | Freq: Once | INTRAMUSCULAR | Status: AC
  Administered 2020-11-10: 21:00:00 2.5 mg via INTRAVENOUS
  Filled 2020-11-10: qty 1

## 2020-11-10 MED ORDER — CHLORHEXIDINE GLUCONATE CLOTH 2 % EX PADS
6.0000 | MEDICATED_PAD | Freq: Every day | CUTANEOUS | Status: DC
  Administered 2020-11-11 (×2): 6 via TOPICAL

## 2020-11-10 MED ORDER — TAMSULOSIN HCL 0.4 MG PO CAPS
0.4000 mg | ORAL_CAPSULE | Freq: Every day | ORAL | Status: DC
  Administered 2020-11-10: 18:00:00 0.4 mg via ORAL
  Filled 2020-11-10: qty 1

## 2020-11-10 MED ORDER — HALOPERIDOL LACTATE 5 MG/ML IJ SOLN
INTRAMUSCULAR | Status: AC
  Filled 2020-11-10: qty 1

## 2020-11-10 NOTE — ED Notes (Signed)
Agitation improved, pt resting, will arouse when touched, daughter at bedside. ivf infusing without s/s of infiltration.

## 2020-11-10 NOTE — Consult Note (Signed)
Urology Consult   Physician requesting consult: Fritzi Mandes, MD  Reason for consult: Gross hematuria  History of Present Illness: [name] is a 86 y.o. male with a history of AAA, chronic atrial fibrillation on warfarin, chronic diastolic heart failure, stage III CKD, mitral valve regurgitation, OSA on CPAP, vascular dementia who presented to the ED on 11/09/2020 with abdominal pain with abdominal distention, hematuria.  He resides in a memory ward at a skilled nursing facility due to his vascular dementia and was brought in by his granddaughter who stated that they were unable to place a Foley catheter at his facility.  A bladder scan in the ED demonstrated only 180 mL.  A Foley catheter was placed with reportedly dark red urine with some clots at the time.  Per the record, he was noted to have a supratherapeutic INR of 9 on 11/07/2017.  History is limited by the patient due to his vascular dementia.  History was obtained by his granddaughter.  She states that she has not heard any significant voiding difficulties per the patient at his nursing facility.  She does not think that he has ever been seen by a urologist prior.  She does say that there is some gross hematuria just prior and after Foley catheter was placed.  Presently, his urine is clear yellow within the Foley catheter.  He denies abdominal or flank pain.  He denies fevers or chills.  Past Medical History:  Diagnosis Date  . AAA (abdominal aortic aneurysm) without rupture ([place]) 03/03/2013   Small, partially thrombosed saccular aneurysm  . Arthritis    HANDS  . Atrial fibrillation ([place]) 02/10/2013  . Cancer ([place])    SKIN CANCERS  . Chronic diastolic congestive heart failure ([place]) 03/20/2013  . Chronic kidney disease, stage III (moderate) ([facility])   . Constipation   . Erectile dysfunction   . Hearing loss    BILATERAL - WEARS HEARING AIDS  . History of torn meniscus of left knee    PAINFUL LEFT KNEE  . Mitral valve prolapse    . Mitral valve regurgitation   . OSA on CPAP   . S/P Maze operation for atrial fibrillation 03/15/2013   Complete bilateral lesion set using cryothermy and bipolar radiofrequency ablation with oversewing of LA appendage via right mini thoracotomy  . Spinal stenosis, thoracic 03/03/2013   T5-6    Past Surgical History:  Procedure Laterality Date  . CARPAL TUNNEL RELEASE     BILATERAL  . [place]  . INTRAOPERATIVE TRANSESOPHAGEAL ECHOCARDIOGRAM Right 03/15/2013   Procedure: INTRAOPERATIVE TRANSESOPHAGEAL ECHOCARDIOGRAM;  Surgeon: Rexene Alberts, MD;  Location: [facility];  Service: Open Heart Surgery;  Laterality: Right;  . MINIMALLY INVASIVE MAZE PROCEDURE Right 03/15/2013   Procedure: MINIMALLY INVASIVE MAZE PROCEDURE;  Surgeon: Rexene Alberts, MD;  Location: [facility];  Service: Open Heart Surgery;  Laterality: Right;  (R) AXILLARY CANNULATION  . MITRAL VALVE REPAIR Right 03/15/2013   Procedure: MINIMALLY INVASIVE MITRAL VALVE REPAIR (MVR);  Surgeon: Rexene Alberts, MD;  Location: [facility];  Service: Open Heart Surgery;  Laterality: Right;  . RIGHT ROTATOR CUFF REPAIR    . SKIN CANCERS REMOVED FROM BOTH LEGS    . SKIN CANCERS REMOVED FROM LEFT FOREHEAD AND LEFT NOSE    . SURGERY LEFT NECK FOR REMOVAL OF METASTATIC LYMPH NODE ( FROM CANCER LEFT FOREHEAD)  AND LEFT NECK DISSECTION-REST OF LYMPH NODES NEGATIVE FOR ANY CANCER    . TEE WITHOUT  CARDIOVERSION N/A 01/31/2013   Procedure: TRANSESOPHAGEAL ECHOCARDIOGRAM (TEE);  Surgeon: Sueanne Margarita, MD;  Location: [facility] ENDOSCOPY;  Service: Cardiovascular;  Laterality: N/A;    Medications:  Home meds:  No current facility-administered medications on file prior to encounter.   Current Outpatient Medications on File Prior to Encounter  Medication Sig Dispense Refill  . acetaminophen (TYLENOL) 650 MG CR tablet Take 650 mg by mouth 3 (three) times daily.    Marland Kitchen allopurinol (ZYLOPRIM) 100 MG tablet Take 100 mg by mouth daily.    .  cetirizine (ZYRTEC) 10 MG tablet Take 10 mg by mouth at bedtime.    . cholecalciferol (VITAMIN D) 1000 UNITS tablet Take 1,000 Units by mouth every morning. Reported on 03/19/2016    . cyanocobalamin (,VITAMIN B-12,) 1000 MCG/ML injection Inject 1,000 mcg into the muscle every 14 (fourteen) days.     . diclofenac Sodium (VOLTAREN) 1 % GEL Apply 4 g topically daily. Apply to right hip area prn for hip pain    . furosemide (LASIX) 40 MG tablet Take 40 mg by mouth daily.    . polyethylene glycol (MIRALAX / GLYCOLAX) packet Take 17 g by mouth daily.    Marland Kitchen senna-docusate (SENOKOT-S) 8.6-50 MG tablet Take 2 tablets by mouth daily. (Patient taking differently: Take 1-2 tablets by mouth daily. One tab in the am  2 tabs in the pm) 60 tablet 11  . spironolactone (ALDACTONE) 25 MG tablet Take 25 mg by mouth 2 (two) times daily.    . traMADol (ULTRAM) 50 MG tablet Take 50 mg by mouth 2 (two) times daily as needed.    . triamcinolone (KENALOG) 0.1 % Apply 1 application topically in the morning and at bedtime.    Marland Kitchen warfarin (COUMADIN) 5 MG tablet TAKE AS DIRECTED BY COUMADIN CLINIC (Patient taking differently: Take 2.5-5 mg by mouth at bedtime. Take 2.5mg  on mon/tue/wed/thur/fri/sat Take 5mg   on sun) 30 tablet 3  . potassium chloride (K-DUR) 10 MEQ tablet Take 10 mEq by mouth daily. (Patient not taking: No sig reported)    . sodium chloride (OCEAN) 0.65 % SOLN nasal spray Place 1 spray into both nostrils as needed for congestion. (Patient not taking: No sig reported)       Scheduled Meds: . allopurinol  100 mg Oral Daily  . diclofenac Sodium  4 g Topical Daily  . loratadine  10 mg Oral Daily  . polyethylene glycol  17 g Oral Daily  . senna-docusate  1-2 tablet Oral Daily   Continuous Infusions: . sodium chloride 75 mL/hr at 11/10/20 1141  . cefTRIAXone (ROCEPHIN)  IV Stopped (11/09/20 2245)  . lactated ringers 100 mL/hr at 11/09/20 1939   PRN Meds:.acetaminophen **OR** acetaminophen, ondansetron **OR**  ondansetron (ZOFRAN) IV, traMADol  Allergies:  Allergies  Allergen Reactions  . Statins     Muscle pains  . Latex Rash    bandaids--no other latex problems    Family History  Problem Relation Age of Onset  . Coronary artery disease Father   . Heart disease Father   . Heart attack Father   . Diabetes Mother   . Arthritis Mother   . Congestive Heart Failure Mother   . Alzheimer's disease Mother   . Alzheimer's disease Sister     Social History:  reports that he has quit smoking. He started smoking about 60 years ago. He has a 20.00 pack-year smoking history. He has never used smokeless tobacco. He reports current alcohol use of about 7.0 standard drinks of  alcohol per week. He reports that he does not use drugs.  ROS: A complete review of systems was performed.  All systems are negative except for pertinent findings as noted.  Physical Exam:  Vital signs in last 24 hours: Temp:  [97.5 F (36.4 C)-98.4 F (36.9 C)] 97.8 F (36.6 C) (12/19 0859) Pulse Rate:  [58-121] 78 (12/19 0859) Resp:  [16-24] 18 (12/19 0859) BP: (98-156)/(56-98) 125/65 (12/19 0859) SpO2:  [92 %-100 %] 93 % (12/19 0859) Weight:  [65.8 kg] 65.8 kg (12/18 1234) Constitutional:  Alert and oriented, No acute distress Cardiovascular: Regular rate and rhythm Respiratory: Normal respiratory effort, Lungs clear bilaterally GI: Abdomen is soft, nontender, nondistended, no abdominal masses Genitourinary: No CVAT. Normal male phallus, testes are descended bilaterally and non-tender and without masses, scrotum is normal in appearance without lesions or masses, perineum is normal on inspection. Rectal: Normal sphincter tone, no rectal masses, prostate is non tender and without nodularity. Prostate size is estimated to be at least 80 cc, no nodules Neurologic: Grossly intact, no focal deficits Psychiatric: Normal mood and affect  Laboratory Data:  Recent Labs    11/09/20 1237 11/10/20 0221  WBC 7.4 6.6  HGB  11.7* 9.5*  HCT 36.2* 29.6*  PLT 200 160    Recent Labs    11/09/20 1237 11/10/20 0221  NA 141 141  K 4.6 3.9  CL 100 104  GLUCOSE 85 94  BUN 66* 57*  CALCIUM 9.9 9.2  CREATININE 2.74* 2.25*     Results for orders placed or performed during the hospital encounter of 11/09/20 (from the past 24 hour(s))  Lipase, blood     Status: None   Collection Time: 11/09/20 12:37 PM  Result Value Ref Range   Lipase 40 11 - 51 U/L  Comprehensive metabolic panel     Status: Abnormal   Collection Time: 11/09/20 12:37 PM  Result Value Ref Range   Sodium 141 135 - 145 mmol/L   Potassium 4.6 3.5 - 5.1 mmol/L   Chloride 100 98 - 111 mmol/L   CO2 29 22 - 32 mmol/L   Glucose, Bld 85 70 - 99 mg/dL   BUN 66 (H) 8 - 23 mg/dL   Creatinine, Ser 2.74 (H) 0.61 - 1.24 mg/dL   Calcium 9.9 8.9 - 10.3 mg/dL   Total Protein 8.4 (H) 6.5 - 8.1 g/dL   Albumin 4.3 3.5 - 5.0 g/dL   AST 18 15 - 41 U/L   ALT 17 0 - 44 U/L   Alkaline Phosphatase 71 38 - 126 U/L   Total Bilirubin 1.0 0.3 - 1.2 mg/dL   GFR, Estimated 21 (L) >60 mL/min   Anion gap 12 5 - 15  CBC     Status: Abnormal   Collection Time: 11/09/20 12:37 PM  Result Value Ref Range   WBC 7.4 4.0 - 10.5 K/uL   RBC 3.79 (L) 4.22 - 5.81 MIL/uL   Hemoglobin 11.7 (L) 13.0 - 17.0 g/dL   HCT 36.2 (L) 39.0 - 52.0 %   MCV 95.5 80.0 - 100.0 fL   MCH 30.9 26.0 - 34.0 pg   MCHC 32.3 30.0 - 36.0 g/dL   RDW 13.6 11.5 - 15.5 %   Platelets 200 150 - 400 K/uL   nRBC 0.0 0.0 - 0.2 %  Urinalysis, Complete w Microscopic     Status: Abnormal   Collection Time: 11/09/20  3:32 PM  Result Value Ref Range   Color, Urine RED (A) YELLOW  APPearance CLOUDY (A) CLEAR   Specific Gravity, Urine 1.014 1.005 - 1.030   pH  5.0 - 8.0    TEST NOT REPORTED DUE TO COLOR INTERFERENCE OF URINE PIGMENT   Glucose, UA (A) NEGATIVE mg/dL    TEST NOT REPORTED DUE TO COLOR INTERFERENCE OF URINE PIGMENT   Hgb urine dipstick (A) NEGATIVE    TEST NOT REPORTED DUE TO COLOR  INTERFERENCE OF URINE PIGMENT   Bilirubin Urine (A) NEGATIVE    TEST NOT REPORTED DUE TO COLOR INTERFERENCE OF URINE PIGMENT   Ketones, ur (A) NEGATIVE mg/dL    TEST NOT REPORTED DUE TO COLOR INTERFERENCE OF URINE PIGMENT   Protein, ur (A) NEGATIVE mg/dL    TEST NOT REPORTED DUE TO COLOR INTERFERENCE OF URINE PIGMENT   Nitrite (A) NEGATIVE    TEST NOT REPORTED DUE TO COLOR INTERFERENCE OF URINE PIGMENT   Leukocytes,Ua (A) NEGATIVE    TEST NOT REPORTED DUE TO COLOR INTERFERENCE OF URINE PIGMENT   RBC / HPF >50 (H) 0 - 5 RBC/hpf   WBC, UA >50 (H) 0 - 5 WBC/hpf   Bacteria, UA NONE SEEN NONE SEEN   Squamous Epithelial / LPF NONE SEEN 0 - 5  Protime-INR     Status: Abnormal   Collection Time: 11/09/20  3:32 PM  Result Value Ref Range   Prothrombin Time 22.3 (H) 11.4 - 15.2 seconds   INR 2.0 (H) 0.8 - 1.2  Resp Panel by RT-PCR (Flu A&B, Covid)     Status: None   Collection Time: 11/09/20  3:32 PM   Specimen: Nasopharyngeal(NP) swabs in vial transport medium  Result Value Ref Range   SARS Coronavirus 2 by RT PCR NEGATIVE NEGATIVE   Influenza A by PCR NEGATIVE NEGATIVE   Influenza B by PCR NEGATIVE NEGATIVE  Protime-INR     Status: Abnormal   Collection Time: 11/10/20  2:21 AM  Result Value Ref Range   Prothrombin Time 20.8 (H) 11.4 - 15.2 seconds   INR 1.9 (H) 0.8 - 1.2  Basic metabolic panel     Status: Abnormal   Collection Time: 11/10/20  2:21 AM  Result Value Ref Range   Sodium 141 135 - 145 mmol/L   Potassium 3.9 3.5 - 5.1 mmol/L   Chloride 104 98 - 111 mmol/L   CO2 27 22 - 32 mmol/L   Glucose, Bld 94 70 - 99 mg/dL   BUN 57 (H) 8 - 23 mg/dL   Creatinine, Ser 2.25 (H) 0.61 - 1.24 mg/dL   Calcium 9.2 8.9 - 10.3 mg/dL   GFR, Estimated 27 (L) >60 mL/min   Anion gap 10 5 - 15  CBC     Status: Abnormal   Collection Time: 11/10/20  2:21 AM  Result Value Ref Range   WBC 6.6 4.0 - 10.5 K/uL   RBC 3.07 (L) 4.22 - 5.81 MIL/uL   Hemoglobin 9.5 (L) 13.0 - 17.0 g/dL   HCT 29.6 (L)  39.0 - 52.0 %   MCV 96.4 80.0 - 100.0 fL   MCH 30.9 26.0 - 34.0 pg   MCHC 32.1 30.0 - 36.0 g/dL   RDW 13.7 11.5 - 15.5 %   Platelets 160 150 - 400 K/uL   nRBC 0.0 0.0 - 0.2 %   Recent Results (from the past 240 hour(s))  Resp Panel by RT-PCR (Flu A&B, Covid)     Status: None   Collection Time: 11/09/20  3:32 PM   Specimen: Nasopharyngeal(NP) swabs in vial transport  medium  Result Value Ref Range Status   SARS Coronavirus 2 by RT PCR NEGATIVE NEGATIVE Final    Comment: (NOTE) SARS-CoV-2 target nucleic acids are NOT DETECTED.  The SARS-CoV-2 RNA is generally detectable in upper respiratory specimens during the acute phase of infection. The lowest concentration of SARS-CoV-2 viral copies this assay can detect is 138 copies/mL. A negative result does not preclude SARS-Cov-2 infection and should not be used as the sole basis for treatment or other patient management decisions. A negative result may occur with  improper specimen collection/handling, submission of specimen other than nasopharyngeal swab, presence of viral mutation(s) within the areas targeted by this assay, and inadequate number of viral copies(<138 copies/mL). A negative result must be combined with clinical observations, patient history, and epidemiological information. The expected result is Negative.  Fact Sheet for Patients:  EntrepreneurPulse.com.au  Fact Sheet for Healthcare Providers:  IncredibleEmployment.be  This test is no t yet approved or cleared by the [place] FDA and  has been authorized for detection and/or diagnosis of SARS-CoV-2 by FDA under an Emergency Use Authorization (EUA). This EUA will remain  in effect (meaning this test can be used) for the duration of the COVID-19 declaration under Section 564(b)(1) of the Act, 21 U.S.C.section 360bbb-3(b)(1), unless the authorization is terminated  or revoked sooner.       Influenza A by PCR NEGATIVE NEGATIVE  Final   Influenza B by PCR NEGATIVE NEGATIVE Final    Comment: (NOTE) The Xpert Xpress SARS-CoV-2/FLU/RSV plus assay is intended as an aid in the diagnosis of influenza from Nasopharyngeal swab specimens and should not be used as a sole basis for treatment. Nasal washings and aspirates are unacceptable for Xpert Xpress SARS-CoV-2/FLU/RSV testing.  Fact Sheet for Patients: EntrepreneurPulse.com.au  Fact Sheet for Healthcare Providers: IncredibleEmployment.be  This test is not yet approved or cleared by the [place] FDA and has been authorized for detection and/or diagnosis of SARS-CoV-2 by FDA under an Emergency Use Authorization (EUA). This EUA will remain in effect (meaning this test can be used) for the duration of the COVID-19 declaration under Section 564(b)(1) of the Act, 21 U.S.C. section 360bbb-3(b)(1), unless the authorization is terminated or revoked.  Performed at [facility], [address] [place],  [address]     Renal Function: Recent Labs    11/09/20 1237 11/10/20 0221  CREATININE 2.74* 2.25*   Estimated Creatinine Clearance: 20.7 mL/min (A) (by C-G formula based on SCr of 2.25 mg/dL (H)).  Radiologic Imaging: CT ABDOMEN PELVIS WO CONTRAST  Result Date: 11/09/2020 CLINICAL DATA:  Abdominal pain and distension, hematuria, supratherapeutic INR EXAM: CT ABDOMEN AND PELVIS WITHOUT CONTRAST TECHNIQUE: Multidetector CT imaging of the abdomen and pelvis was performed following the standard protocol without IV contrast. COMPARISON:  08/08/2013 FINDINGS: Lower chest: The heart is enlarged without pericardial effusion. No acute pleural or parenchymal lung disease. Hepatobiliary: No focal liver abnormality is seen. No gallstones, gallbladder wall thickening, or biliary dilatation. Pancreas: Unremarkable. No pancreatic ductal dilatation or surrounding inflammatory changes. Spleen: Normal in size without focal  abnormality. Adrenals/Urinary Tract: There is a 4.8 cm cyst off the lower pole right kidney. Left kidney is unremarkable. No urinary tract calculi or obstructive uropathy. The adrenals are unremarkable. The bladder is decompressed with a Foley catheter. There is high attenuation seen within the left lateral aspect of the bladder. It is difficult to tell whether this reflects soft tissue or blood products within the bladder lumen. Stomach/Bowel: No bowel obstruction or ileus.  Moderate gas and stool throughout the colon. No bowel wall thickening or inflammatory change. Vascular/Lymphatic: At the site of a previously identified thrombosed saccular infrarenal aortic aneurysm there is been significant enlargement of the aorta now measuring 5.3 x 5.3 cm in size, previously having measured 2.9 x 2.5 cm on the 08/08/2013 evaluation. Findings are consistent with an enlarged saccular infrarenal aortic aneurysm. Without IV contrast, evaluation of this aneurysm is limited. There is no periaortic fat stranding to suggest leak or rupture. Contrast CT of the abdomen and pelvis is recommended for further characterization. There is extensive atherosclerosis throughout the aorta and its distal branches. Chronic distension of the left common iliac vein is noted, measuring up to 3.2 cm, previously measuring 2.9 cm. This is of uncertain significance. No pathologic adenopathy within the abdomen or pelvis. Reproductive: There is marked enlargement of the prostate, measuring 6.3 x 5.3 x 7.0 cm. Other: No free fluid or free gas. No abdominal wall hernia. Postsurgical changes from prior bilateral inguinal hernia repair. Musculoskeletal: No acute or destructive bony lesions. Reconstructed images demonstrate no additional findings. IMPRESSION: 1. Enlarging saccular infrarenal aortic aneurysm, now measuring up to 5.3 x 5.3 cm in size, previously having measured 2.9 x 2.5 cm on the 08/08/2013 evaluation. Without IV contrast, evaluation of this  aneurysm is limited. Contrasted CT of the abdomen and pelvis is recommended. 2. High attenuation material within the left lateral aspect of the bladder. It is difficult to tell whether this reflects soft tissue or blood products within the bladder lumen. Cystoscopy may be useful for further evaluation. 3. Marked enlargement of the prostate. 4. Moderate gas and stool throughout the colon. No obstruction or ileus. 5.  Aortic Atherosclerosis (ICD10-I70.0). Critical Value/emergent results were called by telephone at the time of interpretation on 11/09/2020 at 3:20 pm to provider [facility] , who verbally acknowledged these results. Electronically Signed   By: Randa Ngo M.D.   On: 11/09/2020 15:20    I independently reviewed the above imaging studies.  Impression/Recommendation 1. Gross hematuria: Noted on 11/09/2020 around the time Foley catheter was placed in the ED.  This is presently resolved at time of consultation (mid day 11/10/2020) with clear yellow urine in the Foley catheter tube.  In the setting of warfarin for atrial fibrillation with a supratherapeutic INR of 9 on 11/07/2020.  Etiology of hematuria possibly related to enlarged prostate versus bladder.  CT A/P without contrast on 11/09/2020 with high attenuating material within the bladder difficult to elucidate if this is blood products or bladder mass. 2. BPH: CT A/P 11/09/2020 with prostate measuring 6.3 x 5.3 x 7.0 cm equating to 121 g prostate 3. AKI on CKD 3: Creatinine 2.25 on 11/10/2020 from baseline of 1.8.  Possibly related to bladder outlet obstruction. 4. Saccular infrarenal aortic aneurysm: Measures 5.3 x 5.3 on CT A/P 11/09/2020 5. Vascular dementia: Currently DNR 6. PMH of AAA, chronic atrial fibrillation on warfarin, chronic diastolic heart failure, stage III CKD, mitral valve regurgitation, OSA on CPAP, vascular dementia   -Interrogated Foley catheter.  Urine is currently clear yellow.  I irrigated through the 17 [place]  Foley with no difficulty.  No clot evident.  At this time, his urine is yellow, I do not think that we need to upsize the catheter for fear of causing worsening hematuria with his enlarged prostate.  I did discuss with the nursing staff and coached them how to upsize the catheter to 24 [place] three-way and start CBI if this is needed later  today. -Primary holding Coumadin given recent supratherapeutic INR -Will need cystoscopy and CT hematuria protocol as outpatient to evaluate for possible bladder mass -Started tamsulosin 0.4mg   Matt R. Shuree Brossart MD 11/10/2020, 12:26 PM  [facility]  Pager: [phone]   CC: Fritzi Mandes, MD

## 2020-11-10 NOTE — ED Notes (Signed)
Pt sleeping. resps unlabored.

## 2020-11-10 NOTE — ED Notes (Signed)
Pt up again, picking at monitoring equipment and iv line/foley. Daughter concerned pt is becoming agitated again. Daughter informed that will need to be careful with haldol administration. Daughter verbalizes understanding.

## 2020-11-10 NOTE — ED Notes (Signed)
Pt sleeping. 

## 2020-11-10 NOTE — ED Notes (Signed)
Pt sleeping, one finger width obtainable beneath safety mitts. Family at bedside, urine continues to drain into foley, bloody.

## 2020-11-10 NOTE — ED Notes (Signed)
Pt awake, agitated, attempting to remove foley, iv catheter.

## 2020-11-10 NOTE — ED Notes (Signed)
Report to jennifer, rn.  

## 2020-11-10 NOTE — Consult Note (Signed)
[name] MRN: [id number] DOB/AGE: Jun 27, 1929 86 y.o. Primary Care Physician:Letvak, Theophilus Kinds, MD Admit date: 11/09/2020 Chief Complaint:  Chief Complaint  Patient presents with  . Abdominal Pain  . Hematuria    Patient has severe dementia.  History has been provided by patient's granddaughter and medical records.  HPI Patient is a 86 year old Caucasian male with a past medical history of aortic abdominal aneurysm, osteoarthritis, atrial fibrillation, chronic diastolic CHF, CKD stage III, obstructive sleep apnea who was brought to the ER with chief complaint of abdominal pain  History of present illness dates back to this past thursdayo when patient family noticed that he had hematuria.  Patient family was trying to get thisworked up as an outpatient, patient had appointment with the urologist this Thursday but but yesterday patient was brought by her granddaughter from [facility] as patient started having abdominal pain. Patient complains of distended abdomen Patient was unable to urinate. Patient  family stated that they/the staff at the nursing home did try to catheterize the patient but were not able to. Patient was thereafter brought to the ER. Upon evaluation in the ER patient was found to have gross hematuria as well as post void residual of around 200 mL Patient also had a CT scan done CT scan showed enlarging infrarenal aortic aneurysm now at 5.3 cm, it also showed mild enlargement of prostate.   Patient was seen today on the first floor Patient is lying comfortably in the bed Patient voices no specific concerns. Patient granddaughter was present in the room. Patient granddaughter major concern continues to be hematuria     Past Medical History:  Diagnosis Date  . AAA (abdominal aortic aneurysm) without rupture ([place]) 03/03/2013   Small, partially thrombosed saccular aneurysm  . Arthritis    HANDS  . Atrial fibrillation ([place]) 02/10/2013  . Cancer ([place])    SKIN  CANCERS  . Chronic diastolic congestive heart failure ([place]) 03/20/2013  . Chronic kidney disease, stage III (moderate) (HCC)   . Constipation   . Erectile dysfunction   . Hearing loss    BILATERAL - WEARS HEARING AIDS  . History of torn meniscus of left knee    PAINFUL LEFT KNEE  . Mitral valve prolapse   . Mitral valve regurgitation   . OSA on CPAP   . S/P Maze operation for atrial fibrillation 03/15/2013   Complete bilateral lesion set using cryothermy and bipolar radiofrequency ablation with oversewing of LA appendage via right mini thoracotomy  . Spinal stenosis, thoracic 03/03/2013   T5-6        Family History  Problem Relation Age of Onset  . Coronary artery disease Father   . Heart disease Father   . Heart attack Father   . Diabetes Mother   . Arthritis Mother   . Congestive Heart Failure Mother   . Alzheimer's disease Mother   . Alzheimer's disease Sister     Social History:  reports that he has quit smoking. He started smoking about 60 years ago. He has a 20.00 pack-year smoking history. He has never used smokeless tobacco. He reports current alcohol use of about 7.0 standard drinks of alcohol per week. He reports that he does not use drugs.   Allergies:  Allergies  Allergen Reactions  . Statins     Muscle pains  . Latex Rash    bandaids--no other latex problems    Medications Prior to Admission  Medication Sig Dispense Refill  . acetaminophen (TYLENOL) 650 MG CR tablet  Take 650 mg by mouth 3 (three) times daily.    Marland Kitchen allopurinol (ZYLOPRIM) 100 MG tablet Take 100 mg by mouth daily.    . cetirizine (ZYRTEC) 10 MG tablet Take 10 mg by mouth at bedtime.    . cholecalciferol (VITAMIN D) 1000 UNITS tablet Take 1,000 Units by mouth every morning. Reported on 03/19/2016    . cyanocobalamin (,VITAMIN B-12,) 1000 MCG/ML injection Inject 1,000 mcg into the muscle every 14 (fourteen) days.     . diclofenac Sodium (VOLTAREN) 1 % GEL Apply 4 g topically daily. Apply to  right hip area prn for hip pain    . furosemide (LASIX) 40 MG tablet Take 40 mg by mouth daily.    . polyethylene glycol (MIRALAX / GLYCOLAX) packet Take 17 g by mouth daily.    Marland Kitchen senna-docusate (SENOKOT-S) 8.6-50 MG tablet Take 2 tablets by mouth daily. (Patient taking differently: Take 1-2 tablets by mouth daily. One tab in the am  2 tabs in the pm) 60 tablet 11  . spironolactone (ALDACTONE) 25 MG tablet Take 25 mg by mouth 2 (two) times daily.    . traMADol (ULTRAM) 50 MG tablet Take 50 mg by mouth 2 (two) times daily as needed.    . triamcinolone (KENALOG) 0.1 % Apply 1 application topically in the morning and at bedtime.    Marland Kitchen warfarin (COUMADIN) 5 MG tablet TAKE AS DIRECTED BY COUMADIN CLINIC (Patient taking differently: Take 2.5-5 mg by mouth at bedtime. Take 2.5mg  on mon/tue/wed/thur/fri/sat Take 5mg   on sun) 30 tablet 3  . potassium chloride (K-DUR) 10 MEQ tablet Take 10 mEq by mouth daily. (Patient not taking: No sig reported)    . sodium chloride (OCEAN) 0.65 % SOLN nasal spray Place 1 spray into both nostrils as needed for congestion. (Patient not taking: No sig reported)         ROS: Unable to get any data  . allopurinol  100 mg Oral Daily  . diclofenac Sodium  4 g Topical Daily  . loratadine  10 mg Oral Daily  . polyethylene glycol  17 g Oral Daily  . senna-docusate  1-2 tablet Oral Daily  . tamsulosin  0.4 mg Oral QPC supper          Physical Exam: Vital signs in last 24 hours: Temp:  [97.8 F (36.6 C)-98.4 F (36.9 C)] 97.8 F (36.6 C) (12/19 0859) Pulse Rate:  [58-121] 78 (12/19 0859) Resp:  [16-24] 18 (12/19 0859) BP: (98-156)/(56-98) 125/65 (12/19 0859) SpO2:  [92 %-100 %] 93 % (12/19 0859) Weight change:     Intake/Output from previous day: 12/18 0701 - 12/19 0700 In: 1100 [IV Piggyback:1100] Out: 900 [Urine:900] Total I/O In: 1510.4 [I.V.:1510.4] Out: -    Physical Exam: General- pt is awake, but does not follow any commands HEENT-head is  atraumatic normocephalic Resp- No acute REsp distress, CTA B/L NO Rhonchi CVS- S1S2 regular in rate and rhythm GIT- BS+, soft, NT, ND EXT- NO LE Edema, Cyanosis CNS- CN 2-12 grossly intact. Moving all 4 extremities GU-Foley in situ    Lab Results: CBC Recent Labs    11/09/20 1237 11/10/20 0221  WBC 7.4 6.6  HGB 11.7* 9.5*  HCT 36.2* 29.6*  PLT 200 160    BMET Recent Labs    11/09/20 1237 11/10/20 0221  NA 141 141  K 4.6 3.9  CL 100 104  CO2 29 27  GLUCOSE 85 94  BUN 66* 57*  CREATININE 2.74* 2.25*  CALCIUM 9.9  9.2    Creatinine trend 2021 2.7==>2.2 1.8 baseline  2018 1.4--1.6 2016 1.6--1.7 1215 1.4-1.5 2014 1.5--1.9-patient had AKI 20131.3   MICRO Recent Results (from the past 240 hour(s))  Resp Panel by RT-PCR (Flu A&B, Covid)     Status: None   Collection Time: 11/09/20  3:32 PM   Specimen: Nasopharyngeal(NP) swabs in vial transport medium  Result Value Ref Range Status   SARS Coronavirus 2 by RT PCR NEGATIVE NEGATIVE Final    Comment: (NOTE) SARS-CoV-2 target nucleic acids are NOT DETECTED.  The SARS-CoV-2 RNA is generally detectable in upper respiratory specimens during the acute phase of infection. The lowest concentration of SARS-CoV-2 viral copies this assay can detect is 138 copies/mL. A negative result does not preclude SARS-Cov-2 infection and should not be used as the sole basis for treatment or other patient management decisions. A negative result may occur with  improper specimen collection/handling, submission of specimen other than nasopharyngeal swab, presence of viral mutation(s) within the areas targeted by this assay, and inadequate number of viral copies(<138 copies/mL). A negative result must be combined with clinical observations, patient history, and epidemiological information. The expected result is Negative.  Fact Sheet for Patients:  EntrepreneurPulse.com.au  Fact Sheet for Healthcare Providers:   IncredibleEmployment.be  This test is no t yet approved or cleared by the [place] FDA and  has been authorized for detection and/or diagnosis of SARS-CoV-2 by FDA under an Emergency Use Authorization (EUA). This EUA will remain  in effect (meaning this test can be used) for the duration of the COVID-19 declaration under Section 564(b)(1) of the Act, 21 U.S.C.section 360bbb-3(b)(1), unless the authorization is terminated  or revoked sooner.       Influenza A by PCR NEGATIVE NEGATIVE Final   Influenza B by PCR NEGATIVE NEGATIVE Final    Comment: (NOTE) The Xpert Xpress SARS-CoV-2/FLU/RSV plus assay is intended as an aid in the diagnosis of influenza from Nasopharyngeal swab specimens and should not be used as a sole basis for treatment. Nasal washings and aspirates are unacceptable for Xpert Xpress SARS-CoV-2/FLU/RSV testing.  Fact Sheet for Patients: EntrepreneurPulse.com.au  Fact Sheet for Healthcare Providers: IncredibleEmployment.be  This test is not yet approved or cleared by the [place] FDA and has been authorized for detection and/or diagnosis of SARS-CoV-2 by FDA under an Emergency Use Authorization (EUA). This EUA will remain in effect (meaning this test can be used) for the duration of the COVID-19 declaration under Section 564(b)(1) of the Act, 21 U.S.C. section 360bbb-3(b)(1), unless the authorization is terminated or revoked.  Performed at [facility], [address] [place], [place] [address]       Lab Results  Component Value Date   CALCIUM 9.2 11/10/2020   CAION 1.19 03/16/2013      Impression: 1)Renal  AKI secondary to obstruction AKI on CKD Patient has CKD stage IIIb Patient has CKD stage III since 2013 Patient has CKD stage IIIb secondary to obstructive issues Possible contribution from hypertension/age associated decline   AKI is now  improving   2)HTN   Blood pressure is at goal   3)Anemia anemia of blood loss/anemia of chronic disease  Patient hemoglobin is trending down  4) secondary hyperparathyroidism CKD Mineral-Bone Disorder We'll check patient phosphorus levels   5) atrial fibrillation Patient INR currently is at  1.9-- 2.0 Anticoagulation is currently on hold  6) electrolytes   normokalemic NOrmonatremic   7)Acid base Co2 at goal  8) Abdominal aortic aneurysm Patient aneurysm  size increasing  CT scan report showed : At the site of a previously identified thrombosed saccular infrarenal aortic aneurysm there is been significant enlargement of the aorta now measuring 5.3 x 5.3 cm in size, previously having measured 2.9 x 2.5 cm on the 08/08/2013 evaluation. Findings are consistent with an enlarged saccular infrarenal aortic aneurysm. Without IV contrast, evaluation of this aneurysm is limited. There is no periaortic fat stranding to suggest leak or rupture. Contrast CT of the abdomen and pelvis is recommended for further characterization.   Primary team is following   9) dementia Patient has history of baseline dementia Primary team is following   Plan:  For AKI Patient AKI is improving-after Foley placement Patient creatinine is trending down  For secondary hyperparathyroidism We will ask for phosphorus levels We will follow Chem-7  For anemia We will ask for anemia work-up  I did discuss patient kidney related issues with patient's granddaughter.     Whitnie Deleon s Theador Hawthorne 11/10/2020, 4:01 PM

## 2020-11-10 NOTE — Progress Notes (Signed)
[facility] NAME: [name]    MR#:  [id number]  DATE OF BIRTH:  December 31, 1928  SUBJECTIVE:  Pt hard on hearing and had dementia Grand-dter in the room  Foley--pink urine--no clotts REVIEW OF SYSTEMS:   Review of Systems  Unable to perform ROS: Dementia   Tolerating Diet: Tolerating PT:   DRUG ALLERGIES:   Allergies  Allergen Reactions  . Statins     Muscle pains  . Latex Rash    bandaids--no other latex problems    VITALS:  Blood pressure 125/65, pulse 78, temperature 97.8 F (36.6 C), resp. rate 18, height 5\' 8"  (1.727 m), weight 65.8 kg, SpO2 93 %.  PHYSICAL EXAMINATION:   Physical Exam  GENERAL:  86 y.o.-year-old patient lying in the bed with no acute distress.  LUNGS: Normal breath sounds bilaterally, no wheezing, rales, rhonchi. No use of accessory muscles of respiration.  CARDIOVASCULAR: S1, S2 normal. No murmurs, rubs, or gallops.  ABDOMEN: Soft, nontender, nondistended. Bowel sounds present. No organomegaly or mass.  EXTREMITIES: chronic venous stasis changes NEUROLOGIC:groslly non-focal PSYCHIATRIC:  patient is alert --has dementia at baseline SKIN: Pressure Injury 11/10/20 Buttocks Right Stage 1 -  Intact skin with non-blanchable redness of a localized area usually over a bony prominence. (Active)  11/10/20 1048  Location: Buttocks  Location Orientation: Right  Staging: Stage 1 -  Intact skin with non-blanchable redness of a localized area usually over a bony prominence.  Wound Description (Comments):   Present on Admission: Yes       LABORATORY PANEL:  CBC Recent Labs  Lab 11/10/20 0221  WBC 6.6  HGB 9.5*  HCT 29.6*  PLT 160    Chemistries  Recent Labs  Lab 11/09/20 1237 11/10/20 0221  NA 141 141  K 4.6 3.9  CL 100 104  CO2 29 27  GLUCOSE 85 94  BUN 66* 57*  CREATININE 2.74* 2.25*  CALCIUM 9.9 9.2  AST 18  --   ALT 17  --   ALKPHOS 71  --   BILITOT 1.0  --    Cardiac  Enzymes No results for input(s): TROPONINI in the last 168 hours. RADIOLOGY:  CT ABDOMEN PELVIS WO CONTRAST  Result Date: 11/09/2020 CLINICAL DATA:  Abdominal pain and distension, hematuria, supratherapeutic INR EXAM: CT ABDOMEN AND PELVIS WITHOUT CONTRAST TECHNIQUE: Multidetector CT imaging of the abdomen and pelvis was performed following the standard protocol without IV contrast. COMPARISON:  08/08/2013 FINDINGS: Lower chest: The heart is enlarged without pericardial effusion. No acute pleural or parenchymal lung disease. Hepatobiliary: No focal liver abnormality is seen. No gallstones, gallbladder wall thickening, or biliary dilatation. Pancreas: Unremarkable. No pancreatic ductal dilatation or surrounding inflammatory changes. Spleen: Normal in size without focal abnormality. Adrenals/Urinary Tract: There is a 4.8 cm cyst off the lower pole right kidney. Left kidney is unremarkable. No urinary tract calculi or obstructive uropathy. The adrenals are unremarkable. The bladder is decompressed with a Foley catheter. There is high attenuation seen within the left lateral aspect of the bladder. It is difficult to tell whether this reflects soft tissue or blood products within the bladder lumen. Stomach/Bowel: No bowel obstruction or ileus. Moderate gas and stool throughout the colon. No bowel wall thickening or inflammatory change. Vascular/Lymphatic: At the site of a previously identified thrombosed saccular infrarenal aortic aneurysm there is been significant enlargement of the aorta now measuring 5.3 x 5.3 cm in size, previously having measured 2.9 x 2.5 cm on  the 08/08/2013 evaluation. Findings are consistent with an enlarged saccular infrarenal aortic aneurysm. Without IV contrast, evaluation of this aneurysm is limited. There is no periaortic fat stranding to suggest leak or rupture. Contrast CT of the abdomen and pelvis is recommended for further characterization. There is extensive atherosclerosis  throughout the aorta and its distal branches. Chronic distension of the left common iliac vein is noted, measuring up to 3.2 cm, previously measuring 2.9 cm. This is of uncertain significance. No pathologic adenopathy within the abdomen or pelvis. Reproductive: There is marked enlargement of the prostate, measuring 6.3 x 5.3 x 7.0 cm. Other: No free fluid or free gas. No abdominal wall hernia. Postsurgical changes from prior bilateral inguinal hernia repair. Musculoskeletal: No acute or destructive bony lesions. Reconstructed images demonstrate no additional findings. IMPRESSION: 1. Enlarging saccular infrarenal aortic aneurysm, now measuring up to 5.3 x 5.3 cm in size, previously having measured 2.9 x 2.5 cm on the 08/08/2013 evaluation. Without IV contrast, evaluation of this aneurysm is limited. Contrasted CT of the abdomen and pelvis is recommended. 2. High attenuation material within the left lateral aspect of the bladder. It is difficult to tell whether this reflects soft tissue or blood products within the bladder lumen. Cystoscopy may be useful for further evaluation. 3. Marked enlargement of the prostate. 4. Moderate gas and stool throughout the colon. No obstruction or ileus. 5.  Aortic Atherosclerosis (ICD10-I70.0). Critical Value/emergent results were called by telephone at the time of interpretation on 11/09/2020 at 3:20 pm to provider [facility] , who verbally acknowledged these results. Electronically Signed   By: Randa Ngo M.D.   On: 11/09/2020 15:20   ASSESSMENT AND PLAN:   [name] is a 86 y.o. male with medical history significant of AAA, osteoarthritis of the hands, chronic atrial fibrillation, unspecified skin cancer, chronic diastolic heart failure, stage III CKD, constipation, rectal dysfunction, hearing loss, left knee meniscus tear, mitral valve prolapse/mitral valve regurgitation, OSA on CPAP, maze procedure for atrial fibrillation, thoracic spinal stenosis at T5-T6 who is  coming to the emergency department due to abdominal pain with abdominal distention, hematuria with penile pain and some urinary retention.  Hematuria/Abdominal distention/Difficult urination --Consult urology DR Juliane Lack Gay--recommends cont to monitor, as needed CBI, IVF --Warfarin has been on hold. --empiric IV abxs --CT abd/pelvis :Enlarging saccular infrarenal aortic aneurysm, now measuring up to 5.3 x 5.3 cm in size, previously having measured 2.9 x 2.5 cm on the 08/08/2013 evaluation. Without IV contrast, evaluation of this aneurysm is limited. Contrasted CT of the abdomen and pelvis is recommended. 2. High attenuation material within the left lateral aspect of the bladder. It is difficult to tell whether this reflects soft tissue or blood products within the bladder lumen. Cystoscopy may be useful for further evaluation. 3. Marked enlargement of the prostate. 4. Moderate gas and stool throughout the colon. No obstruction or ileus. 5.  Aortic Atherosclerosis (ICD10-I70.0  hgb 11.7--9.5 (getting IVF also)     PAF (paroxysmal atrial fibrillation) (HCC) -CHA?DS?-VASc Score of at least 5. --Warfarin has been held.    AAA (abdominal aortic aneurysm) without rupture (HCC)--Known h/o Atherosclerosis of Aorta -Family not inclined to surgical rx -They would like to speak to vascular surgery though--will have vascular take a look at CT abd/pelvis results tomorrow  OSA on CPAP Continue CPAP at bedtime.  Acute on Chronic kidney disease, stage III b(moderate) (HCC) Monitor renal function electrolytes. -came in with creat 2.74--2.2 --baseline creat 1.6--1.8  Constipation Continue MiraLAX. Continue stool softeners.  Osteoarthrosis involving multiple sites Analgesics as needed.  Gout --Continue colchicine 0.6 mg p.o. daily.  Anemia of chronic renal failure, stage 3 (moderate) (HCC) Monitor H&H.   Vascular dementia without behavioral disturbance ([place]) Supportive  care.  Pressure Injury 11/10/20 Buttocks Right Stage 1 -  Intact skin with non-blanchable redness of a localized area usually over a bony prominence. (Active)  11/10/20 1048  Location: Buttocks  Location Orientation: Right  Staging: Stage 1 -  Intact skin with non-blanchable redness of a localized area usually over a bony prominence.  Wound Description (Comments):   Present on Admission: Yes         DVT prophylaxis:      SCD. Code Status:  DNR prior to arrival Family Communication: His granddaughter Estill Bamberg was present in the ED room. Disposition Plan: To [facility] when ready  CODE STATUS:  DVT Prophylaxis :  INpatient Dispo: The patient is from: ALF              Anticipated d/c is to: ALF              Anticipated d/c date is: 2 days              Patient currently is not medically stable to d/c. W/u for hematuria and renal fialure       TOTAL TIME TAKING CARE OF THIS PATIENT: 25 minutes.  >50% time spent on counselling and coordination of care  Note: This dictation was prepared with Dragon dictation along with smaller phrase technology. Any transcriptional errors that result from this process are unintentional.  Fritzi Mandes M.D    [facility]   CC: Primary care physician; Venia Carbon, MDPatient ID: [name], male   DOB: 02/24/1929, 86 y.o.   MRN: [id number]

## 2020-11-10 NOTE — ED Notes (Signed)
Pt resting, no longer agitated.

## 2020-11-11 DIAGNOSIS — I714 Abdominal aortic aneurysm, without rupture: Secondary | ICD-10-CM

## 2020-11-11 DIAGNOSIS — R338 Other retention of urine: Secondary | ICD-10-CM

## 2020-11-11 DIAGNOSIS — R310 Gross hematuria: Secondary | ICD-10-CM

## 2020-11-11 LAB — BASIC METABOLIC PANEL
Anion gap: 11 (ref 5–15)
BUN: 40 mg/dL — ABNORMAL HIGH (ref 8–23)
CO2: 25 mmol/L (ref 22–32)
Calcium: 9.2 mg/dL (ref 8.9–10.3)
Chloride: 105 mmol/L (ref 98–111)
Creatinine, Ser: 1.85 mg/dL — ABNORMAL HIGH (ref 0.61–1.24)
GFR, Estimated: 34 mL/min — ABNORMAL LOW (ref >60)
Glucose, Bld: 101 mg/dL — ABNORMAL HIGH (ref 70–99)
Potassium: 4.5 mmol/L (ref 3.5–5.1)
Sodium: 141 mmol/L (ref 135–145)

## 2020-11-11 LAB — HEPATITIS B SURFACE ANTIGEN: Hepatitis B Surface Ag: NONREACTIVE

## 2020-11-11 LAB — PROTIME-INR
INR: 1.5 — ABNORMAL HIGH (ref 0.8–1.2)
Prothrombin Time: 17.3 seconds — ABNORMAL HIGH (ref 11.4–15.2)

## 2020-11-11 MED ORDER — TAMSULOSIN HCL 0.4 MG PO CAPS
0.4000 mg | ORAL_CAPSULE | Freq: Every day | ORAL | 0 refills | Status: AC
Start: 2020-11-11 — End: ?

## 2020-11-11 MED ORDER — CEPHALEXIN 500 MG PO CAPS: 500.0000 mg | ORAL_CAPSULE | Freq: Three times a day (TID) | ORAL | Status: DC

## 2020-11-11 MED ORDER — CEPHALEXIN 500 MG PO CAPS: 500.0000 mg | ORAL_CAPSULE | Freq: Three times a day (TID) | ORAL | 0 refills | Status: AC

## 2020-11-11 NOTE — NC FL2 (Signed)
[place] LEVEL OF CARE SCREENING TOOL     IDENTIFICATION  Patient Name: [name] Birthdate: Mar 31, 1929 Sex: male Admission Date (Current Location): 11/09/2020  [place] and [place] Number:  Engineering geologist and Address:  [facility], [address] [place], [place] [address]      Provider Number: [id number]  Attending Physician Name and Address:  Fritzi Mandes, MD  Relative Name and Phone Number:  Governor Specking (Daughter)   [phone] [facility] Phone)    Current Level of Care: Hospital Recommended Level of Care: Assisted Living [facility] Care Prior Approval Number:    Date Approved/Denied:   PASRR Number:    Discharge Plan: Other (Comment)    Current Diagnoses: Patient Active Problem List   Diagnosis Date Noted  . Acute urinary retention   . Pressure injury of skin 11/10/2020  . AKI (acute kidney injury) ([place]) 11/09/2020  . Hematuria 11/09/2020  . Vascular dementia without behavioral disturbance ([place]) 02/08/2020  . Thrombosis of saphenous vein, right 01/12/2019  . Anemia of chronic renal failure, stage 3 (moderate) ([place]) 10/14/2018  . Gout 10/06/2017  . Osteoarthrosis involving multiple sites 09/10/2016  . Constipation 06/11/2016  . Chronic kidney disease, stage III (moderate) (HCC)   . S/P MVR (mitral valve repair) 03/28/2015  . Encounter for therapeutic drug monitoring 12/26/2013  . OSA on CPAP   . Chronic diastolic congestive heart failure ([place]) 03/20/2013  . S/P Maze operation for atrial fibrillation 03/15/2013  . MR (mitral regurgitation) 03/03/2013  . PAF (paroxysmal atrial fibrillation) ([place]) 03/03/2013  . Spinal stenosis, thoracic 03/03/2013  . AAA (abdominal aortic aneurysm) without rupture ([place]) 03/03/2013  . Mitral valve prolapse, nonrheumatic 01/24/2013    Orientation RESPIRATION BLADDER Height & Weight     Self  Normal Indwelling catheter Weight: 145 lb (65.8 kg) Height:  5\' 8"  (172.7 cm)   BEHAVIORAL SYMPTOMS/MOOD NEUROLOGICAL BOWEL NUTRITION STATUS        Diet (heart)  AMBULATORY STATUS COMMUNICATION OF NEEDS Skin     Verbally Bruising (stage 1 R buttocks)                       Personal Care Assistance Level of Assistance  Bathing,Feeding,Dressing Bathing Assistance: Limited assistance         Functional Limitations Info             SPECIAL CARE FACTORS FREQUENCY                       Contractures      Additional Factors Info  Code Status,Allergies Code Status Info: DNR Allergies Info: statins, latex           Current Medications (11/11/2020):  This is the current hospital active medication list Current Facility-Administered Medications  Medication Dose Route Frequency Provider Last Rate Last Admin  . acetaminophen (TYLENOL) tablet 650 mg  650 mg Oral Q6H PRN Reubin Milan, MD       Or  . acetaminophen (TYLENOL) suppository 650 mg  650 mg Rectal Q6H PRN Reubin Milan, MD      . allopurinol (ZYLOPRIM) tablet 100 mg  100 mg Oral Daily Reubin Milan, MD   100 mg at 11/10/20 1021  . cephALEXin (KEFLEX) capsule 500 mg  500 mg Oral TID Fritzi Mandes, MD      . Chlorhexidine Gluconate Cloth 2 % PADS 6 each  6 each Topical Daily Fritzi Mandes, MD   6 each  at 11/11/20 0903  . diclofenac Sodium (VOLTAREN) 1 % topical gel 4 g  4 g Topical Daily Reubin Milan, MD      . loratadine [facility]) tablet 10 mg  10 mg Oral Daily Reubin Milan, MD      . ondansetron [facility]) tablet 4 mg  4 mg Oral Q6H PRN Reubin Milan, MD       Or  . ondansetron [facility]) injection 4 mg  4 mg Intravenous Q6H PRN Reubin Milan, MD      . polyethylene glycol [facility] Flor[facility] Raveling) packet 17 g  17 g Oral Daily Reubin Milan, MD      . senna-docusate (Senokot-S) tablet 1-2 tablet  1-2 tablet Oral Daily Reubin Milan, MD      . tamsulosin [facility]) capsule 0.4 mg  0.4 mg Oral QPC supper Janith Lima, MD   0.4 mg at 11/10/20 1735   . traMADol (ULTRAM) tablet 50 mg  50 mg Oral BID PRN Reubin Milan, MD   50 mg at 11/10/20 2054     Discharge Medications: Please see discharge summary for a list of discharge medications.  Relevant Imaging Results:  Relevant Lab Results:   Additional Information SS #: [id number]  [place], LCSW

## 2020-11-11 NOTE — Discharge Summary (Signed)
[name] at [place] NAME: [name]    MR#:  [id number]  [place] OF BIRTH:  1929-10-24  DATE OF ADMISSION:  11/09/2020 ADMITTING PHYSICIAN: Reubin Milan, MD  DATE OF DISCHARGE: 11/11/2020  PRIMARY CARE PHYSICIAN: Venia Carbon, MD    ADMISSION DIAGNOSIS:  Gross hematuria [R31.0] AKI (acute kidney injury) ([place]) [N17.9] Acute urinary retention [R33.8] Anticoagulated on Coumadin [Z79.01]  DISCHARGE DIAGNOSIS:  Hematuria with Acute urinary retention--now has Foley catheter  SECONDARY DIAGNOSIS:   Past Medical History:  Diagnosis Date  . AAA (abdominal aortic aneurysm) without rupture ([place]) 03/03/2013   Small, partially thrombosed saccular aneurysm  . Arthritis    HANDS  . Atrial fibrillation ([place]) 02/10/2013  . Cancer ([place])    SKIN CANCERS  . Chronic diastolic congestive heart failure ([place]) 03/20/2013  . Chronic kidney disease, stage III (moderate) (HCC)   . Constipation   . Erectile dysfunction   . Hearing loss    BILATERAL - WEARS HEARING AIDS  . History of torn meniscus of left knee    PAINFUL LEFT KNEE  . Mitral valve prolapse   . Mitral valve regurgitation   . OSA on CPAP   . S/P Maze operation for atrial fibrillation 03/15/2013   Complete bilateral lesion set using cryothermy and bipolar radiofrequency ablation with oversewing of LA appendage via right mini thoracotomy  . Spinal stenosis, thoracic 03/03/2013   T5-6    HOSPITAL COURSE:   [name] a 86 y.o.malewith medical history significant ofAAA, osteoarthritis of the hands, chronic atrial fibrillation, unspecified skin cancer,chronic diastolic heart failure, stage III CKD, constipation, rectal dysfunction, hearing loss, left knee meniscus tear, mitral valve prolapse/mitral valve regurgitation, OSA on CPAP, maze procedure for atrial fibrillation, thoracic spinal stenosis at T5-T6 who is coming to the emergency department due to abdominal pain with  abdominal distention, hematuria with penile painand some urinary retention.  Hematuria/Abdominal distention/acute urinary retention --Consult urology DR Loletha Carrow appreciated. Urology had discussion with family daughter in the room. They are not keen on getting any aggressive workup done. Will continue Foley catheter for five days. Hold warfarin for one week. PCP to determine whether to resume or not after discussion with family. Continue Flomax. --Warfarin has been on hold. --empiric IV abxs-- change to PO Keflex for total five days --CT abd/pelvis :Enlarging saccular infrarenal aortic aneurysm, now measuring up to 5.3 x 5.3 cm in size, previously having measured 2.9 x 2.5 cm on the 08/08/2013 evaluation. Without IV contrast, evaluation of this aneurysm is limited. Contrasted CT of the abdomen and pelvis is recommended. 2. High attenuation material within the left lateral aspect of the bladder. It is difficult to tell whether this reflects soft tissue or blood products within the bladder lumen. Cystoscopy may be useful for further evaluation. 3. Marked enlargement of the prostate. 4. Moderate gas and stool throughout the colon. No obstruction or ileus. 5. Aortic Atherosclerosis (ICD10-I70.0  hgb 11.7--9.5 Foley bag no blood clots. Urine clearing up.   PAF (paroxysmal atrial fibrillation) (HCC) -CHA?DS?-VASc Scoreof at least 5. --Warfarin has been held.  AAA (abdominal aortic aneurysm) without rupture (HCC)--Known h/o Atherosclerosis of Aorta -Family not inclined to surgical rx -initially family wanted to discuss CT abdomen results with vascular surgery. -- Spoke with daughter [name] in the room. Family has changed mind and at this point decline vascular consult. Dr. Delana Meyer has been informed.  OSA on CPAP Continue CPAP at bedtime.  Acute on Chronic kidney disease,  stage III b(moderate) (HCC) Monitor renal function electrolytes. -came in with creat  2.74--2.2 --baseline creat 1.6--1.8 -- I have held Lasix for now given elevated creatinine. Patient is euvolemic. Will defer to PCP if Lasix needs to be resumed on the road.   Constipation Continue MiraLAX. Continue stool softeners.  Osteoarthrosis involving multiple sites Analgesics as needed.  Gout --Continue colchicine 0.6 mg p.o. daily.  Anemia of chronic renal failure, stage 3 (moderate) (HCC) Monitor H&H.  Vascular dementia without behavioral disturbance ([place]) Supportive care.  Pressure Injury 11/10/20 Buttocks Right Stage 1 -  Intact skin with non-blanchable redness of a localized area usually over a bony prominence. (Active)  11/10/20 1048  Location: Buttocks  Location Orientation: Right  Staging: Stage 1 -  Intact skin with non-blanchable redness of a localized area usually over a bony prominence.  Wound Description (Comments):   Present on Admission: Yes     DVT prophylaxis:SCD. Code Status:DNR prior to arrival Family Communication:spoke with [name] daughter in the room  disposition Plan:To [facility] with palliative care today  CODE STATUS:  DVT Prophylaxis :  INpatient Dispo: The patient is from: ALF  Anticipated d/c is to: ALF  Anticipated d/c date is: today discussed with patient's daughter Vaughan Basta in the room. She has discussed with other siblings and family. At this point given his advanced dementia and behavior issues family feels he will be better off in his familiar environment which is [facility]. The request patient be discharged back to [facility]. Outpatient palliative care referral made. TOC aware.  Patient will discharged with Foley catheter which will be removed in five days. Will follow-up with urology as needed. Warfarin is held at discharge.  CONSULTS OBTAINED:  Treatment Team:  Janith Lima, MD Liana Gerold, MD [place], Dolores Lory, MD  DRUG ALLERGIES:   Allergies  Allergen  Reactions  . Statins     Muscle pains  . Latex Rash    bandaids--no other latex problems    DISCHARGE MEDICATIONS:   Allergies as of 11/11/2020      Reactions   Statins    Muscle pains   Latex Rash   bandaids--no other latex problems      Medication List    STOP taking these medications   furosemide 40 MG tablet Commonly known as: LASIX   potassium chloride 10 MEQ tablet Commonly known as: KLOR-CON   sodium chloride 0.65 % Soln nasal spray Commonly known as: OCEAN   warfarin 5 MG tablet Commonly known as: COUMADIN     TAKE these medications   acetaminophen 650 MG CR tablet Commonly known as: TYLENOL Take 650 mg by mouth 3 (three) times daily.   allopurinol 100 MG tablet Commonly known as: ZYLOPRIM Take 100 mg by mouth daily.   cephALEXin 500 MG capsule Commonly known as: KEFLEX Take 1 capsule (500 mg total) by mouth 3 (three) times daily for 5 days.   cetirizine 10 MG tablet Commonly known as: ZYRTEC Take 10 mg by mouth at bedtime.   cholecalciferol 1000 units tablet Commonly known as: VITAMIN D Take 1,000 Units by mouth every morning. Reported on 03/19/2016   cyanocobalamin 1000 MCG/ML injection Commonly known as: (VITAMIN B-12) Inject 1,000 mcg into the muscle every 14 (fourteen) days.   diclofenac Sodium 1 % Gel Commonly known as: VOLTAREN Apply 4 g topically daily. Apply to right hip area prn for hip pain   polyethylene glycol 17 g packet Commonly known as: MIRALAX / GLYCOLAX Take 17 g  by mouth daily.   senna-docusate 8.6-50 MG tablet Commonly known as: Senokot-S Take 2 tablets by mouth daily. What changed:   how much to take  additional instructions   spironolactone 25 MG tablet Commonly known as: ALDACTONE Take 25 mg by mouth 2 (two) times daily.   tamsulosin 0.4 MG Caps capsule Commonly known as: FLOMAX Take 1 capsule (0.4 mg total) by mouth daily after supper.   traMADol 50 MG tablet Commonly known as: ULTRAM Take 50 mg by  mouth 2 (two) times daily as needed.   triamcinolone 0.1 % Commonly known as: KENALOG Apply 1 application topically in the morning and at bedtime.            Discharge Care Instructions  (From admission, onward)         Start     Ordered   11/11/20 0000  Discharge wound care:       Comments: Foam pad   11/11/20 1200          If you experience worsening of your admission symptoms, develop shortness of breath, life threatening emergency, suicidal or homicidal thoughts you must seek medical attention immediately by calling 911 or calling your MD immediately  if symptoms less severe.  You Must read complete instructions/literature along with all the possible adverse reactions/side effects for all the Medicines you take and that have been prescribed to you. Take any new Medicines after you have completely understood and accept all the possible adverse reactions/side effects.   Please note  You were cared for by a hospitalist during your hospital stay. If you have any questions about your discharge medications or the care you received while you were in the hospital after you are discharged, you can call the unit and asked to speak with the hospitalist on call if the hospitalist that took care of you is not available. Once you are discharged, your primary care physician will handle any further medical issues. Please note that NO REFILLS for any discharge medications will be authorized once you are discharged, as it is imperative that you return to your primary care physician (or establish a relationship with a primary care physician if you do not have one) for your aftercare needs so that they can reassess your need for medications and monitor your lab values. Today   SUBJECTIVE   Resting currently. Required Ativan and Haldol last night due to agitation and restlessness. Daughter Vaughan Basta in the room.  VITAL SIGNS:  Blood pressure 115/63, pulse 87, temperature 99 F (37.2 C),  temperature source Oral, resp. rate 16, height 5\' 8"  (1.727 m), weight 65.8 kg, SpO2 91 %.  I/O:    Intake/Output Summary (Last 24 hours) at 11/11/2020 1210 Last data filed at 11/10/2020 1709 Gross per 24 hour  Intake 1510.4 ml  Output 650 ml  Net 860.4 ml    PHYSICAL EXAMINATION:   GENERAL:  86 y.o.-year-old patient lying in the bed with no acute distress.  LUNGS: Normal breath sounds bilaterally, no wheezing, rales, rhonchi. No use of accessory muscles of respiration.  CARDIOVASCULAR: S1, S2 normal. No murmurs, rubs, or gallops.  ABDOMEN: Soft, nontender, nondistended. Bowel sounds present. No organomegaly or mass.  EXTREMITIES: chronic venous stasis changes NEUROLOGIC:groslly non-focal PSYCHIATRIC:  patient is alert --has dementia at baseline SKIN: Pressure Injury 11/10/20 Buttocks Right Stage 1 -  Intact skin with non-blanchable redness of a localized area usually over a bony prominence. (Active)  11/10/20 1048  Location: Buttocks  Location Orientation: Right  Staging:  Stage 1 -  Intact skin with non-blanchable redness of a localized area usually over a bony prominence.  Wound Description (Comments):   Present on Admission: Yes     DATA REVIEW:   CBC  Recent Labs  Lab 11/10/20 0221  WBC 6.6  HGB 9.5*  HCT 29.6*  PLT 160    Chemistries  Recent Labs  Lab 11/09/20 1237 11/10/20 0221 11/11/20 0523  NA 141   < > 141  K 4.6   < > 4.5  CL 100   < > 105  CO2 29   < > 25  GLUCOSE 85   < > 101*  BUN 66*   < > 40*  CREATININE 2.74*   < > 1.85*  CALCIUM 9.9   < > 9.2  AST 18  --   --   ALT 17  --   --   ALKPHOS 71  --   --   BILITOT 1.0  --   --    < > = values in this interval not displayed.    Microbiology Results   Recent Results (from the past 240 hour(s))  Resp Panel by RT-PCR (Flu A&B, Covid)     Status: None   Collection Time: 11/09/20  3:32 PM   Specimen: Nasopharyngeal(NP) swabs in vial transport medium  Result Value Ref Range Status   SARS  Coronavirus 2 by RT PCR NEGATIVE NEGATIVE Final    Comment: (NOTE) SARS-CoV-2 target nucleic acids are NOT DETECTED.  The SARS-CoV-2 RNA is generally detectable in upper respiratory specimens during the acute phase of infection. The lowest concentration of SARS-CoV-2 viral copies this assay can detect is 138 copies/mL. A negative result does not preclude SARS-Cov-2 infection and should not be used as the sole basis for treatment or other patient management decisions. A negative result may occur with  improper specimen collection/handling, submission of specimen other than nasopharyngeal swab, presence of viral mutation(s) within the areas targeted by this assay, and inadequate number of viral copies(<138 copies/mL). A negative result must be combined with clinical observations, patient history, and epidemiological information. The expected result is Negative.  Fact Sheet for Patients:  EntrepreneurPulse.com.au  Fact Sheet for Healthcare Providers:  IncredibleEmployment.be  This test is no t yet approved or cleared by the [place] FDA and  has been authorized for detection and/or diagnosis of SARS-CoV-2 by FDA under an Emergency Use Authorization (EUA). This EUA will remain  in effect (meaning this test can be used) for the duration of the COVID-19 declaration under Section 564(b)(1) of the Act, 21 U.S.C.section 360bbb-3(b)(1), unless the authorization is terminated  or revoked sooner.       Influenza A by PCR NEGATIVE NEGATIVE Final   Influenza B by PCR NEGATIVE NEGATIVE Final    Comment: (NOTE) The Xpert Xpress SARS-CoV-2/FLU/RSV plus assay is intended as an aid in the diagnosis of influenza from Nasopharyngeal swab specimens and should not be used as a sole basis for treatment. Nasal washings and aspirates are unacceptable for Xpert Xpress SARS-CoV-2/FLU/RSV testing.  Fact Sheet for  Patients: EntrepreneurPulse.com.au  Fact Sheet for Healthcare Providers: IncredibleEmployment.be  This test is not yet approved or cleared by the [place] FDA and has been authorized for detection and/or diagnosis of SARS-CoV-2 by FDA under an Emergency Use Authorization (EUA). This EUA will remain in effect (meaning this test can be used) for the duration of the COVID-19 declaration under Section 564(b)(1) of the Act, 21 U.S.C. section 360bbb-3(b)(1), unless the authorization  is terminated or revoked.  Performed at [facility], [address] [place], [place] [address]     RADIOLOGY:  CT ABDOMEN PELVIS WO CONTRAST  Result Date: 11/09/2020 CLINICAL DATA:  Abdominal pain and distension, hematuria, supratherapeutic INR EXAM: CT ABDOMEN AND PELVIS WITHOUT CONTRAST TECHNIQUE: Multidetector CT imaging of the abdomen and pelvis was performed following the standard protocol without IV contrast. COMPARISON:  08/08/2013 FINDINGS: Lower chest: The heart is enlarged without pericardial effusion. No acute pleural or parenchymal lung disease. Hepatobiliary: No focal liver abnormality is seen. No gallstones, gallbladder wall thickening, or biliary dilatation. Pancreas: Unremarkable. No pancreatic ductal dilatation or surrounding inflammatory changes. Spleen: Normal in size without focal abnormality. Adrenals/Urinary Tract: There is a 4.8 cm cyst off the lower pole right kidney. Left kidney is unremarkable. No urinary tract calculi or obstructive uropathy. The adrenals are unremarkable. The bladder is decompressed with a Foley catheter. There is high attenuation seen within the left lateral aspect of the bladder. It is difficult to tell whether this reflects soft tissue or blood products within the bladder lumen. Stomach/Bowel: No bowel obstruction or ileus. Moderate gas and stool throughout the colon. No bowel wall thickening or inflammatory change.  Vascular/Lymphatic: At the site of a previously identified thrombosed saccular infrarenal aortic aneurysm there is been significant enlargement of the aorta now measuring 5.3 x 5.3 cm in size, previously having measured 2.9 x 2.5 cm on the 08/08/2013 evaluation. Findings are consistent with an enlarged saccular infrarenal aortic aneurysm. Without IV contrast, evaluation of this aneurysm is limited. There is no periaortic fat stranding to suggest leak or rupture. Contrast CT of the abdomen and pelvis is recommended for further characterization. There is extensive atherosclerosis throughout the aorta and its distal branches. Chronic distension of the left common iliac vein is noted, measuring up to 3.2 cm, previously measuring 2.9 cm. This is of uncertain significance. No pathologic adenopathy within the abdomen or pelvis. Reproductive: There is marked enlargement of the prostate, measuring 6.3 x 5.3 x 7.0 cm. Other: No free fluid or free gas. No abdominal wall hernia. Postsurgical changes from prior bilateral inguinal hernia repair. Musculoskeletal: No acute or destructive bony lesions. Reconstructed images demonstrate no additional findings. IMPRESSION: 1. Enlarging saccular infrarenal aortic aneurysm, now measuring up to 5.3 x 5.3 cm in size, previously having measured 2.9 x 2.5 cm on the 08/08/2013 evaluation. Without IV contrast, evaluation of this aneurysm is limited. Contrasted CT of the abdomen and pelvis is recommended. 2. High attenuation material within the left lateral aspect of the bladder. It is difficult to tell whether this reflects soft tissue or blood products within the bladder lumen. Cystoscopy may be useful for further evaluation. 3. Marked enlargement of the prostate. 4. Moderate gas and stool throughout the colon. No obstruction or ileus. 5.  Aortic Atherosclerosis (ICD10-I70.0). Critical Value/emergent results were called by telephone at the time of interpretation on 11/09/2020 at 3:20 pm to  provider [facility] , who verbally acknowledged these results. Electronically Signed   By: Randa Ngo M.D.   On: 11/09/2020 15:20     CODE STATUS:     Code Status Orders  (From admission, onward)         Start     Ordered   11/09/20 2050  Do not attempt resuscitation (DNR)  Continuous       Question Answer Comment  In the event of cardiac or respiratory ARREST Do not call a "code blue"   In the event of cardiac  or respiratory ARREST Do not perform Intubation, CPR, defibrillation or ACLS   In the event of cardiac or respiratory ARREST Use medication by any route, position, wound care, and other measures to relive pain and suffering. May use oxygen, suction and manual treatment of airway obstruction as needed for comfort.      11/09/20 2049        Code Status History    Date Active Date Inactive Code Status Order ID Comments User Context   11/09/2020 1388 11/09/2020 2049 Full Code [id number]  Reubin Milan, MD ED   07/11/2017 2201 07/13/2017 1304 Full Code [id number]  Harvie Bridge, DO ED   03/17/2013 0910 03/21/2013 1451 Full Code [id number]  Rexene Alberts, MD Inpatient   03/15/2013 1650 03/17/2013 0910 Full Code [id number]  Rexene Alberts, MD Inpatient   Advance Care Planning Activity       TOTAL TIME TAKING CARE OF THIS PATIENT: *35* minutes.    Fritzi Mandes M.D  [facility]    CC: Primary care physician; Venia Carbon, MD

## 2020-11-11 NOTE — TOC Initial Note (Addendum)
Transition of Care [facility]) - Initial/Assessment Note    Patient Details  Name: [name] MRN: [id number] Date of Birth: [birth year]  Transition of Care [facility]) CM/SW Contact:    Magnus Ivan, LCSW Phone Number: 11/11/2020, 10:16 AM  Clinical Narrative:                Patient disoriented per chart review. Spoke to patient's daughter Sula Soda via phone. Sula Soda reported patient is a resident at [facility]F in their [facility] where he has been for a few years. CSW confirmed this with Seth Bake at [facility]. PCP is Dr. Silvio Pate. Patient has  RW. Family or [facility] provide transport to appointments and Sula Soda reported family can transport patient back to ALF at DC. Seth Bake at [facility] reported it is fine if family transports back to ALF at discharge. Sula Soda asked about plan for DC as she reported she would like patient to go back to his familiar environment, she understands this is based on when MD feels patient is medically ready to DC. Updated MD and [facility] and will continue to follow.   1:35- Confirmed with Raquel Sarna at [facility] that patient can return to their Memory Care ALF today. Updated patient's daughter Sula Soda. Informed that MD made referral for [facility] to follow for OP Palliative services at the ALF. Asked RN to call report and complete rapid COVID test prior to DC. Daughter to transport back to ALF.   2:00- Informed by LPN that patient may need EMS transport if he does not wake up more before DC. CSW completed EMS paperwork and placed in [place] in case EMS is needed this afternoon at time of DC.   Expected Discharge Plan: Memory Care Barriers to Discharge: Continued Medical Work up   Patient Goals and CMS Choice Patient states their goals for this hospitalization and ongoing recovery are:: return to [place] CMS Medicare.gov Compare Post Acute Care list provided to:: Patient Represenative (must comment) Choice offered to / list presented to : Adult  Children  Expected Discharge Plan and Services Expected Discharge Plan: Memory Care       Living arrangements for the past 2 months: [place]                                      Prior Living Arrangements/Services Living arrangements for the past 2 months: [place] Lives with:: Facility Resident Patient language and need for interpreter reviewed:: Yes Do you feel safe going back to the place where you live?: Yes      Need for Family Participation in Patient Care: Yes (Comment) Care giver support system in place?: Yes (comment) Current home services: DME Criminal Activity/Legal Involvement Pertinent to Current Situation/Hospitalization: No - Comment as needed  Activities of Daily Living Home Assistive Devices/Equipment: Gilford Rile (specify type) ADL Screening (condition at time of admission) Patient's cognitive ability adequate to safely complete daily activities?: No Is the patient deaf or have difficulty hearing?: Yes Does the patient have difficulty seeing, even when wearing glasses/contacts?: Yes Does the patient have difficulty concentrating, remembering, or making decisions?: Yes Patient able to express need for assistance with ADLs?: No Does the patient have difficulty dressing or bathing?: Yes Independently performs ADLs?: No Does the patient have difficulty walking or climbing stairs?: Yes Weakness of Legs: Both Weakness of Arms/Hands: None  Permission Sought/Granted Permission sought to share information  with : Customer service manager (by daughter) Permission granted to share information with : Yes, Verbal Permission Granted     Permission granted to share info w AGENCY: [facility]        Emotional Assessment       Orientation: : Fluctuating Orientation (Suspected and/or reported Sundowners) Alcohol / Substance Use: Not Applicable Psych Involvement: No (comment)  Admission diagnosis:  Gross hematuria [R31.0] AKI  (acute kidney injury) ([place]) [N17.9] Acute urinary retention [R33.8] Anticoagulated on Coumadin [Z79.01] Patient Active Problem List   Diagnosis Date Noted  . Pressure injury of skin 11/10/2020  . AKI (acute kidney injury) ([place]) 11/09/2020  . Hematuria 11/09/2020  . Vascular dementia without behavioral disturbance ([place]) 02/08/2020  . Thrombosis of saphenous vein, right 01/12/2019  . Anemia of chronic renal failure, stage 3 (moderate) ([place]) 10/14/2018  . Gout 10/06/2017  . Osteoarthrosis involving multiple sites 09/10/2016  . Constipation 06/11/2016  . Chronic kidney disease, stage III (moderate) (HCC)   . S/P MVR (mitral valve repair) 03/28/2015  . Encounter for therapeutic drug monitoring 12/26/2013  . OSA on CPAP   . Chronic diastolic congestive heart failure ([place]) 03/20/2013  . S/P [place] operation for atrial fibrillation 03/15/2013  . MR (mitral regurgitation) 03/03/2013  . PAF (paroxysmal atrial fibrillation) ([place]) 03/03/2013  . Spinal stenosis, thoracic 03/03/2013  . AAA (abdominal aortic aneurysm) without rupture ([place]) 03/03/2013  . Mitral valve prolapse, nonrheumatic 01/24/2013   PCP:  Venia Carbon, MD Pharmacy:   [place], [place] [address] Phone: [phone] Fax: [place], [place] [address] [place] [address] Phone: [phone] Fax: [phone]     Social Determinants of Health (SDOH) Interventions    Readmission Risk Interventions No flowsheet data found.

## 2020-11-11 NOTE — Discharge Instructions (Signed)
Continue Foley catheter for five days. Foley catheter protocol. Patient should follow-up  urology as outpatient outpatient palliative care to follow at [facility]

## 2020-11-11 NOTE — Progress Notes (Signed)
Patient stable and ready for discharge going to [facility]. Patient's daughters at bedside. Patient's IV removed and foley intact and leg bag in place. Writer called report to [facility] and spoke with Paulla Dolly and verbalized understanding of report provided and had no questions. Patient's daughters packed patient's belongings. Patient being transported [facility].

## 2020-11-11 NOTE — Progress Notes (Signed)
[facility] Liaison Note:  New referral for [facility] outpatient Palliative to follow post discharge at [facility] received from attending physician Dr. Posey Pronto. Patient information given to referral. Thank you. Flo Shanks BSN, RN, [place] [phone]

## 2020-11-11 NOTE — Progress Notes (Signed)
Urology Inpatient Progress Note  Subjective: No acute events overnight. Creatinine down today, 1.85.  INR down today, 1.5. Catheter in place draining clear, yellow urine. He is accompanied today by his 2 daughters at the bedside, who serve as his shared HCPOA's.  They have questions about the futility of proceeding with recommended outpatient CT urogram and cystoscopy for further evaluation of a possible bladder mass given his multiple medical comorbidities.  They state they are hesitant to pursue surgery to treat any found bladder mass due to his health, with particular concerns for his known >5cm AAA.  Additionally, patient currently resides in the memory care unit at [facility], which provides assisted living services.  They are concerned that he may require nursing care at this point.  Anti-infectives: Anti-infectives (From admission, onward)   Start     Dose/Rate Route Frequency Ordered Stop   11/09/20 2130  cefTRIAXone (ROCEPHIN) 1 g in sodium chloride 0.9 % 100 mL IVPB        1 g 200 mL/hr over 30 Minutes Intravenous Every 24 hours 11/09/20 2125        Current Facility-Administered Medications  Medication Dose Route Frequency Provider Last Rate Last Admin  . 0.9 %  sodium chloride infusion   Intravenous Continuous Fritzi Mandes, MD 75 mL/hr at 11/10/20 2256 New Bag at 11/10/20 2256  . acetaminophen (TYLENOL) tablet 650 mg  650 mg Oral Q6H PRN Reubin Milan, MD       Or  . acetaminophen (TYLENOL) suppository 650 mg  650 mg Rectal Q6H PRN Reubin Milan, MD      . allopurinol (ZYLOPRIM) tablet 100 mg  100 mg Oral Daily Reubin Milan, MD   100 mg at 11/10/20 1021  . cefTRIAXone (ROCEPHIN) 1 g in sodium chloride 0.9 % 100 mL IVPB  1 g Intravenous Q24H Reubin Milan, MD 200 mL/hr at 11/10/20 2055 1 g at 11/10/20 2055  . Chlorhexidine Gluconate Cloth 2 % PADS 6 each  6 each Topical Daily Fritzi Mandes, MD   6 each at 11/11/20 [phone]  . diclofenac Sodium (VOLTAREN) 1 %  topical gel 4 g  4 g Topical Daily Reubin Milan, MD      . loratadine [facility]) tablet 10 mg  10 mg Oral Daily Reubin Milan, MD      . ondansetron [facility]) tablet 4 mg  4 mg Oral Q6H PRN Reubin Milan, MD       Or  . ondansetron [facility]) injection 4 mg  4 mg Intravenous Q6H PRN Reubin Milan, MD      . polyethylene glycol [facility] Floria Raveling) packet 17 g  17 g Oral Daily Reubin Milan, MD      . senna-docusate (Senokot-S) tablet 1-2 tablet  1-2 tablet Oral Daily Reubin Milan, MD      . tamsulosin [facility]) capsule 0.4 mg  0.4 mg Oral QPC supper Janith Lima, MD   0.4 mg at 11/10/20 1735  . traMADol (ULTRAM) tablet 50 mg  50 mg Oral BID PRN Reubin Milan, MD   50 mg at 11/10/20 2054   Objective: Vital signs in last 24 hours: Temp:  [97.8 F (36.6 C)-98.9 F (37.2 C)] 98.9 F (37.2 C) (12/20 0726) Pulse Rate:  [78-95] 95 (12/20 0726) Resp:  [16-18] 18 (12/20 0726) BP: (101-155)/(63-85) 101/85 (12/20 0726) SpO2:  [93 %-94 %] 93 % (12/20 0448)  Intake/Output from previous day: 12/19 0701 - 12/20 0700 In: 1510.4 [  I.V.:1510.4] Out: 650 [Urine:650] Intake/Output this shift: No intake/output data recorded.  Physical Exam Vitals and nursing note reviewed.  Constitutional:      General: He is sleeping.  Pulmonary:     Effort: Pulmonary effort is normal. No respiratory distress.  Skin:    General: Skin is warm and dry.    Lab Results:  Recent Labs    11/09/20 1237 11/10/20 0221  WBC 7.4 6.6  HGB 11.7* 9.5*  HCT 36.2* 29.6*  PLT 200 160   BMET Recent Labs    11/10/20 0221 11/11/20 0523  NA 141 141  K 3.9 4.5  CL 104 105  CO2 27 25  GLUCOSE 94 101*  BUN 57* 40*  CREATININE 2.25* 1.85*  CALCIUM 9.2 9.2   PT/INR Recent Labs    11/10/20 0221 11/11/20 0523  LABPROT 20.8* 17.3*  INR 1.9* 1.5*   Assessment & Plan: 86 year old comorbid male with advanced dementia admitted with gross hematuria in the setting of  supratherapeutic INR, now holding warfarin.  CTAP revealed BPH and a possible bladder mass vs clot, started on tamsulosin.  Gross hematuria now resolved. Family has concerns about proceeding with hematuria workup given his medical comorbidities and dispo.  Recommendations: -Palliative care consult to discuss goals of care -Hold anticoagulation x1 week -Discontinue Foley catheter in ~5 days -Low threshold for stopping tamsulosin due to concerns for orthostasis given frailty  Debroah Loop, PA-C 11/11/2020

## 2020-11-11 NOTE — TOC Transition Note (Addendum)
Transition of Care [facility]) - CM/SW Discharge Note   Patient Details  Name: [name] MRN: [id number] Date of Birth: 1928-12-30  Transition of Care [facility]) CM/SW Contact:  Magnus Ivan, LCSW Phone Number: 11/11/2020, 2:01 PM   Clinical Narrative:   Patient to discharge back to [place] today. Confirmed with Raquel Sarna at [facility]. Raquel Sarna reported COVID test from 12/18 is sufficient. Updated patient's daughter. Asked LPN to call report. LPN reported patient needs EMS for safety reasons. EMS paperwork completed and placed in [place]. [facility] arranged for 3:30 pick up.     Final next level of care: Memory Care Barriers to Discharge: Barriers Resolved   Patient Goals and CMS Choice Patient states their goals for this hospitalization and ongoing recovery are:: return to [place] CMS Medicare.gov Compare Post Acute Care list provided to:: Patient Represenative (must comment) Choice offered to / list presented to : Adult Children  Discharge Placement              Patient [facility] bed at: [facility]   Name of family member notified: Sula Soda (daughter) Patient and family notified of of transfer: 11/11/20  Discharge Plan and Services                                     Social Determinants of Health (SDOH) Interventions     Readmission Risk Interventions No flowsheet data found.

## 2020-11-12 LAB — HEPATITIS B SURFACE ANTIBODY, QUANTITATIVE: Hep B S AB Quant (Post): <3.1 m[IU]/mL — ABNORMAL LOW (ref >9.9)

## 2020-11-13 ENCOUNTER — Telehealth: Payer: Self-pay | Admitting: Internal Medicine

## 2020-11-13 DIAGNOSIS — N390 Urinary tract infection, site not specified: Secondary | ICD-10-CM | POA: Diagnosis not present

## 2020-11-13 DIAGNOSIS — N179 Acute kidney failure, unspecified: Secondary | ICD-10-CM

## 2020-11-13 DIAGNOSIS — R791 Abnormal coagulation profile: Secondary | ICD-10-CM

## 2020-11-13 DIAGNOSIS — I872 Venous insufficiency (chronic) (peripheral): Secondary | ICD-10-CM | POA: Diagnosis not present

## 2020-11-13 DIAGNOSIS — R310 Gross hematuria: Secondary | ICD-10-CM | POA: Diagnosis not present

## 2020-11-13 DIAGNOSIS — F05 Delirium due to known physiological condition: Secondary | ICD-10-CM | POA: Diagnosis not present

## 2020-11-13 DIAGNOSIS — R339 Retention of urine, unspecified: Secondary | ICD-10-CM

## 2020-11-13 NOTE — Telephone Encounter (Signed)
I spoke to [place] and I let her know patient is hospice eligible and Dr.Letvak will be physician of record.

## 2020-11-13 NOTE — Telephone Encounter (Signed)
Please let them know yes---at this point I think he is hospice eligible and I will be physician of record

## 2020-11-13 NOTE — Telephone Encounter (Signed)
Albina Billet from [employer] [employer] called in wanted to see if Dr.Letvak agreed that tavarious freel was deemed as terminally ill to receive hospice care.  Please advise

## 2020-11-14 DIAGNOSIS — I480 Paroxysmal atrial fibrillation: Secondary | ICD-10-CM | POA: Diagnosis not present

## 2020-11-18 ENCOUNTER — Ambulatory Visit: Payer: Self-pay | Admitting: Urology

## 2020-11-20 ENCOUNTER — Inpatient Hospital Stay: Payer: Medicare Other | Admitting: Internal Medicine

## 2020-12-30 DIAGNOSIS — F0150 Vascular dementia without behavioral disturbance: Secondary | ICD-10-CM | POA: Diagnosis not present

## 2020-12-30 DIAGNOSIS — I719 Aortic aneurysm of unspecified site, without rupture: Secondary | ICD-10-CM

## 2020-12-30 DIAGNOSIS — N184 Chronic kidney disease, stage 4 (severe): Secondary | ICD-10-CM | POA: Diagnosis not present

## 2020-12-30 DIAGNOSIS — M159 Polyosteoarthritis, unspecified: Secondary | ICD-10-CM

## 2020-12-30 DIAGNOSIS — I480 Paroxysmal atrial fibrillation: Secondary | ICD-10-CM | POA: Diagnosis not present

## 2020-12-30 DIAGNOSIS — I5032 Chronic diastolic (congestive) heart failure: Secondary | ICD-10-CM | POA: Diagnosis not present

## 2021-01-20 ENCOUNTER — Emergency Department

## 2021-01-20 ENCOUNTER — Emergency Department
Admission: EM | Admit: 2021-01-20 | Discharge: 2021-01-20 | Disposition: A | Discharge status: Home / Self Care | Attending: Emergency Medicine | Admitting: Emergency Medicine

## 2021-01-20 DIAGNOSIS — W19XXXA Unspecified fall, initial encounter: Secondary | ICD-10-CM | POA: Insufficient documentation

## 2021-01-20 DIAGNOSIS — S01312A Laceration without foreign body of left ear, initial encounter: Secondary | ICD-10-CM | POA: Diagnosis not present

## 2021-01-20 DIAGNOSIS — S0991XA Unspecified injury of ear, initial encounter: Secondary | ICD-10-CM | POA: Diagnosis present

## 2021-01-20 DIAGNOSIS — R296 Repeated falls: Secondary | ICD-10-CM

## 2021-01-20 DIAGNOSIS — S0990XA Unspecified injury of head, initial encounter: Secondary | ICD-10-CM | POA: Diagnosis not present

## 2021-01-20 DIAGNOSIS — D631 Anemia in chronic kidney disease: Secondary | ICD-10-CM | POA: Diagnosis not present

## 2021-01-20 DIAGNOSIS — F0150 Vascular dementia without behavioral disturbance: Secondary | ICD-10-CM | POA: Diagnosis not present

## 2021-01-20 DIAGNOSIS — Z91040 Latex allergy status: Secondary | ICD-10-CM | POA: Insufficient documentation

## 2021-01-20 DIAGNOSIS — I5032 Chronic diastolic (congestive) heart failure: Secondary | ICD-10-CM | POA: Insufficient documentation

## 2021-01-20 DIAGNOSIS — N183 Chronic kidney disease, stage 3 unspecified: Secondary | ICD-10-CM | POA: Insufficient documentation

## 2021-01-20 DIAGNOSIS — Z87891 Personal history of nicotine dependence: Secondary | ICD-10-CM | POA: Insufficient documentation

## 2021-01-20 DIAGNOSIS — Z85828 Personal history of other malignant neoplasm of skin: Secondary | ICD-10-CM | POA: Insufficient documentation

## 2021-01-20 DIAGNOSIS — Y92129 Unspecified place in nursing home as the place of occurrence of the external cause: Secondary | ICD-10-CM | POA: Diagnosis not present

## 2021-01-20 NOTE — ED Triage Notes (Signed)
Pt from memory care, staff reports pt was found sitting in bed at 0430 with left ear bleeding with "left earlobe detached posteriorly." pt presents with dressing around ear with bleeding controlled at this time. Denies any blood thinners. No other injuries noted, unknown if pt fell.

## 2021-01-20 NOTE — ED Notes (Signed)
Pt's daughter at bedside who states the facility called her and told her pt had fallen and was found on floor by staff. Reports fall was unwittnessed per staff. Pt's daughter reports pt has hx of dementia however states pt does appear more confused than normal.

## 2021-01-20 NOTE — ED Notes (Signed)
Report given to [facility] at [facility]

## 2021-01-20 NOTE — ED Provider Notes (Signed)
[facility] Emergency Department Provider Note  ____________________________________________   Event Date/Time   First MD Initiated Contact with Patient 01/20/21 [phone]     (approximate)  I have reviewed the triage vital signs and the nursing notes.   HISTORY  Chief Complaint Ear Injury  Level 5 caveat:  history/ROS limited by chronic dementia  HPI [name] is a 87 y.o. male with history of advanced dementia and extensive other medical issues as listed below.  His daughter is at bedside and provides the history.  She also confirms that the patient is DNR/DNI and that they do not want to proceed with any aggressive treatments regardless of the diagnosis.  The patient reportedly had an unwitnessed fall at his nursing facility.  He was transferred because of the fall and head injury with a reported laceration or "detachment" of the patient's ear on the left side.  He reports that his head hurts.  He denies neck pain.  He does not remember what happened.  He denies chest pain or shortness of breath as well as abdominal pain.  He has not been ill recently.  Of note his daughter said that he was diagnosed with a urinary tract infection at the past and it was recommended that they follow-up with urology but the family declined follow-up.  Similarly the patient has a known abdominal aortic aneurysm that has reportedly doubled in size but they also declined further evaluation or treatment of that issue.         Past Medical History:  Diagnosis Date  . AAA (abdominal aortic aneurysm) without rupture ([place]) 03/03/2013   Small, partially thrombosed saccular aneurysm  . Arthritis    HANDS  . Atrial fibrillation ([place]) 02/10/2013  . Cancer ([place])    SKIN CANCERS  . Chronic diastolic congestive heart failure ([place]) 03/20/2013  . Chronic kidney disease, stage III (moderate) (HCC)   . Constipation   . Erectile dysfunction   . Hearing loss    BILATERAL - WEARS HEARING  AIDS  . History of torn meniscus of left knee    PAINFUL LEFT KNEE  . Mitral valve prolapse   . Mitral valve regurgitation   . OSA on CPAP   . S/P Maze operation for atrial fibrillation 03/15/2013   Complete bilateral lesion set using cryothermy and bipolar radiofrequency ablation with oversewing of LA appendage via right mini thoracotomy  . Spinal stenosis, thoracic 03/03/2013   T5-6    Patient Active Problem List   Diagnosis Date Noted  . Acute urinary retention   . Pressure injury of skin 11/10/2020  . AKI (acute kidney injury) ([place]) 11/09/2020  . Hematuria 11/09/2020  . Vascular dementia without behavioral disturbance ([place]) 02/08/2020  . Thrombosis of saphenous vein, right 01/12/2019  . Anemia of chronic renal failure, stage 3 (moderate) ([place]) 10/14/2018  . Gout 10/06/2017  . Osteoarthrosis involving multiple sites 09/10/2016  . Constipation 06/11/2016  . Chronic kidney disease, stage III (moderate) (HCC)   . S/P MVR (mitral valve repair) 03/28/2015  . Encounter for therapeutic drug monitoring 12/26/2013  . OSA on CPAP   . Chronic diastolic congestive heart failure ([place]) 03/20/2013  . S/P Maze operation for atrial fibrillation 03/15/2013  . MR (mitral regurgitation) 03/03/2013  . PAF (paroxysmal atrial fibrillation) ([place]) 03/03/2013  . Spinal stenosis, thoracic 03/03/2013  . AAA (abdominal aortic aneurysm) without rupture ([place]) 03/03/2013  . Mitral valve prolapse, nonrheumatic 01/24/2013    Past Surgical History:  Procedure Laterality Date  .  CARPAL TUNNEL RELEASE     BILATERAL  . [place]  . INTRAOPERATIVE TRANSESOPHAGEAL ECHOCARDIOGRAM Right 03/15/2013   Procedure: INTRAOPERATIVE TRANSESOPHAGEAL ECHOCARDIOGRAM;  Surgeon: Rexene Alberts, MD;  Location: [place];  Service: Open Heart Surgery;  Laterality: Right;  . MINIMALLY INVASIVE MAZE PROCEDURE Right 03/15/2013   Procedure: MINIMALLY INVASIVE MAZE PROCEDURE;  Surgeon: Rexene Alberts, MD;  Location:  [place];  Service: Open Heart Surgery;  Laterality: Right;  (R) AXILLARY CANNULATION  . MITRAL VALVE REPAIR Right 03/15/2013   Procedure: MINIMALLY INVASIVE MITRAL VALVE REPAIR (MVR);  Surgeon: Rexene Alberts, MD;  Location: [place];  Service: Open Heart Surgery;  Laterality: Right;  . RIGHT ROTATOR CUFF REPAIR    . SKIN CANCERS REMOVED FROM BOTH LEGS    . SKIN CANCERS REMOVED FROM LEFT FOREHEAD AND LEFT NOSE    . SURGERY LEFT NECK FOR REMOVAL OF METASTATIC LYMPH NODE ( FROM CANCER LEFT FOREHEAD)  AND LEFT NECK DISSECTION-REST OF LYMPH NODES NEGATIVE FOR ANY CANCER    . TEE WITHOUT CARDIOVERSION N/A 01/31/2013   Procedure: TRANSESOPHAGEAL ECHOCARDIOGRAM (TEE);  Surgeon: Sueanne Margarita, MD;  Location: [facility] ENDOSCOPY;  Service: Cardiovascular;  Laterality: N/A;    Prior to Admission medications   Medication Sig Start Date End Date Taking? Authorizing Provider  acetaminophen (TYLENOL) 650 MG CR tablet Take 650 mg by mouth 3 (three) times daily.    [provider]  allopurinol (ZYLOPRIM) 100 MG tablet Take 100 mg by mouth daily.    [provider]  cetirizine (ZYRTEC) 10 MG tablet Take 10 mg by mouth at bedtime.    [provider]  cholecalciferol (VITAMIN D) 1000 UNITS tablet Take 1,000 Units by mouth every morning. Reported on 03/19/2016    [provider]  cyanocobalamin (,VITAMIN B-12,) 1000 MCG/ML injection Inject 1,000 mcg into the muscle every 14 (fourteen) days.     [provider]  diclofenac Sodium (VOLTAREN) 1 % GEL Apply 4 g topically daily. Apply to right hip area prn for hip pain    [provider]  polyethylene glycol (MIRALAX / GLYCOLAX) packet Take 17 g by mouth daily.    [provider]  senna-docusate (SENOKOT-S) 8.6-50 MG tablet Take 2 tablets by mouth daily. Patient taking differently: Take 1-2 tablets by mouth daily. One tab in the am  2 tabs in the pm 09/10/16   Venia Carbon, MD  spironolactone (ALDACTONE) 25 MG  tablet Take 25 mg by mouth 2 (two) times daily. 09/16/20   [provider]  tamsulosin (FLOMAX) 0.4 MG CAPS capsule Take 1 capsule (0.4 mg total) by mouth daily after supper. 11/11/20   Fritzi Mandes, MD  traMADol (ULTRAM) 50 MG tablet Take 50 mg by mouth 2 (two) times daily as needed. 07/01/20   [provider]  triamcinolone (KENALOG) 0.1 % Apply 1 application topically in the morning and at bedtime. 10/15/20   [provider]    Allergies Statins and Latex  Family History  Problem Relation Age of Onset  . Coronary artery disease Father   . Heart disease Father   . Heart attack Father   . Diabetes Mother   . Arthritis Mother   . Congestive Heart Failure Mother   . Alzheimer's disease Mother   . Alzheimer's disease Sister     Social History Social History   Tobacco Use  . Smoking status: Former Smoker    Packs/day: 1.00    Years: 20.00  Pack years: 20.00    Start date: 11/23/1960  . Smokeless tobacco: Never Used  . Tobacco comment: QUIT SMOKING 1962  Substance Use Topics  . Alcohol use: Yes    Alcohol/week: 7.0 standard drinks    Types: 7 Standard drinks or equivalent per week    Comment: Cocktail or glass of wine with dinner  . Drug use: No    Review of Systems Level 5 caveat:  history/ROS limited by chronic dementia   ____________________________________________   PHYSICAL EXAM:  VITAL SIGNS: ED Triage Vitals  Enc Vitals Group     BP 01/20/21 0553 120/90     Pulse Rate 01/20/21 0549 94     Resp 01/20/21 0549 14     Temp 01/20/21 0555 98 F (36.7 C)     Temp Source 01/20/21 0555 Oral     SpO2 01/20/21 0549 100 %     Weight --      Height 01/20/21 0549 1.727 m (5\' 8" )     Head Circumference --      Peak Flow --      Pain Score --      Pain Loc --      Pain Edu? --      Excl. in [place]? --     Constitutional: Resting, awakens to loud voice and touch.  Oriented to self. Eyes: Conjunctivae are normal.  Head: Patient is currently  wrapped in a pressure bandage around his left ear.  Deferring removal of the bandage till after imaging. Nose: No epistaxis. Mouth/Throat:  Neck: No stridor.  No meningeal signs.   Cardiovascular: Normal rate, regular rhythm. Good peripheral circulation. Respiratory: Normal respiratory effort.  No retractions. Gastrointestinal: Soft and nontender. No distention.  Musculoskeletal: No lower extremity tenderness nor edema. No gross deformities of extremities. Neurologic:  Normal speech and language. No gross focal neurologic deficits are appreciated.  Skin:  Skin is warm, dry and intact except for left ear which will be documented separately.   ____________________________________________   LABS (all labs ordered are listed, but only abnormal results are displayed)  Labs Reviewed - No data to display ____________________________________________  EKG  ED ECG REPORT I, Hinda Kehr, the attending physician, personally viewed and interpreted this ECG.  Date: 01/20/2021 EKG Time: 6:00 AM Rate: 97 Rhythm: Atrial fibrillation QRS Axis: normal Intervals: Right bundle branch block, also abnormal due to A. fib. ST/T Wave abnormalities: Non-specific ST segment / T-wave changes, but no clear evidence of acute ischemia. Narrative Interpretation: no definitive evidence of acute ischemia; does not meet STEMI criteria.   ____________________________________________  RADIOLOGY CT scans are pending at the time of signout to Dr. Cheri Fowler.   ____________________________________________   PROCEDURES   Procedure(s) performed (including Critical Care):  Procedures   ____________________________________________   INITIAL IMPRESSION / MDM / ASSESSMENT AND PLAN / ED COURSE  As part of my medical decision making, I reviewed the following data within the [place] History obtained from family, Nursing notes reviewed and incorporated, Old chart reviewed and Notes from prior  ED visits   Differential diagnosis includes, but is not limited to, ear injury, skull fracture, intracranial bleed, cervical spine fracture.  I had a long conversation with the patient's daughter who confirmed his dementia and is DNR and the family's wishes to not pursue any aggressive care.  She agrees with the plan to not check any lab work or urinalysis given that he is otherwise at his baseline and appears to have had a mechanical  fall.  She agrees with the plan for head and cervical spine CTs because the presence of an intracranial bleed or cervical spine injury, for example, as important prognostic considerations even if the family chooses not to pursue aggressive care.  Given the concern for uncontrolled bleeding if the bandages were removed, I elected to leave a pressure dressing in place on the patient's left ear until after the imaging.  At the time of transfer of care to Dr. Cheri Fowler, the CT scans are pending and the patient's ear will need to be assessed in terms of whether it can be sutured or whether specialty (ENT) care will be needed.  The patient's daughter understands the plan of care as well.           ____________________________________________  FINAL CLINICAL IMPRESSION(S) / ED DIAGNOSES  Final diagnoses:  Ear lobe laceration, left, initial encounter  Unwitnessed fall  Injury of head, initial encounter     MEDICATIONS GIVEN DURING THIS VISIT:  Medications - No data to display   ED Discharge Orders    None      *Please note:  [name] was evaluated in Emergency Department on 01/20/2021 for the symptoms described in the history of present illness. He was evaluated in the context of the global COVID-19 pandemic, which necessitated consideration that the patient might be at risk for infection with the SARS-CoV-2 virus that causes COVID-19. Institutional protocols and algorithms that pertain to the evaluation of patients at risk for COVID-19 are in a state of  rapid change based on information released by regulatory bodies including the CDC and federal and state organizations. These policies and algorithms were followed during the patient's care in the ED.  Some ED evaluations and interventions may be delayed as a result of limited staffing during and after the pandemic.*  Note:  This document was prepared using Dragon voice recognition software and may include unintentional dictation errors.   Hinda Kehr, MD 01/20/21 [phone]

## 2021-01-22 DIAGNOSIS — S01312A Laceration without foreign body of left ear, initial encounter: Secondary | ICD-10-CM

## 2021-01-22 DIAGNOSIS — Y92129 Unspecified place in nursing home as the place of occurrence of the external cause: Secondary | ICD-10-CM

## 2021-01-27 DIAGNOSIS — I5030 Unspecified diastolic (congestive) heart failure: Secondary | ICD-10-CM | POA: Diagnosis not present

## 2021-01-27 DIAGNOSIS — M1000 Idiopathic gout, unspecified site: Secondary | ICD-10-CM | POA: Diagnosis not present

## 2021-01-27 DIAGNOSIS — I4891 Unspecified atrial fibrillation: Secondary | ICD-10-CM | POA: Diagnosis not present

## 2021-01-27 DIAGNOSIS — N184 Chronic kidney disease, stage 4 (severe): Secondary | ICD-10-CM | POA: Diagnosis not present

## 2021-01-27 DIAGNOSIS — F0151 Vascular dementia with behavioral disturbance: Secondary | ICD-10-CM

## 2021-02-03 DIAGNOSIS — L0231 Cutaneous abscess of buttock: Secondary | ICD-10-CM

## 2021-03-07 DIAGNOSIS — F0150 Vascular dementia without behavioral disturbance: Secondary | ICD-10-CM

## 2021-03-07 DIAGNOSIS — I5030 Unspecified diastolic (congestive) heart failure: Secondary | ICD-10-CM

## 2021-03-07 DIAGNOSIS — I4891 Unspecified atrial fibrillation: Secondary | ICD-10-CM | POA: Diagnosis not present

## 2021-03-07 DIAGNOSIS — M1000 Idiopathic gout, unspecified site: Secondary | ICD-10-CM

## 2021-03-07 DIAGNOSIS — M4804 Spinal stenosis, thoracic region: Secondary | ICD-10-CM

## 2021-03-07 DIAGNOSIS — I719 Aortic aneurysm of unspecified site, without rupture: Secondary | ICD-10-CM

## 2021-03-07 DIAGNOSIS — M1990 Unspecified osteoarthritis, unspecified site: Secondary | ICD-10-CM

## 2021-03-07 DIAGNOSIS — N184 Chronic kidney disease, stage 4 (severe): Secondary | ICD-10-CM | POA: Diagnosis not present

## 2021-03-15 ENCOUNTER — Emergency Department
Admission: EM | Admit: 2021-03-15 | Discharge: 2021-03-15 | Disposition: A | Discharge status: Home / Self Care | Attending: Emergency Medicine | Admitting: Emergency Medicine

## 2021-03-15 ENCOUNTER — Other Ambulatory Visit: Payer: Self-pay

## 2021-03-15 DIAGNOSIS — I5032 Chronic diastolic (congestive) heart failure: Secondary | ICD-10-CM | POA: Insufficient documentation

## 2021-03-15 DIAGNOSIS — X58XXXA Exposure to other specified factors, initial encounter: Secondary | ICD-10-CM | POA: Insufficient documentation

## 2021-03-15 DIAGNOSIS — S8991XA Unspecified injury of right lower leg, initial encounter: Secondary | ICD-10-CM | POA: Diagnosis present

## 2021-03-15 DIAGNOSIS — Z85828 Personal history of other malignant neoplasm of skin: Secondary | ICD-10-CM | POA: Diagnosis not present

## 2021-03-15 DIAGNOSIS — Z87891 Personal history of nicotine dependence: Secondary | ICD-10-CM | POA: Diagnosis not present

## 2021-03-15 DIAGNOSIS — Z91040 Latex allergy status: Secondary | ICD-10-CM | POA: Diagnosis not present

## 2021-03-15 DIAGNOSIS — N183 Chronic kidney disease, stage 3 unspecified: Secondary | ICD-10-CM | POA: Diagnosis not present

## 2021-03-15 DIAGNOSIS — S81811A Laceration without foreign body, right lower leg, initial encounter: Secondary | ICD-10-CM | POA: Insufficient documentation

## 2021-03-15 NOTE — ED Triage Notes (Signed)
Pt brought in POV from [facility] c/o of skin tear on r shin. Pt presents with wrapped bandage on skin tear with steri strip placed from facility, bleeding controlled at this time.

## 2021-03-15 NOTE — ED Notes (Signed)
EDP at bedside, cleaning pt's wound and apply dressing at this time.

## 2021-03-15 NOTE — ED Notes (Signed)
This RN attempted to call report at number listed from initial note of encounter and received no answer.

## 2021-03-15 NOTE — ED Provider Notes (Signed)
[facility] Emergency Department Provider Note   ____________________________________________   Event Date/Time   First MD Initiated Contact with Patient 03/15/21 [phone]     (approximate)  I have reviewed the triage vital signs and the nursing notes.   HISTORY  Chief Complaint Extremity Laceration (Skin tear on R shin)    HPI [name] is a 87 y.o. male with the below stated past medical history presents from [facility] memory care facility after being found in the hallway with a walker and a laceration to the right shin.  Unknown how the laceration occurred.  Patient unable to give any further history of review of systems given mental status which caregiver at bedside states is at baseline.         Past Medical History:  Diagnosis Date  . AAA (abdominal aortic aneurysm) without rupture ([place]) 03/03/2013   Small, partially thrombosed saccular aneurysm  . Arthritis    HANDS  . Atrial fibrillation ([place]) 02/10/2013  . Cancer ([place])    SKIN CANCERS  . Chronic diastolic congestive heart failure ([place]) 03/20/2013  . Chronic kidney disease, stage III (moderate) (HCC)   . Constipation   . Erectile dysfunction   . Hearing loss    BILATERAL - WEARS HEARING AIDS  . History of torn meniscus of left knee    PAINFUL LEFT KNEE  . Mitral valve prolapse   . Mitral valve regurgitation   . OSA on CPAP   . S/P Maze operation for atrial fibrillation 03/15/2013   Complete bilateral lesion set using cryothermy and bipolar radiofrequency ablation with oversewing of LA appendage via right mini thoracotomy  . Spinal stenosis, thoracic 03/03/2013   T5-6    Patient Active Problem List   Diagnosis Date Noted  . Acute urinary retention   . Pressure injury of skin 11/10/2020  . AKI (acute kidney injury) ([place]) 11/09/2020  . Hematuria 11/09/2020  . Vascular dementia without behavioral disturbance ([place]) 02/08/2020  . Thrombosis of saphenous vein, right 01/12/2019  .  Anemia of chronic renal failure, stage 3 (moderate) ([place]) 10/14/2018  . Gout 10/06/2017  . Osteoarthrosis involving multiple sites 09/10/2016  . Constipation 06/11/2016  . Chronic kidney disease, stage III (moderate) (HCC)   . S/P MVR (mitral valve repair) 03/28/2015  . Encounter for therapeutic drug monitoring 12/26/2013  . OSA on CPAP   . Chronic diastolic congestive heart failure ([place]) 03/20/2013  . S/P Maze operation for atrial fibrillation 03/15/2013  . MR (mitral regurgitation) 03/03/2013  . PAF (paroxysmal atrial fibrillation) ([place]) 03/03/2013  . Spinal stenosis, thoracic 03/03/2013  . AAA (abdominal aortic aneurysm) without rupture ([place]) 03/03/2013  . Mitral valve prolapse, nonrheumatic 01/24/2013    Past Surgical History:  Procedure Laterality Date  . CARPAL TUNNEL RELEASE     BILATERAL  . [place]  . INTRAOPERATIVE TRANSESOPHAGEAL ECHOCARDIOGRAM Right 03/15/2013   Procedure: INTRAOPERATIVE TRANSESOPHAGEAL ECHOCARDIOGRAM;  Surgeon: Rexene Alberts, MD;  Location: [place];  Service: Open Heart Surgery;  Laterality: Right;  . MINIMALLY INVASIVE MAZE PROCEDURE Right 03/15/2013   Procedure: MINIMALLY INVASIVE MAZE PROCEDURE;  Surgeon: Rexene Alberts, MD;  Location: [place];  Service: Open Heart Surgery;  Laterality: Right;  (R) AXILLARY CANNULATION  . MITRAL VALVE REPAIR Right 03/15/2013   Procedure: MINIMALLY INVASIVE MITRAL VALVE REPAIR (MVR);  Surgeon: Rexene Alberts, MD;  Location: [place];  Service: Open Heart Surgery;  Laterality: Right;  . RIGHT ROTATOR CUFF REPAIR    .  SKIN CANCERS REMOVED FROM BOTH LEGS    . SKIN CANCERS REMOVED FROM LEFT FOREHEAD AND LEFT NOSE    . SURGERY LEFT NECK FOR REMOVAL OF METASTATIC LYMPH NODE ( FROM CANCER LEFT FOREHEAD)  AND LEFT NECK DISSECTION-REST OF LYMPH NODES NEGATIVE FOR ANY CANCER    . TEE WITHOUT CARDIOVERSION N/A 01/31/2013   Procedure: TRANSESOPHAGEAL ECHOCARDIOGRAM (TEE);  Surgeon: Sueanne Margarita, MD;  Location: [facility]  ENDOSCOPY;  Service: Cardiovascular;  Laterality: N/A;    Prior to Admission medications   Medication Sig Start Date End Date Taking? Authorizing Provider  acetaminophen (TYLENOL) 650 MG CR tablet Take 650 mg by mouth 3 (three) times daily.    [provider]  allopurinol (ZYLOPRIM) 100 MG tablet Take 100 mg by mouth daily.    [provider]  cetirizine (ZYRTEC) 10 MG tablet Take 10 mg by mouth at bedtime.    [provider]  cholecalciferol (VITAMIN D) 1000 UNITS tablet Take 1,000 Units by mouth every morning. Reported on 03/19/2016    [provider]  cyanocobalamin (,VITAMIN B-12,) 1000 MCG/ML injection Inject 1,000 mcg into the muscle every 14 (fourteen) days.     [provider]  diclofenac Sodium (VOLTAREN) 1 % GEL Apply 4 g topically daily. Apply to right hip area prn for hip pain    [provider]  polyethylene glycol (MIRALAX / GLYCOLAX) packet Take 17 g by mouth daily.    [provider]  senna-docusate (SENOKOT-S) 8.6-50 MG tablet Take 2 tablets by mouth daily. Patient taking differently: Take 1-2 tablets by mouth daily. One tab in the am  2 tabs in the pm 09/10/16   Venia Carbon, MD  spironolactone (ALDACTONE) 25 MG tablet Take 25 mg by mouth 2 (two) times daily. 09/16/20   [provider]  tamsulosin (FLOMAX) 0.4 MG CAPS capsule Take 1 capsule (0.4 mg total) by mouth daily after supper. 11/11/20   Fritzi Mandes, MD  traMADol (ULTRAM) 50 MG tablet Take 50 mg by mouth 2 (two) times daily as needed. 07/01/20   [provider]  triamcinolone (KENALOG) 0.1 % Apply 1 application topically in the morning and at bedtime. 10/15/20   [provider]    Allergies Statins and Latex  Family History  Problem Relation Age of Onset  . Coronary artery disease Father   . Heart disease Father   . Heart attack Father   . Diabetes Mother   . Arthritis Mother   . Congestive Heart Failure Mother   .  Alzheimer's disease Mother   . Alzheimer's disease Sister     Social History Social History   Tobacco Use  . Smoking status: Former Smoker    Packs/day: 1.00    Years: 20.00    Pack years: 20.00    Start date: 11/23/1960  . Smokeless tobacco: Never Used  . Tobacco comment: QUIT SMOKING 1962  Substance Use Topics  . Alcohol use: Yes    Alcohol/week: 7.0 standard drinks    Types: 7 Standard drinks or equivalent per week    Comment: Cocktail or glass of wine with dinner  . Drug use: No    Review of Systems Unable to assess ____________________________________________   PHYSICAL EXAM:  VITAL SIGNS: ED Triage Vitals  Enc Vitals Group     BP      Pulse      Resp      Temp      Temp src      SpO2  Weight      Height      Head Circumference      Peak Flow      Pain Score      Pain Loc      Pain Edu?      Excl. in [place]?    Constitutional: Alert and oriented. Well appearing and in no acute distress. Eyes: Conjunctivae are normal. PERRL. Head: Atraumatic. Nose: No congestion/rhinnorhea. Mouth/Throat: Mucous membranes are moist. Neck: No stridor Cardiovascular: Grossly normal heart sounds.  Good peripheral circulation. Respiratory: Normal respiratory effort.  No retractions. Gastrointestinal: Soft and nontender. No distention. Musculoskeletal: No obvious deformities Neurologic:  Normal speech and language. No gross focal neurologic deficits are appreciated. Skin:  Skin is warm and dry.  Approximately 5 cm V shaped laceration/skin tear to the right anterior shin with Steri-Strips in place and hemostatic Psychiatric: Mood and affect are normal. Speech and behavior are normal.   PROCEDURES  Procedure(s) performed (including Critical Care):  Marland KitchenMarland KitchenLaceration Repair  Date/Time: 03/15/2021 5:10 AM Performed by: Naaman Plummer, MD Authorized by: Naaman Plummer, MD   Consent:    Consent obtained:  Verbal   Consent given by:  Guardian   Risks, benefits, and  alternatives were discussed: yes     Risks discussed:  Infection and need for additional repair   Alternatives discussed:  No treatment Universal protocol:    Immediately prior to procedure, a time out was called: yes     Patient identity confirmed:  Arm band Anesthesia:    Anesthesia method:  None Laceration details:    Location:  Leg   Leg location:  R lower leg   Length (cm):  5   Depth (mm):  3 Pre-procedure details:    Preparation:  Patient was prepped and draped in usual sterile fashion Exploration:    Limited defect created (wound extended): no     Hemostasis achieved with:  Direct pressure   Imaging outcome: foreign body not noted     Wound exploration: entire depth of wound visualized     Contaminated: no   Treatment:    Area cleansed with:  Povidone-iodine   Amount of cleaning:  Standard   Irrigation solution:  Sterile saline   Irrigation volume:  200   Irrigation method:  Pressure wash   Visualized foreign bodies/material removed: no     Debridement:  None   Undermining:  None   Scar revision: no   Skin repair:    Repair method:  Steri-Strips and tissue adhesive   Number of Steri-Strips:  3 Approximation:    Approximation:  Close Repair type:    Repair type:  Simple Post-procedure details:    Dressing:  Non-adherent dressing   Procedure completion:  Tolerated     ____________________________________________   INITIAL IMPRESSION / ASSESSMENT AND PLAN / ED COURSE  As part of my medical decision making, I reviewed the following data within the [place] notes reviewed and incorporated, Old chart reviewed, and Notes from prior ED visits reviewed and incorporated        Patient had a laceration that was repaired in the ED after copious irrigation.  Please see laceration procedure note for further details.  After exploration of the wound, there was no evidence of a retained foreign body. No evidence of underlying fracture. TDAP:  UTD Interventions: Defer ABX at this time given location, event time, and patient without surrounding signs of infection. Disposition: Discharge. Patient has been given strict wound return  precautions and instructions to follow up with their PMD in 2 days for a wound recheck.      ____________________________________________   FINAL CLINICAL IMPRESSION(S) / ED DIAGNOSES  Final diagnoses:  None     ED Discharge Orders    None       Note:  This document was prepared using Dragon voice recognition software and may include unintentional dictation errors.   Naaman Plummer, MD 03/15/21 [phone]

## 2021-03-15 NOTE — ED Notes (Signed)
Discharge instructions given to pt's family.

## 2021-03-15 NOTE — ED Notes (Signed)
[facility] REPORT Duanne Guess)   Pt found in hallway with walker, cup in hand and right leg bleeding at approx 0203 . Unknown how laceration found on the right leg occurred.    Call to give report  [phone]  [phone]

## 2021-03-19 DIAGNOSIS — B351 Tinea unguium: Secondary | ICD-10-CM | POA: Diagnosis not present

## 2021-04-02 DIAGNOSIS — S81801A Unspecified open wound, right lower leg, initial encounter: Secondary | ICD-10-CM | POA: Diagnosis not present

## 2021-04-02 DIAGNOSIS — I5030 Unspecified diastolic (congestive) heart failure: Secondary | ICD-10-CM | POA: Diagnosis not present

## 2021-05-02 DIAGNOSIS — M79675 Pain in left toe(s): Secondary | ICD-10-CM | POA: Diagnosis not present

## 2021-05-02 DIAGNOSIS — S90931A Unspecified superficial injury of right great toe, initial encounter: Secondary | ICD-10-CM | POA: Diagnosis not present

## 2021-05-02 DIAGNOSIS — M79674 Pain in right toe(s): Secondary | ICD-10-CM | POA: Diagnosis not present

## 2021-05-02 DIAGNOSIS — B351 Tinea unguium: Secondary | ICD-10-CM | POA: Diagnosis not present

## 2021-05-05 DIAGNOSIS — N184 Chronic kidney disease, stage 4 (severe): Secondary | ICD-10-CM

## 2021-05-05 DIAGNOSIS — F39 Unspecified mood [affective] disorder: Secondary | ICD-10-CM | POA: Diagnosis not present

## 2021-05-05 DIAGNOSIS — F0151 Vascular dementia with behavioral disturbance: Secondary | ICD-10-CM | POA: Diagnosis not present

## 2021-05-05 DIAGNOSIS — I5032 Chronic diastolic (congestive) heart failure: Secondary | ICD-10-CM | POA: Diagnosis not present

## 2021-05-05 DIAGNOSIS — I780 Hereditary hemorrhagic telangiectasia: Secondary | ICD-10-CM | POA: Diagnosis not present

## 2021-05-12 DIAGNOSIS — F39 Unspecified mood [affective] disorder: Secondary | ICD-10-CM

## 2021-07-16 DIAGNOSIS — F39 Unspecified mood [affective] disorder: Secondary | ICD-10-CM | POA: Diagnosis not present

## 2021-07-16 DIAGNOSIS — I714 Abdominal aortic aneurysm, without rupture: Secondary | ICD-10-CM

## 2021-07-16 DIAGNOSIS — M4804 Spinal stenosis, thoracic region: Secondary | ICD-10-CM

## 2021-07-16 DIAGNOSIS — I4891 Unspecified atrial fibrillation: Secondary | ICD-10-CM | POA: Diagnosis not present

## 2021-07-16 DIAGNOSIS — N184 Chronic kidney disease, stage 4 (severe): Secondary | ICD-10-CM

## 2021-07-16 DIAGNOSIS — M1990 Unspecified osteoarthritis, unspecified site: Secondary | ICD-10-CM

## 2021-07-16 DIAGNOSIS — I5030 Unspecified diastolic (congestive) heart failure: Secondary | ICD-10-CM | POA: Diagnosis not present

## 2021-07-16 DIAGNOSIS — N401 Enlarged prostate with lower urinary tract symptoms: Secondary | ICD-10-CM

## 2021-07-16 DIAGNOSIS — M1000 Idiopathic gout, unspecified site: Secondary | ICD-10-CM

## 2021-07-16 DIAGNOSIS — F0150 Vascular dementia without behavioral disturbance: Secondary | ICD-10-CM | POA: Diagnosis not present

## 2021-09-29 DIAGNOSIS — S66901A Unspecified injury of unspecified muscle, fascia and tendon at wrist and hand level, right hand, initial encounter: Secondary | ICD-10-CM | POA: Diagnosis not present

## 2021-10-31 DIAGNOSIS — I714 Abdominal aortic aneurysm, without rupture, unspecified: Secondary | ICD-10-CM

## 2021-10-31 DIAGNOSIS — M1000 Idiopathic gout, unspecified site: Secondary | ICD-10-CM

## 2021-10-31 DIAGNOSIS — F39 Unspecified mood [affective] disorder: Secondary | ICD-10-CM

## 2021-10-31 DIAGNOSIS — I4891 Unspecified atrial fibrillation: Secondary | ICD-10-CM

## 2021-10-31 DIAGNOSIS — M4804 Spinal stenosis, thoracic region: Secondary | ICD-10-CM

## 2021-10-31 DIAGNOSIS — N401 Enlarged prostate with lower urinary tract symptoms: Secondary | ICD-10-CM

## 2021-10-31 DIAGNOSIS — I5030 Unspecified diastolic (congestive) heart failure: Secondary | ICD-10-CM | POA: Diagnosis not present

## 2021-10-31 DIAGNOSIS — N184 Chronic kidney disease, stage 4 (severe): Secondary | ICD-10-CM | POA: Diagnosis not present

## 2021-10-31 DIAGNOSIS — F0150 Vascular dementia without behavioral disturbance: Secondary | ICD-10-CM | POA: Diagnosis not present

## 2021-10-31 DIAGNOSIS — M1990 Unspecified osteoarthritis, unspecified site: Secondary | ICD-10-CM | POA: Diagnosis not present

## 2021-12-17 DIAGNOSIS — I872 Venous insufficiency (chronic) (peripheral): Secondary | ICD-10-CM | POA: Diagnosis not present

## 2021-12-17 DIAGNOSIS — S81802A Unspecified open wound, left lower leg, initial encounter: Secondary | ICD-10-CM | POA: Diagnosis not present

## 2021-12-25 ENCOUNTER — Other Ambulatory Visit: Payer: Self-pay

## 2021-12-31 DIAGNOSIS — R21 Rash and other nonspecific skin eruption: Secondary | ICD-10-CM | POA: Diagnosis not present

## 2022-01-21 DEATH — deceased
